# Patient Record
Sex: Female | Born: 1985 | Race: Black or African American | Hispanic: No | Marital: Married | Smoking: Never smoker
Health system: Southern US, Community
[De-identification: ages and names within clinical notes are randomized; demographics above are authoritative.]

## PROBLEM LIST (undated history)

## (undated) ENCOUNTER — Inpatient Hospital Stay (HOSPITAL_COMMUNITY): Payer: Self-pay

## (undated) DIAGNOSIS — O09819 Supervision of pregnancy resulting from assisted reproductive technology, unspecified trimester: Secondary | ICD-10-CM

## (undated) DIAGNOSIS — I10 Essential (primary) hypertension: Secondary | ICD-10-CM

## (undated) DIAGNOSIS — E785 Hyperlipidemia, unspecified: Secondary | ICD-10-CM

## (undated) DIAGNOSIS — D649 Anemia, unspecified: Secondary | ICD-10-CM

## (undated) DIAGNOSIS — I2699 Other pulmonary embolism without acute cor pulmonale: Secondary | ICD-10-CM

## (undated) DIAGNOSIS — I82409 Acute embolism and thrombosis of unspecified deep veins of unspecified lower extremity: Secondary | ICD-10-CM

## (undated) HISTORY — DX: Essential (primary) hypertension: I10

## (undated) HISTORY — DX: Acute embolism and thrombosis of unspecified deep veins of unspecified lower extremity: I82.409

## (undated) HISTORY — DX: Supervision of pregnancy resulting from assisted reproductive technology, unspecified trimester: O09.819

## (undated) HISTORY — DX: Hyperlipidemia, unspecified: E78.5

## (undated) HISTORY — PX: CHOLECYSTECTOMY: SHX55

## (undated) HISTORY — DX: Other pulmonary embolism without acute cor pulmonale: I26.99

---

## 2014-07-17 ENCOUNTER — Ambulatory Visit (INDEPENDENT_AMBULATORY_CARE_PROVIDER_SITE_OTHER): Payer: BC Managed Care – PPO | Admitting: Family Medicine

## 2014-07-17 VITALS — BP 139/85 | HR 91 | Temp 98.4°F | Resp 16 | Ht 65.5 in | Wt 164.6 lb

## 2014-07-17 DIAGNOSIS — Z113 Encounter for screening for infections with a predominantly sexual mode of transmission: Secondary | ICD-10-CM

## 2014-07-17 DIAGNOSIS — N898 Other specified noninflammatory disorders of vagina: Secondary | ICD-10-CM

## 2014-07-17 DIAGNOSIS — Z124 Encounter for screening for malignant neoplasm of cervix: Secondary | ICD-10-CM

## 2014-07-17 LAB — POCT WET PREP WITH KOH
KOH Prep POC: NEGATIVE
Trichomonas, UA: NEGATIVE
Yeast Wet Prep HPF POC: NEGATIVE

## 2014-07-17 NOTE — Progress Notes (Signed)
Chief Complaint:  Chief Complaint  Patient presents with  . Gynecologic Exam    possible skin tag    HPI: Melissa Ruiz is a 28 y.o. female who is here for  Pap Last pap was 1 year ago No abnormal pap G0 Sexually active, sometimes use condoms No sexually transmitted disease No vaginal discahrge, she ahs "something in her vagina" about 2 days ago, her boyfriend noticed it.  She is doing well, no sxs She is in amonagamous relationship   No past medical history on file. No past surgical history on file. History   Social History  . Marital Status: Married    Spouse Name: N/A    Number of Children: N/A  . Years of Education: N/A   Social History Main Topics  . Smoking status: Never Smoker   . Smokeless tobacco: None  . Alcohol Use: 0.0 oz/week    0 Not specified per week  . Drug Use: No  . Sexual Activity: Yes   Other Topics Concern  . None   Social History Narrative  . None   No family history on file. No Known Allergies Prior to Admission medications   Not on File     ROS: The patient denies fevers, chills, night sweats, unintentional weight loss, chest pain, palpitations, wheezing, dyspnea on exertion, nausea, vomiting, abdominal pain, dysuria, hematuria, melena, numbness, weakness, or tingling.   All other systems have been reviewed and were otherwise negative with the exception of those mentioned in the HPI and as above.    PHYSICAL EXAM: Filed Vitals:   07/17/14 1919  BP: 139/85  Pulse: 91  Temp: 98.4 F (36.9 C)  Resp: 16   Filed Vitals:   07/17/14 1919  Height: 5' 5.5" (1.664 m)  Weight: 164 lb 9.6 oz (74.662 kg)   Body mass index is 26.96 kg/(m^2).  General: Alert, no acute distress HEENT:  Normocephalic, atraumatic, oropharynx patent. EOMI, PERRLA Cardiovascular:  Regular rate and rhythm, no rubs murmurs or gallops.  No Carotid bruits, radial pulse intact. No pedal edema.  Respiratory: Clear to auscultation bilaterally.  No  wheezes, rales, or rhonchi.  No cyanosis, no use of accessory musculature GI: No organomegaly, abdomen is soft and non-tender, positive bowel sounds.  No masses. Skin: No rashes. Neurologic: Facial musculature symmetric. Psychiatric: Patient is appropriate throughout our interaction. Lymphatic: No cervical lymphadenopathy Musculoskeletal: Gait intact. GU-vaginal dc, white, no odor, she has a slight gray discolored 1 mm skintag that is not quite consistent with genital warts so will monitor Friable cervix, no CMT, no ulcers   LABS: Results for orders placed or performed in visit on 07/17/14  POCT Wet Prep with KOH  Result Value Ref Range   Trichomonas, UA Negative    Clue Cells Wet Prep HPF POC 1-3    Epithelial Wet Prep HPF POC 3-6    Yeast Wet Prep HPF POC neg    Bacteria Wet Prep HPF POC 2+    RBC Wet Prep HPF POC 0-2    WBC Wet Prep HPF POC 5-10    KOH Prep POC Negative      EKG/XRAY:   Primary read interpreted by Dr. Marin Comment at [facility].   ASSESSMENT/PLAN: Encounter Diagnoses  Name Primary?  . Screening for cervical cancer Yes  . Screening for STD (sexually transmitted disease)   . Vaginal discharge    Labs pending F/u after results are in by phone  Gross sideeffects, risk and benefits, and alternatives of medications  d/w patient. Patient is aware that all medications have potential sideeffects and we are unable to predict every sideeffect or drug-drug interaction that may occur.  LE, [place], DO 07/18/2014 3:43 AM

## 2014-07-18 LAB — RPR

## 2014-07-18 LAB — HIV ANTIBODY (ROUTINE TESTING W REFLEX): HIV 1&2 Ab, 4th Generation: NONREACTIVE

## 2014-07-19 LAB — PAP IG, CT-NG, RFX HPV ASCU
Chlamydia Probe Amp: NEGATIVE
GC Probe Amp: NEGATIVE

## 2014-07-19 LAB — HSV(HERPES SIMPLEX VRS) I + II AB-IGG
HSV 1 Glycoprotein G Ab, IgG: 10.33 IV — ABNORMAL HIGH
HSV 2 Glycoprotein G Ab, IgG: <0.1 IV

## 2014-07-22 ENCOUNTER — Encounter: Payer: Self-pay | Admitting: Family Medicine

## 2014-07-24 ENCOUNTER — Encounter: Payer: Self-pay | Admitting: Family Medicine

## 2014-09-16 DIAGNOSIS — I10 Essential (primary) hypertension: Secondary | ICD-10-CM

## 2014-09-16 HISTORY — DX: Essential (primary) hypertension: I10

## 2014-09-25 ENCOUNTER — Telehealth: Payer: Self-pay

## 2014-09-25 NOTE — Telephone Encounter (Signed)
Patient left voicemail on Tuesday at 6:22 pm requesting copies of medical records. Will need to sign ROI form. Cb# [phone].

## 2014-09-30 NOTE — Telephone Encounter (Signed)
Left voicemail for patient. Records ready and in 102 pick up drawer. Needs to sign release form when she comes to pick up records.

## 2014-12-06 DIAGNOSIS — K219 Gastro-esophageal reflux disease without esophagitis: Secondary | ICD-10-CM | POA: Insufficient documentation

## 2014-12-06 DIAGNOSIS — E785 Hyperlipidemia, unspecified: Secondary | ICD-10-CM | POA: Insufficient documentation

## 2015-08-10 DIAGNOSIS — I82409 Acute embolism and thrombosis of unspecified deep veins of unspecified lower extremity: Secondary | ICD-10-CM

## 2015-08-10 HISTORY — DX: Acute embolism and thrombosis of unspecified deep veins of unspecified lower extremity: I82.409

## 2015-09-10 DIAGNOSIS — I2699 Other pulmonary embolism without acute cor pulmonale: Secondary | ICD-10-CM

## 2015-09-10 HISTORY — DX: Other pulmonary embolism without acute cor pulmonale: I26.99

## 2015-10-10 DIAGNOSIS — D500 Iron deficiency anemia secondary to blood loss (chronic): Secondary | ICD-10-CM | POA: Insufficient documentation

## 2017-08-11 ENCOUNTER — Ambulatory Visit (HOSPITAL_COMMUNITY)
Admission: RE | Admit: 2017-08-11 | Discharge: 2017-08-11 | Disposition: A | Discharge status: Home / Self Care | Payer: 59 | Source: Ambulatory Visit | Attending: Cardiovascular Disease | Admitting: Cardiovascular Disease

## 2017-08-11 ENCOUNTER — Other Ambulatory Visit: Payer: Self-pay | Admitting: Family Medicine

## 2017-08-11 ENCOUNTER — Encounter: Payer: Self-pay | Admitting: Family Medicine

## 2017-08-11 ENCOUNTER — Ambulatory Visit (INDEPENDENT_AMBULATORY_CARE_PROVIDER_SITE_OTHER): Payer: 59 | Admitting: Family Medicine

## 2017-08-11 VITALS — BP 130/92 | HR 76 | Temp 98.9°F | Ht 64.5 in | Wt 154.7 lb

## 2017-08-11 DIAGNOSIS — E559 Vitamin D deficiency, unspecified: Secondary | ICD-10-CM

## 2017-08-11 DIAGNOSIS — M7989 Other specified soft tissue disorders: Principal | ICD-10-CM

## 2017-08-11 DIAGNOSIS — M79661 Pain in right lower leg: Secondary | ICD-10-CM | POA: Diagnosis not present

## 2017-08-11 DIAGNOSIS — E782 Mixed hyperlipidemia: Secondary | ICD-10-CM

## 2017-08-11 DIAGNOSIS — Z131 Encounter for screening for diabetes mellitus: Secondary | ICD-10-CM

## 2017-08-11 DIAGNOSIS — Z86718 Personal history of other venous thrombosis and embolism: Secondary | ICD-10-CM

## 2017-08-11 DIAGNOSIS — Z1329 Encounter for screening for other suspected endocrine disorder: Secondary | ICD-10-CM

## 2017-08-11 DIAGNOSIS — D509 Iron deficiency anemia, unspecified: Secondary | ICD-10-CM

## 2017-08-11 DIAGNOSIS — Z86711 Personal history of pulmonary embolism: Secondary | ICD-10-CM

## 2017-08-11 DIAGNOSIS — E049 Nontoxic goiter, unspecified: Secondary | ICD-10-CM | POA: Diagnosis not present

## 2017-08-11 DIAGNOSIS — R600 Localized edema: Secondary | ICD-10-CM

## 2017-08-11 DIAGNOSIS — Z0000 Encounter for general adult medical examination without abnormal findings: Secondary | ICD-10-CM

## 2017-08-11 DIAGNOSIS — I10 Essential (primary) hypertension: Secondary | ICD-10-CM

## 2017-08-11 LAB — BASIC METABOLIC PANEL WITH GFR
BUN: 13 mg/dL (ref 6–23)
CO2: 30 meq/L (ref 19–32)
Calcium: 9.6 mg/dL (ref 8.4–10.5)
Chloride: 102 meq/L (ref 96–112)
Creatinine, Ser: 1.01 mg/dL (ref 0.40–1.20)
GFR: 80.87 mL/min
Glucose, Bld: 75 mg/dL (ref 70–99)
Potassium: 4.2 meq/L (ref 3.5–5.1)
Sodium: 139 meq/L (ref 135–145)

## 2017-08-11 LAB — CBC WITH DIFFERENTIAL/PLATELET
BASOS ABS: 0 10*3/uL (ref 0.0–0.1)
Basophils Relative: 0.5 % (ref 0.0–3.0)
EOS ABS: 0.1 10*3/uL (ref 0.0–0.7)
Eosinophils Relative: 2.2 % (ref 0.0–5.0)
HCT: 39.7 % (ref 36.0–46.0)
Hemoglobin: 13.5 g/dL (ref 12.0–15.0)
LYMPHS ABS: 1.9 10*3/uL (ref 0.7–4.0)
Lymphocytes Relative: 42.5 % (ref 12.0–46.0)
MCHC: 34 g/dL (ref 30.0–36.0)
MCV: 95.4 fl (ref 78.0–100.0)
MONO ABS: 0.3 10*3/uL (ref 0.1–1.0)
Monocytes Relative: 6.2 % (ref 3.0–12.0)
NEUTROS ABS: 2.1 10*3/uL (ref 1.4–7.7)
NEUTROS PCT: 48.6 % (ref 43.0–77.0)
PLATELETS: 298 10*3/uL (ref 150.0–400.0)
RBC: 4.17 Mil/uL (ref 3.87–5.11)
RDW: 13 % (ref 11.5–15.5)
WBC: 4.4 10*3/uL (ref 4.0–10.5)

## 2017-08-11 LAB — LIPID PANEL
CHOL/HDL RATIO: 4
Cholesterol: 218 mg/dL — ABNORMAL HIGH (ref 0–200)
HDL: 56.9 mg/dL (ref >39.00)
LDL CALC: 145 mg/dL — AB (ref 0–99)
NonHDL: 160.7
Triglycerides: 77 mg/dL (ref 0.0–149.0)
VLDL: 15.4 mg/dL (ref 0.0–40.0)

## 2017-08-11 LAB — HEMOGLOBIN A1C: Hgb A1c MFr Bld: 5.5 % (ref 4.6–6.5)

## 2017-08-11 LAB — VITAMIN D 25 HYDROXY (VIT D DEFICIENCY, FRACTURES): VITD: 23.22 ng/mL — AB (ref 30.00–100.00)

## 2017-08-11 NOTE — Patient Instructions (Addendum)
Preventive Care 18-39 Years, Female Preventive care refers to lifestyle choices and visits with your health care provider that can promote health and wellness. What does preventive care include?  A yearly physical exam. This is also called an annual well check.  Dental exams once or twice a year.  Routine eye exams. Ask your health care provider how often you should have your eyes checked.  Personal lifestyle choices, including: ? Daily care of your teeth and gums. ? Regular physical activity. ? Eating a healthy diet. ? Avoiding tobacco and drug use. ? Limiting alcohol use. ? Practicing safe sex. ? Taking vitamin and mineral supplements as recommended by your health care provider. What happens during an annual well check? The services and screenings done by your health care provider during your annual well check will depend on your age, overall health, lifestyle risk factors, and family history of disease. Counseling Your health care provider may ask you questions about your:  Alcohol use.  Tobacco use.  Drug use.  Emotional well-being.  Home and relationship well-being.  Sexual activity.  Eating habits.  Work and work Statistician.  Method of birth control.  Menstrual cycle.  Pregnancy history.  Screening You may have the following tests or measurements:  Height, weight, and BMI.  Diabetes screening. This is done by checking your blood sugar (glucose) after you have not eaten for a while (fasting).  Blood pressure.  Lipid and cholesterol levels. These may be checked every 5 years starting at age 66.  Skin check.  Hepatitis C blood test.  Hepatitis B blood test.  Sexually transmitted disease (STD) testing.  BRCA-related cancer screening. This may be done if you have a family history of breast, ovarian, tubal, or peritoneal cancers.  Pelvic exam and Pap test. This may be done every 3 years starting at age 40. Starting at age 59, this may be done every 5  years if you have a Pap test in combination with an HPV test.  Discuss your test results, treatment options, and if necessary, the need for more tests with your health care provider. Vaccines Your health care provider may recommend certain vaccines, such as:  Influenza vaccine. This is recommended every year.  Tetanus, diphtheria, and acellular pertussis (Tdap, Td) vaccine. You may need a Td booster every 10 years.  Varicella vaccine. You may need this if you have not been vaccinated.  HPV vaccine. If you are 69 or younger, you may need three doses over 6 months.  Measles, mumps, and rubella (MMR) vaccine. You may need at least one dose of MMR. You may also need a second dose.  Pneumococcal 13-valent conjugate (PCV13) vaccine. You may need this if you have certain conditions and were not previously vaccinated.  Pneumococcal polysaccharide (PPSV23) vaccine. You may need one or two doses if you smoke cigarettes or if you have certain conditions.  Meningococcal vaccine. One dose is recommended if you are age 27-21 years and a first-year college student living in a residence hall, or if you have one of several medical conditions. You may also need additional booster doses.  Hepatitis A vaccine. You may need this if you have certain conditions or if you travel or work in places where you may be exposed to hepatitis A.  Hepatitis B vaccine. You may need this if you have certain conditions or if you travel or work in places where you may be exposed to hepatitis B.  Haemophilus influenzae type b (Hib) vaccine. You may need this if  you have certain risk factors.  Talk to your health care provider about which screenings and vaccines you need and how often you need them. This information is not intended to replace advice given to you by your health care provider. Make sure you discuss any questions you have with your health care provider. Document Released: 09/21/2001 Document Revised: 04/14/2016  Document Reviewed: 05/27/2015 Elsevier Interactive Patient Education  2018 [employer].  Managing Your Hypertension Hypertension is commonly called high blood pressure. This is when the force of your blood pressing against the walls of your arteries is too strong. Arteries are blood vessels that carry blood from your heart throughout your body. Hypertension forces the heart to work harder to pump blood, and may cause the arteries to become narrow or stiff. Having untreated or uncontrolled hypertension can cause heart attack, stroke, kidney disease, and other problems. What are blood pressure readings? A blood pressure reading consists of a higher number over a lower number. Ideally, your blood pressure should be below 120/80. The first ("top") number is called the systolic pressure. It is a measure of the pressure in your arteries as your heart beats. The second ("bottom") number is called the diastolic pressure. It is a measure of the pressure in your arteries as the heart relaxes. What does my blood pressure reading mean? Blood pressure is classified into four stages. Based on your blood pressure reading, your health care provider may use the following stages to determine what type of treatment you need, if any. Systolic pressure and diastolic pressure are measured in a unit called mm Hg. Normal  Systolic pressure: below 034.  Diastolic pressure: below 80. Elevated  Systolic pressure: 742-595.  Diastolic pressure: below 80. Hypertension stage 1  Systolic pressure: 638-756.  Diastolic pressure: 43-32. Hypertension stage 2  Systolic pressure: 951 or above.  Diastolic pressure: 90 or above. What health risks are associated with hypertension? Managing your hypertension is an important responsibility. Uncontrolled hypertension can lead to:  A heart attack.  A stroke.  A weakened blood vessel (aneurysm).  Heart failure.  Kidney damage.  Eye damage.  Metabolic  syndrome.  Memory and concentration problems.  What changes can I make to manage my hypertension? Hypertension can be managed by making lifestyle changes and possibly by taking medicines. Your health care provider will help you make a plan to bring your blood pressure within a normal range. Eating and drinking  Eat a diet that is high in fiber and potassium, and low in salt (sodium), added sugar, and fat. An example eating plan is called the DASH (Dietary Approaches to Stop Hypertension) diet. To eat this way: ? Eat plenty of fresh fruits and vegetables. Try to fill half of your plate at each meal with fruits and vegetables. ? Eat whole grains, such as whole wheat pasta, brown rice, or whole grain bread. Fill about one quarter of your plate with whole grains. ? Eat low-fat diary products. ? Avoid fatty cuts of meat, processed or cured meats, and poultry with skin. Fill about one quarter of your plate with lean proteins such as fish, chicken without skin, beans, eggs, and tofu. ? Avoid premade and processed foods. These tend to be higher in sodium, added sugar, and fat.  Reduce your daily sodium intake. Most people with hypertension should eat less than 1,500 mg of sodium a day.  Limit alcohol intake to no more than 1 drink a day for nonpregnant women and 2 drinks a day for men. One drink  equals 12 oz of beer, 5 oz of wine, or 1 oz of hard liquor. Lifestyle  Work with your health care provider to maintain a healthy body weight, or to lose weight. Ask what an ideal weight is for you.  Get at least 30 minutes of exercise that causes your heart to beat faster (aerobic exercise) most days of the week. Activities may include walking, swimming, or biking.  Include exercise to strengthen your muscles (resistance exercise), such as weight lifting, as part of your weekly exercise routine. Try to do these types of exercises for 30 minutes at least 3 days a week.  Do not use any products that contain  nicotine or tobacco, such as cigarettes and e-cigarettes. If you need help quitting, ask your health care provider.  Control any long-term (chronic) conditions you have, such as high cholesterol or diabetes. Monitoring  Monitor your blood pressure at home as told by your health care provider. Your personal target blood pressure may vary depending on your medical conditions, your age, and other factors.  Have your blood pressure checked regularly, as often as told by your health care provider. Working with your health care provider  Review all the medicines you take with your health care provider because there may be side effects or interactions.  Talk with your health care provider about your diet, exercise habits, and other lifestyle factors that may be contributing to hypertension.  Visit your health care provider regularly. Your health care provider can help you create and adjust your plan for managing hypertension. Will I need medicine to control my blood pressure? Your health care provider may prescribe medicine if lifestyle changes are not enough to get your blood pressure under control, and if:  Your systolic blood pressure is 130 or higher.  Your diastolic blood pressure is 80 or higher.  Take medicines only as told by your health care provider. Follow the directions carefully. Blood pressure medicines must be taken as prescribed. The medicine does not work as well when you skip doses. Skipping doses also puts you at risk for problems. Contact a health care provider if:  You think you are having a reaction to medicines you have taken.  You have repeated (recurrent) headaches.  You feel dizzy.  You have swelling in your ankles.  You have trouble with your vision. Get help right away if:  You develop a severe headache or confusion.  You have unusual weakness or numbness, or you feel faint.  You have severe pain in your chest or abdomen.  You vomit repeatedly.  You  have trouble breathing. Summary  Hypertension is when the force of blood pumping through your arteries is too strong. If this condition is not controlled, it may put you at risk for serious complications.  Your personal target blood pressure may vary depending on your medical conditions, your age, and other factors. For most people, a normal blood pressure is less than 120/80.  Hypertension is managed by lifestyle changes, medicines, or both. Lifestyle changes include weight loss, eating a healthy, low-sodium diet, exercising more, and limiting alcohol. This information is not intended to replace advice given to you by your health care provider. Make sure you discuss any questions you have with your health care provider. Document Released: 04/19/2012 Document Revised: 06/23/2016 Document Reviewed: 06/23/2016 Elsevier Interactive Patient Education  2018 [place] Eating Plan DASH stands for "Dietary Approaches to Stop Hypertension." The DASH eating plan is a healthy eating plan that has been shown  to reduce high blood pressure (hypertension). It may also reduce your risk for type 2 diabetes, heart disease, and stroke. The DASH eating plan may also help with weight loss. What are tips for following this plan? General guidelines  Avoid eating more than 2,300 mg (milligrams) of salt (sodium) a day. If you have hypertension, you may need to reduce your sodium intake to 1,500 mg a day.  Limit alcohol intake to no more than 1 drink a day for nonpregnant women and 2 drinks a day for men. One drink equals 12 oz of beer, 5 oz of wine, or 1 oz of hard liquor.  Work with your health care provider to maintain a healthy body weight or to lose weight. Ask what an ideal weight is for you.  Get at least 30 minutes of exercise that causes your heart to beat faster (aerobic exercise) most days of the week. Activities may include walking, swimming, or biking.  Work with your health care provider or  diet and nutrition specialist (dietitian) to adjust your eating plan to your individual calorie needs. Reading food labels  Check food labels for the amount of sodium per serving. Choose foods with less than 5 percent of the Daily Value of sodium. Generally, foods with less than 300 mg of sodium per serving fit into this eating plan.  To find whole grains, look for the word "whole" as the first word in the ingredient list. Shopping  Buy products labeled as "low-sodium" or "no salt added."  Buy fresh foods. Avoid canned foods and premade or frozen meals. Cooking  Avoid adding salt when cooking. Use salt-free seasonings or herbs instead of table salt or sea salt. Check with your health care provider or pharmacist before using salt substitutes.  Do not fry foods. Cook foods using healthy methods such as baking, boiling, grilling, and broiling instead.  Cook with heart-healthy oils, such as olive, canola, soybean, or sunflower oil. Meal planning   Eat a balanced diet that includes: ? 5 or more servings of fruits and vegetables each day. At each meal, try to fill half of your plate with fruits and vegetables. ? Up to 6-8 servings of whole grains each day. ? Less than 6 oz of lean meat, poultry, or fish each day. A 3-oz serving of meat is about the same size as a deck of cards. One egg equals 1 oz. ? 2 servings of low-fat dairy each day. ? A serving of nuts, seeds, or beans 5 times each week. ? Heart-healthy fats. Healthy fats called Omega-3 fatty acids are found in foods such as flaxseeds and coldwater fish, like sardines, salmon, and mackerel.  Limit how much you eat of the following: ? Canned or prepackaged foods. ? Food that is high in trans fat, such as fried foods. ? Food that is high in saturated fat, such as fatty meat. ? Sweets, desserts, sugary drinks, and other foods with added sugar. ? Full-fat dairy products.  Do not salt foods before eating.  Try to eat at least 2  vegetarian meals each week.  Eat more home-cooked food and less restaurant, buffet, and fast food.  When eating at a restaurant, ask that your food be prepared with less salt or no salt, if possible. What foods are recommended? The items listed may not be a complete list. Talk with your dietitian about what dietary choices are best for you. Grains Whole-grain or whole-wheat bread. Whole-grain or whole-wheat pasta. Brown rice. [place]. Bulgur. Whole-grain and low-sodium  cereals. Pita bread. Low-fat, low-sodium crackers. Whole-wheat flour tortillas. Vegetables Fresh or frozen vegetables (raw, steamed, roasted, or grilled). Low-sodium or reduced-sodium tomato and vegetable juice. Low-sodium or reduced-sodium tomato sauce and tomato paste. Low-sodium or reduced-sodium canned vegetables. Fruits All fresh, dried, or frozen fruit. Canned fruit in natural juice (without added sugar). Meat and other protein foods Skinless chicken or [place]. Ground chicken or [place]. Pork with fat trimmed off. Fish and seafood. Egg whites. Dried beans, peas, or lentils. Unsalted nuts, nut butters, and seeds. Unsalted canned beans. Lean cuts of beef with fat trimmed off. Low-sodium, lean deli meat. Dairy Low-fat (1%) or fat-free (skim) milk. Fat-free, low-fat, or reduced-fat cheeses. Nonfat, low-sodium ricotta or cottage cheese. Low-fat or nonfat yogurt. Low-fat, low-sodium cheese. Fats and oils Soft margarine without trans fats. Vegetable oil. Low-fat, reduced-fat, or light mayonnaise and salad dressings (reduced-sodium). Canola, safflower, olive, soybean, and sunflower oils. Avocado. Seasoning and other foods Herbs. Spices. Seasoning mixes without salt. Unsalted popcorn and pretzels. Fat-free sweets. What foods are not recommended? The items listed may not be a complete list. Talk with your dietitian about what dietary choices are best for you. Grains Baked goods made with fat, such as croissants, muffins, or  some breads. Dry pasta or rice meal packs. Vegetables Creamed or fried vegetables. Vegetables in a cheese sauce. Regular canned vegetables (not low-sodium or reduced-sodium). Regular canned tomato sauce and paste (not low-sodium or reduced-sodium). Regular tomato and vegetable juice (not low-sodium or reduced-sodium). Angie Fava. Olives. Fruits Canned fruit in a light or heavy syrup. Fried fruit. Fruit in cream or butter sauce. Meat and other protein foods Fatty cuts of meat. Ribs. Fried meat. Berniece Salines. Sausage. Bologna and other processed lunch meats. Salami. Fatback. Hotdogs. Bratwurst. Salted nuts and seeds. Canned beans with added salt. Canned or smoked fish. Whole eggs or egg yolks. Chicken or [place] with skin. Dairy Whole or 2% milk, cream, and half-and-half. Whole or full-fat cream cheese. Whole-fat or sweetened yogurt. Full-fat cheese. Nondairy creamers. Whipped toppings. Processed cheese and cheese spreads. Fats and oils Butter. Stick margarine. Lard. Shortening. Ghee. Bacon fat. Tropical oils, such as coconut, palm kernel, or palm oil. Seasoning and other foods Salted popcorn and pretzels. Onion salt, garlic salt, seasoned salt, table salt, and sea salt. Worcestershire sauce. Tartar sauce. Barbecue sauce. Teriyaki sauce. Soy sauce, including reduced-sodium. Steak sauce. Canned and packaged gravies. Fish sauce. Oyster sauce. Cocktail sauce. Horseradish that you find on the shelf. Ketchup. Mustard. Meat flavorings and tenderizers. Bouillon cubes. Hot sauce and Tabasco sauce. Premade or packaged marinades. Premade or packaged taco seasonings. Relishes. Regular salad dressings. Where to find more information:  National Heart, Lung, and [place]: https://wilson-eaton.com/  American Heart Association: www.heart.org Summary  The DASH eating plan is a healthy eating plan that has been shown to reduce high blood pressure (hypertension). It may also reduce your risk for type 2 diabetes, heart disease,  and stroke.  With the DASH eating plan, you should limit salt (sodium) intake to 2,300 mg a day. If you have hypertension, you may need to reduce your sodium intake to 1,500 mg a day.  When on the DASH eating plan, aim to eat more fresh fruits and vegetables, whole grains, lean proteins, low-fat dairy, and heart-healthy fats.  Work with your health care provider or diet and nutrition specialist (dietitian) to adjust your eating plan to your individual calorie needs. This information is not intended to replace advice given to you by your health care provider. Make sure you discuss any  questions you have with your health care provider. Document Released: 07/15/2011 Document Revised: 07/19/2016 Document Reviewed: 07/19/2016 Elsevier Interactive Patient Education  [employer].

## 2017-08-11 NOTE — Progress Notes (Signed)
Patient presents to clinic today to for CPE and f/u on chronic issues.  SUBJECTIVE: PMH:  Pt is a 32 yo with pmh sig for HTN, HLD, history of PE/DVT, GERD, allergies-dust.  Patient was formally seen at [facility] in [place], [place].  Her previous provider was Dr. Feliberto Gottron.  HTN: -Patient was previously on HCTZ 12.5, however this was discontinued secondary to dizziness -Patient monitors BP at home.  States averages 120/80 -Patient does not wish to restart medication at this time  HLD: -Patient states cholesterol is always high -Pt does not wish to take statins -Patient is not currently exercising  History of PE/DVT: -Occurred in 2017.  Started with right ankle pain which progressed to SOB after a flight to [place] to [place]. -Patient was on OCPs at the time -She was started on Xarelto times 6 months.  Currently on anticoagulation therapy -Hypercoag workup was negative -Patient endorses right thigh swelling from time to time.  In the past Dopplers have been negative  Allergies: NKDA, NKFA  Past surgical history: Cholecystectomy January 2017  Social history: Patient recently relocated from [place] to [place] area for work.  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  Patient's husband is the nephrologist.  Patient would like to become pregnant soon, is taking prenatal vitamins.  LMP 08/07/17.  Last Pap 2017.  Patient does not use tobacco products nor drugs.  Patient endorses social ethanol use.   Family medical history: Mom-HLD, HTN Dad-cancer, DM, HLD, HTN Sister-HLD MGM- MGF-early death PGM-DM PGF-early death  Health Maintenance: Immunizations --last tetanus 2018, TB test, 2018, influenza vaccine 2018 PAP -- 2017   Past Medical History:  Diagnosis Date  . DVT (deep vein thrombosis) in pregnancy ([place])   . Hyperlipidemia   . Pulmonary embolism ([place]) 09/2015    Past Surgical History:  Procedure Laterality Date  . CHOLECYSTECTOMY      Current  Outpatient Medications on File Prior to Visit  Medication Sig Dispense Refill  . albuterol (PROVENTIL HFA;VENTOLIN HFA) 108 (90 Base) MCG/ACT inhaler Inhale into the lungs.    . Prenatal MV-Min-Fe Fum-FA-DHA (PRENATAL 1 PO) Take by mouth.     No current facility-administered medications on file prior to visit.     No Known Allergies  History reviewed. No pertinent family history.  Social History   Socioeconomic History  . Marital status: Married    Spouse name: Not on file  . Number of children: Not on file  . Years of education: Not on file  . Highest education level: Not on file  Social Needs  . Financial resource strain: Not on file  . Food insecurity - worry: Not on file  . Food insecurity - inability: Not on file  . Transportation needs - medical: Not on file  . Transportation needs - non-medical: Not on file  Occupational History  . Not on file  Tobacco Use  . Smoking status: Never Smoker  . Smokeless tobacco: Never Used  Substance and Sexual Activity  . Alcohol use: Yes    Alcohol/week: 0.0 oz    Comment: rarely  . Drug use: No  . Sexual activity: Yes  Other Topics Concern  . Not on file  Social History Narrative  . Not on file    ROS General: Denies fever, chills, night sweats, changes in weight, changes in appetite HEENT: Denies headaches, ear pain, changes in vision, rhinorrhea, sore throat CV: Denies CP, palpitations, SOB, orthopnea Pulm: Denies SOB, cough, wheezing GI: Denies abdominal pain,  nausea, vomiting, diarrhea, constipation GU: Denies dysuria, hematuria, frequency, vaginal discharge Msk: Denies muscle cramps, joint pains  +R thigh edema Neuro: Denies weakness, numbness, tingling Skin: Denies rashes, bruising Psych: Denies depression, anxiety, hallucinations  BP (!) 130/92 (BP Location: Right Arm, Patient Position: Sitting, Cuff Size: Normal)   Pulse 76   Temp 98.9 F (37.2 C) (Oral)   Ht 5' 4.5" (1.638 m)   Wt 154 lb 11.2 oz (70.2 kg)    BMI 26.14 kg/m   Physical Exam Gen. Pleasant, well developed, well-nourished, in NAD HEENT - Tome/AT, PERRL, no nasal drainage, pharynx without erythema or exudate. Neck: No JVD, goiter-R side of thyroid enlarged Lungs: no use of accessory muscles, CTAB, no wheezes, rales or rhonchi Cardiovascular: RRR, No r/g/m, no peripheral edema Abdomen: Keloids present s/p lap chole.  BS present, soft, nontender,nondistended, no hepatosplenomegaly Musculoskeletal: No deformities, moves all four extremities, no cyanosis or clubbing, normal tone.  Right thigh > L thigh, nontender, no cords appreciated. Neuro:  A&Ox3, CN II-XII intact, normal gait Skin:  Warm, dry, intact, no lesions Psych: normal affect, mood appropriate  No results found for this or any previous visit (from the past 2160 hour(s)).  Assessment/Plan: Well adult exam -anticipatory guidance given  Including wearing seatbelts, smoke detectors in the home, increasing water, increasing vegetables, increasing physical activity. -Patient to have Pap done by OB/GYN in the coming weeks. -Will obtain labs today -Also discussed health maintenance.  Essential hypertension  -Mildly elevated, 130/92 -We will have patient check BP at home and keep log. -Lifestyle modifications encouraged as patient is trying to avoid medication. - Plan: Basic metabolic panel.      BMP normal-patient notified via phone -We will reevaluate in 1 month, sooner if needed  Mixed hyperlipidemia -Discussed lifestyle modifications as cholesterol has been high in the past and patient wishes to avoid statins. - Plan: Lipid panel -Update: Patient called to inform her of lab results including total cholesterol 218, HDL 56.9 and LDL 145.  Patient encouraged to increase intake of whole grains, increase physical activity, decrease saturated fats.  History of pulmonary embolus (PE) -not currently on anticoagulation therapy as completed 6 months of therapy and has been DVT  free.  History of DVT (deep vein thrombosis)  -not currently on anticoagulation therapy as completed 6 months of therapy and has been DVT free. -will continue to monitor -We will obtain Dopplers given edema of the right thigh. -Continue to wear compression stockings daily.  Vitamin D deficiency  - Plan: Vitamin D, 25-hydroxy -Update: Patient notified of vitamin D insufficiency, levels at 23.2.  Rx for ergocalciferol 50,000 IU sent to patient's pharmacy.  We will treat for 12 weeks.  Patient made aware  Iron deficiency anemia, unspecified iron deficiency anemia type  -Will obtain labs. -In the past pt has required IV Iron.  If needed will place referral for this. - Plan: CBC with Differential/Platelet, Iron, TIBC and Ferritin Panel, CBC with Differential/Platelet -Update: Patient informed of normal iron panel and hemoglobin of 13.5 via phone call  Screening for diabetes mellitus  - Plan: Hemoglobin A1c -Patient informed of hemoglobin A1c of 5.5 via phone call.  Thigh edema  -Given history of asymptomatic DVT/PE we will proceed with Doppler of right thigh. -Patient advised to wear compression hose while working -Patient advised SOB to proceed to the ED for further evaluation. - Plan: CANCELED: DOPPLER VENOUS LEGS BILATERAL, CANCELED: DOPPLER VENOUS LEGS BILATERAL -Update: Doppler of bilateral legs negative.  Patient informed via phone call  on 08/12/17 at 6:26 PM.  Goiter -Previously noted, unchanged per patient -As has previously undergone ultrasound will watch. -Patient advised if thyroid becomes enlarged further we will repeat ultrasound.   Follow-up in 3 month, sooner if needed.    Patient made aware of lab results via phone call on 08/12/17. Grier Mitts, MD

## 2017-08-12 ENCOUNTER — Encounter: Payer: Self-pay | Admitting: Family Medicine

## 2017-08-12 DIAGNOSIS — E559 Vitamin D deficiency, unspecified: Secondary | ICD-10-CM

## 2017-08-12 DIAGNOSIS — Z8639 Personal history of other endocrine, nutritional and metabolic disease: Secondary | ICD-10-CM | POA: Insufficient documentation

## 2017-08-12 DIAGNOSIS — Z86711 Personal history of pulmonary embolism: Secondary | ICD-10-CM | POA: Insufficient documentation

## 2017-08-12 DIAGNOSIS — E049 Nontoxic goiter, unspecified: Secondary | ICD-10-CM | POA: Insufficient documentation

## 2017-08-12 DIAGNOSIS — Z86718 Personal history of other venous thrombosis and embolism: Secondary | ICD-10-CM | POA: Insufficient documentation

## 2017-08-12 HISTORY — DX: Vitamin D deficiency, unspecified: E55.9

## 2017-08-12 LAB — IRON,TIBC AND FERRITIN PANEL
%SAT: 29 % (calc) (ref 11–50)
FERRITIN: 125 ng/mL (ref 10–154)
Iron: 103 ug/dL (ref 40–190)
TIBC: 361 ug/dL (ref 250–450)

## 2017-08-12 MED ORDER — VITAMIN D (ERGOCALCIFEROL) 1.25 MG (50000 UNIT) PO CAPS: 50000.0000 [IU] | ORAL_CAPSULE | ORAL | 0 refills | Status: DC

## 2017-08-17 ENCOUNTER — Other Ambulatory Visit: Payer: Self-pay | Admitting: Family Medicine

## 2017-08-17 DIAGNOSIS — Z1329 Encounter for screening for other suspected endocrine disorder: Secondary | ICD-10-CM

## 2017-08-29 ENCOUNTER — Encounter: Payer: Self-pay | Admitting: Obstetrics & Gynecology

## 2017-08-29 ENCOUNTER — Ambulatory Visit (INDEPENDENT_AMBULATORY_CARE_PROVIDER_SITE_OTHER): Payer: 59 | Admitting: Obstetrics & Gynecology

## 2017-08-29 VITALS — BP 133/82 | HR 97 | Ht 65.0 in | Wt 156.0 lb

## 2017-08-29 DIAGNOSIS — Z1151 Encounter for screening for human papillomavirus (HPV): Secondary | ICD-10-CM | POA: Diagnosis not present

## 2017-08-29 DIAGNOSIS — Z01419 Encounter for gynecological examination (general) (routine) without abnormal findings: Secondary | ICD-10-CM | POA: Diagnosis not present

## 2017-08-29 DIAGNOSIS — N979 Female infertility, unspecified: Secondary | ICD-10-CM

## 2017-08-29 DIAGNOSIS — Z124 Encounter for screening for malignant neoplasm of cervix: Secondary | ICD-10-CM | POA: Diagnosis not present

## 2017-08-29 DIAGNOSIS — F411 Generalized anxiety disorder: Secondary | ICD-10-CM | POA: Diagnosis not present

## 2017-08-29 NOTE — Progress Notes (Addendum)
GYNECOLOGY ANNUAL PREVENTATIVE CARE ENCOUNTER NOTE  Subjective:   Melissa Ruiz is a 32 y.o. G5 female here for a routine annual gynecologic exam. 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 Accompanied by her husband who is a nephrologist at [employer] [facility].  Current complaints: has been trying to conceive for about one year, has used ovulation predictor kits which show she ovulates but to no success. Has regular menstrual cycles.  Husband has not been evaluated, no other children.   Denies current abnormal vaginal bleeding, discharge, pelvic pain, problems with intercourse or other gynecologic concerns.    Gynecologic History Patient's last menstrual period was 08/08/2017 (approximate). Contraception: none Last Pap: 2015. Results were: normal   Obstetric History OB History  Gravida Para Term Preterm AB Living  0 0 0 0 0 0  SAB TAB Ectopic Multiple Live Births  0 0 0 0 0    Past Medical History:  Diagnosis Date  . DVT (deep venous thrombosis) ([place])   . Hyperlipidemia   . Pulmonary embolism ([place]) 09/2015    Past Surgical History:  Procedure Laterality Date  . CHOLECYSTECTOMY      Current Outpatient Medications on File Prior to Visit  Medication Sig Dispense Refill  . Prenatal MV-Min-Fe Fum-FA-DHA (PRENATAL 1 PO) Take by mouth.    Marland Kitchen albuterol (PROVENTIL HFA;VENTOLIN HFA) 108 (90 Base) MCG/ACT inhaler Inhale into the lungs.    . Vitamin D, Ergocalciferol, (DRISDOL) 50000 units CAPS capsule Take 1 capsule (50,000 Units total) by mouth every 7 (seven) days. (Patient not taking: Reported on 08/29/2017) 12 capsule 0   No current facility-administered medications on file prior to visit.     No Known Allergies  Social History   Socioeconomic History  . Marital status: Married    Spouse name: Not on file  . Number of children: Not on file  . Years of education: Not on file  . Highest education level: Not on file  Social Needs  . Financial resource  strain: Not on file  . Food insecurity - worry: Not on file  . Food insecurity - inability: Not on file  . Transportation needs - medical: Not on file  . Transportation needs - non-medical: Not on file  Occupational History  . Not on file  Tobacco Use  . Smoking status: Never Smoker  . Smokeless tobacco: Never Used  Substance and Sexual Activity  . Alcohol use: Yes    Alcohol/week: 0.0 oz    Comment: rarely  . Drug use: No  . Sexual activity: Yes  Other Topics Concern  . Not on file  Social History Narrative  . Not on file    No family history on file.  The following portions of the patient's history were reviewed and updated as appropriate: allergies, current medications, past family history, past medical history, past social history, past surgical history and problem list.  Review of Systems Pertinent items noted in HPI and remainder of comprehensive ROS otherwise negative.   Objective:  BP 133/82   Pulse 97   Ht 5\' 5"  (1.651 m)   Wt 156 lb (70.8 kg)   LMP 08/08/2017 (Approximate)   BMI 25.96 kg/m  CONSTITUTIONAL: Well-developed, well-nourished female in no acute distress.  HENT:  Normocephalic, atraumatic, External right and left ear normal. Oropharynx is clear and moist EYES: Conjunctivae and EOM are normal. Pupils are equal, round, and reactive to light. No scleral icterus.  NECK: Normal range of motion, supple, no masses.  Normal thyroid.  SKIN: Skin is warm and dry. No rash noted. Not diaphoretic. No erythema. No pallor. NEUROLOGIC: Alert and oriented to person, place, and time. Normal reflexes, muscle tone coordination. No cranial nerve deficit noted. PSYCHIATRIC: Normal mood and affect. Normal behavior. Normal judgment and thought content. CARDIOVASCULAR: Normal heart rate noted, regular rhythm RESPIRATORY: Clear to auscultation bilaterally. Effort and breath sounds normal, no problems with respiration noted. BREASTS: Symmetric in size. No masses, skin changes,  nipple drainage, or lymphadenopathy. ABDOMEN: Soft, normal bowel sounds, no distention noted.  No tenderness, rebound or guarding.  PELVIC: Normal appearing external genitalia; normal appearing vaginal mucosa and cervix.  No abnormal discharge noted.  Pap smear obtained. Has prominent ectropion, bled significantly after Pap, silver nitrate used.  Normal uterine size,retroverted,  no other palpable masses, no uterine or adnexal tenderness. MUSCULOSKELETAL: Normal range of motion. No tenderness.  No cyanosis, clubbing, or edema.  2+ distal pulses.   Assessment and Plan:  1. Women's annual routine gynecological examination Will follow up results of pap smear and manage accordingly. - Cytology - PAP  2. Infertility, female Evaluation initiated as per patient and her husband's request.  Husband to get semen analysis. Will follow up results and manage accordingly. - Follicle stimulating hormone - Testosterone,Free and Total - Prolactin - TSH - Anti mullerian hormone - DG Hysterogram (HSG); Future - Beta HCG, Quant  Routine preventative health maintenance measures emphasized. Please refer to After Visit Summary for other counseling recommendations.    [name], MD, [place] Attending Obstetrician & Gynecologist, [place] for [facility]

## 2017-08-29 NOTE — Patient Instructions (Signed)
Preventive Care 18-39 Years, Female Preventive care refers to lifestyle choices and visits with your health care provider that can promote health and wellness. What does preventive care include?  A yearly physical exam. This is also called an annual well check.  Dental exams once or twice a year.  Routine eye exams. Ask your health care provider how often you should have your eyes checked.  Personal lifestyle choices, including: ? Daily care of your teeth and gums. ? Regular physical activity. ? Eating a healthy diet. ? Avoiding tobacco and drug use. ? Limiting alcohol use. ? Practicing safe sex. ? Taking vitamin and mineral supplements as recommended by your health care provider. What happens during an annual well check? The services and screenings done by your health care provider during your annual well check will depend on your age, overall health, lifestyle risk factors, and family history of disease. Counseling Your health care provider may ask you questions about your:  Alcohol use.  Tobacco use.  Drug use.  Emotional well-being.  Home and relationship well-being.  Sexual activity.  Eating habits.  Work and work Statistician.  Method of birth control.  Menstrual cycle.  Pregnancy history.  Screening You may have the following tests or measurements:  Height, weight, and BMI.  Diabetes screening. This is done by checking your blood sugar (glucose) after you have not eaten for a while (fasting).  Blood pressure.  Lipid and cholesterol levels. These may be checked every 5 years starting at age 62.  Skin check.  Hepatitis C blood test.  Hepatitis B blood test.  Sexually transmitted disease (STD) testing.  BRCA-related cancer screening. This may be done if you have a family history of breast, ovarian, tubal, or peritoneal cancers.  Pelvic exam and Pap test. This may be done every 3 years starting at age 82. Starting at age 85, this may be done every 5  years if you have a Pap test in combination with an HPV test.  Discuss your test results, treatment options, and if necessary, the need for more tests with your health care provider. Vaccines Your health care provider may recommend certain vaccines, such as:  Influenza vaccine. This is recommended every year.  Tetanus, diphtheria, and acellular pertussis (Tdap, Td) vaccine. You may need a Td booster every 10 years.  Varicella vaccine. You may need this if you have not been vaccinated.  HPV vaccine. If you are 31 or younger, you may need three doses over 6 months.  Measles, mumps, and rubella (MMR) vaccine. You may need at least one dose of MMR. You may also need a second dose.  Pneumococcal 13-valent conjugate (PCV13) vaccine. You may need this if you have certain conditions and were not previously vaccinated.  Pneumococcal polysaccharide (PPSV23) vaccine. You may need one or two doses if you smoke cigarettes or if you have certain conditions.  Meningococcal vaccine. One dose is recommended if you are age 34-21 years and a first-year college student living in a residence hall, or if you have one of several medical conditions. You may also need additional booster doses.  Hepatitis A vaccine. You may need this if you have certain conditions or if you travel or work in places where you may be exposed to hepatitis A.  Hepatitis B vaccine. You may need this if you have certain conditions or if you travel or work in places where you may be exposed to hepatitis B.  Haemophilus influenzae type b (Hib) vaccine. You may need this if  you have certain risk factors.  Talk to your health care provider about which screenings and vaccines you need and how often you need them. This information is not intended to replace advice given to you by your health care provider. Make sure you discuss any questions you have with your health care provider. Document Released: 09/21/2001 Document Revised: 04/14/2016  Document Reviewed: 05/27/2015 Elsevier Interactive Patient Education  [employer].

## 2017-08-30 LAB — CYTOLOGY - PAP
Bacterial vaginitis: NEGATIVE
CANDIDA VAGINITIS: NEGATIVE
Diagnosis: NEGATIVE
HPV (WINDOPATH): NOT DETECTED

## 2017-09-03 LAB — FOLLICLE STIMULATING HORMONE: FSH: 1.7 m[IU]/mL

## 2017-09-03 LAB — BETA HCG QUANT (REF LAB)

## 2017-09-03 LAB — PROLACTIN: Prolactin: 55.9 ng/mL — ABNORMAL HIGH (ref 4.8–23.3)

## 2017-09-03 LAB — TSH: TSH: 2.45 u[IU]/mL (ref 0.450–4.500)

## 2017-09-03 LAB — ANTI MULLERIAN HORMONE: ANTI-MULLERIAN HORMONE (AMH): 5.36 ng/mL

## 2017-09-03 LAB — TESTOSTERONE,FREE AND TOTAL
Testosterone, Free: 1.5 pg/mL (ref 0.0–4.2)
Testosterone: 25 ng/dL (ref 8–48)

## 2017-09-07 ENCOUNTER — Ambulatory Visit (HOSPITAL_COMMUNITY)
Admission: RE | Admit: 2017-09-07 | Discharge: 2017-09-07 | Disposition: A | Discharge status: Home / Self Care | Payer: 59 | Source: Ambulatory Visit | Attending: Obstetrics & Gynecology | Admitting: Obstetrics & Gynecology

## 2017-09-07 ENCOUNTER — Encounter: Payer: Self-pay | Admitting: Family Medicine

## 2017-09-07 DIAGNOSIS — N979 Female infertility, unspecified: Secondary | ICD-10-CM

## 2017-09-08 ENCOUNTER — Encounter: Payer: Self-pay | Admitting: Obstetrics & Gynecology

## 2017-09-09 ENCOUNTER — Other Ambulatory Visit: Payer: Self-pay | Admitting: General Practice

## 2017-09-09 ENCOUNTER — Other Ambulatory Visit: Payer: 59

## 2017-09-09 ENCOUNTER — Ambulatory Visit (HOSPITAL_COMMUNITY)
Admission: RE | Admit: 2017-09-09 | Discharge: 2017-09-09 | Disposition: A | Discharge status: Home / Self Care | Payer: 59 | Source: Ambulatory Visit | Attending: Obstetrics & Gynecology | Admitting: Obstetrics & Gynecology

## 2017-09-09 DIAGNOSIS — E229 Hyperfunction of pituitary gland, unspecified: Principal | ICD-10-CM

## 2017-09-09 DIAGNOSIS — N979 Female infertility, unspecified: Secondary | ICD-10-CM | POA: Insufficient documentation

## 2017-09-09 DIAGNOSIS — R7989 Other specified abnormal findings of blood chemistry: Secondary | ICD-10-CM

## 2017-09-09 MED ORDER — IOPAMIDOL (ISOVUE-300) INJECTION 61%
30.0000 mL | Freq: Once | INTRAVENOUS | Status: AC | PRN
  Administered 2017-09-09: 30 mL

## 2017-09-10 LAB — PROLACTIN: Prolactin: 18.2 ng/mL (ref 4.8–23.3)

## 2017-09-12 DIAGNOSIS — N946 Dysmenorrhea, unspecified: Secondary | ICD-10-CM | POA: Diagnosis not present

## 2017-09-12 DIAGNOSIS — Z31430 Encounter of female for testing for genetic disease carrier status for procreative management: Secondary | ICD-10-CM | POA: Diagnosis not present

## 2017-09-12 DIAGNOSIS — N809 Endometriosis, unspecified: Secondary | ICD-10-CM | POA: Diagnosis not present

## 2017-09-12 DIAGNOSIS — N736 Female pelvic peritoneal adhesions (postinfective): Secondary | ICD-10-CM | POA: Diagnosis not present

## 2017-09-12 DIAGNOSIS — Z3161 Procreative counseling and advice using natural family planning: Secondary | ICD-10-CM | POA: Diagnosis not present

## 2017-09-13 ENCOUNTER — Other Ambulatory Visit: Payer: Self-pay | Admitting: Emergency Medicine

## 2017-09-13 DIAGNOSIS — E559 Vitamin D deficiency, unspecified: Secondary | ICD-10-CM

## 2017-09-13 MED ORDER — VITAMIN D (ERGOCALCIFEROL) 1.25 MG (50000 UNIT) PO CAPS: 50000.0000 [IU] | ORAL_CAPSULE | ORAL | 0 refills | Status: DC

## 2017-09-13 MED ORDER — FAMOTIDINE 20 MG PO TABS: 20.0000 mg | ORAL_TABLET | Freq: Two times a day (BID) | ORAL | 3 refills | Status: DC

## 2017-10-21 DIAGNOSIS — Z319 Encounter for procreative management, unspecified: Secondary | ICD-10-CM | POA: Diagnosis not present

## 2017-10-22 DIAGNOSIS — Z3189 Encounter for other procreative management: Secondary | ICD-10-CM | POA: Diagnosis not present

## 2017-11-18 DIAGNOSIS — Z319 Encounter for procreative management, unspecified: Secondary | ICD-10-CM | POA: Diagnosis not present

## 2017-11-21 DIAGNOSIS — Z3189 Encounter for other procreative management: Secondary | ICD-10-CM | POA: Diagnosis not present

## 2017-12-19 DIAGNOSIS — Z3141 Encounter for fertility testing: Secondary | ICD-10-CM | POA: Diagnosis not present

## 2017-12-19 DIAGNOSIS — N83292 Other ovarian cyst, left side: Secondary | ICD-10-CM | POA: Diagnosis not present

## 2017-12-19 DIAGNOSIS — N83291 Other ovarian cyst, right side: Secondary | ICD-10-CM | POA: Diagnosis not present

## 2017-12-19 DIAGNOSIS — N8500 Endometrial hyperplasia, unspecified: Secondary | ICD-10-CM | POA: Diagnosis not present

## 2017-12-19 DIAGNOSIS — Z113 Encounter for screening for infections with a predominantly sexual mode of transmission: Secondary | ICD-10-CM | POA: Diagnosis not present

## 2017-12-19 DIAGNOSIS — Z319 Encounter for procreative management, unspecified: Secondary | ICD-10-CM | POA: Diagnosis not present

## 2017-12-28 DIAGNOSIS — Z3183 Encounter for assisted reproductive fertility procedure cycle: Secondary | ICD-10-CM | POA: Diagnosis not present

## 2018-01-05 DIAGNOSIS — N809 Endometriosis, unspecified: Secondary | ICD-10-CM | POA: Diagnosis not present

## 2018-01-05 DIAGNOSIS — Z3183 Encounter for assisted reproductive fertility procedure cycle: Secondary | ICD-10-CM | POA: Diagnosis not present

## 2018-01-11 DIAGNOSIS — N809 Endometriosis, unspecified: Secondary | ICD-10-CM | POA: Diagnosis not present

## 2018-01-11 DIAGNOSIS — Z3183 Encounter for assisted reproductive fertility procedure cycle: Secondary | ICD-10-CM | POA: Diagnosis not present

## 2018-01-12 DIAGNOSIS — Z3183 Encounter for assisted reproductive fertility procedure cycle: Secondary | ICD-10-CM | POA: Diagnosis not present

## 2018-01-12 DIAGNOSIS — E288 Other ovarian dysfunction: Secondary | ICD-10-CM | POA: Diagnosis not present

## 2018-01-12 DIAGNOSIS — N809 Endometriosis, unspecified: Secondary | ICD-10-CM | POA: Diagnosis not present

## 2018-01-13 DIAGNOSIS — N809 Endometriosis, unspecified: Secondary | ICD-10-CM | POA: Diagnosis not present

## 2018-01-13 DIAGNOSIS — Z3183 Encounter for assisted reproductive fertility procedure cycle: Secondary | ICD-10-CM | POA: Diagnosis not present

## 2018-01-14 DIAGNOSIS — N809 Endometriosis, unspecified: Secondary | ICD-10-CM | POA: Diagnosis not present

## 2018-01-14 DIAGNOSIS — Z3183 Encounter for assisted reproductive fertility procedure cycle: Secondary | ICD-10-CM | POA: Diagnosis not present

## 2018-01-16 DIAGNOSIS — N809 Endometriosis, unspecified: Secondary | ICD-10-CM | POA: Diagnosis not present

## 2018-01-16 DIAGNOSIS — Z3183 Encounter for assisted reproductive fertility procedure cycle: Secondary | ICD-10-CM | POA: Diagnosis not present

## 2018-01-18 DIAGNOSIS — Z3183 Encounter for assisted reproductive fertility procedure cycle: Secondary | ICD-10-CM | POA: Diagnosis not present

## 2018-01-23 DIAGNOSIS — Z3183 Encounter for assisted reproductive fertility procedure cycle: Secondary | ICD-10-CM | POA: Diagnosis not present

## 2018-01-24 DIAGNOSIS — N809 Endometriosis, unspecified: Secondary | ICD-10-CM | POA: Diagnosis not present

## 2018-01-24 DIAGNOSIS — Z3183 Encounter for assisted reproductive fertility procedure cycle: Secondary | ICD-10-CM | POA: Diagnosis not present

## 2018-02-06 DIAGNOSIS — Z760 Encounter for issue of repeat prescription: Secondary | ICD-10-CM | POA: Diagnosis not present

## 2018-02-13 DIAGNOSIS — N809 Endometriosis, unspecified: Secondary | ICD-10-CM | POA: Diagnosis not present

## 2018-02-13 DIAGNOSIS — Z3183 Encounter for assisted reproductive fertility procedure cycle: Secondary | ICD-10-CM | POA: Diagnosis not present

## 2018-02-13 DIAGNOSIS — N981 Hyperstimulation of ovaries: Secondary | ICD-10-CM | POA: Diagnosis not present

## 2018-02-20 DIAGNOSIS — Z3183 Encounter for assisted reproductive fertility procedure cycle: Secondary | ICD-10-CM | POA: Diagnosis not present

## 2018-02-22 DIAGNOSIS — Z3183 Encounter for assisted reproductive fertility procedure cycle: Secondary | ICD-10-CM | POA: Diagnosis not present

## 2018-03-02 DIAGNOSIS — Z3201 Encounter for pregnancy test, result positive: Secondary | ICD-10-CM | POA: Diagnosis not present

## 2018-03-02 DIAGNOSIS — Z3200 Encounter for pregnancy test, result unknown: Secondary | ICD-10-CM | POA: Diagnosis not present

## 2018-03-06 DIAGNOSIS — Z3201 Encounter for pregnancy test, result positive: Secondary | ICD-10-CM | POA: Diagnosis not present

## 2018-03-06 DIAGNOSIS — Z3200 Encounter for pregnancy test, result unknown: Secondary | ICD-10-CM | POA: Diagnosis not present

## 2018-03-22 DIAGNOSIS — Z3200 Encounter for pregnancy test, result unknown: Secondary | ICD-10-CM | POA: Diagnosis not present

## 2018-04-07 DIAGNOSIS — O0900 Supervision of pregnancy with history of infertility, unspecified trimester: Secondary | ICD-10-CM | POA: Diagnosis not present

## 2018-04-12 ENCOUNTER — Other Ambulatory Visit: Payer: Self-pay | Admitting: Obstetrics & Gynecology

## 2018-04-12 DIAGNOSIS — O09819 Supervision of pregnancy resulting from assisted reproductive technology, unspecified trimester: Secondary | ICD-10-CM

## 2018-04-12 DIAGNOSIS — O0990 Supervision of high risk pregnancy, unspecified, unspecified trimester: Secondary | ICD-10-CM | POA: Insufficient documentation

## 2018-04-12 HISTORY — DX: Supervision of pregnancy resulting from assisted reproductive technology, unspecified trimester: O09.819

## 2018-04-28 ENCOUNTER — Encounter (HOSPITAL_COMMUNITY): Payer: Self-pay

## 2018-05-05 ENCOUNTER — Encounter (HOSPITAL_COMMUNITY): Payer: Self-pay

## 2018-05-05 ENCOUNTER — Ambulatory Visit (INDEPENDENT_AMBULATORY_CARE_PROVIDER_SITE_OTHER): Payer: 59 | Admitting: Obstetrics & Gynecology

## 2018-05-05 ENCOUNTER — Encounter: Payer: Self-pay | Admitting: Obstetrics & Gynecology

## 2018-05-05 ENCOUNTER — Ambulatory Visit (HOSPITAL_COMMUNITY)
Admission: RE | Admit: 2018-05-05 | Discharge: 2018-05-05 | Disposition: A | Discharge status: Home / Self Care | Payer: 59 | Source: Ambulatory Visit | Attending: Obstetrics & Gynecology | Admitting: Obstetrics & Gynecology

## 2018-05-05 VITALS — BP 128/82 | HR 92 | Wt 150.6 lb

## 2018-05-05 DIAGNOSIS — Z113 Encounter for screening for infections with a predominantly sexual mode of transmission: Secondary | ICD-10-CM

## 2018-05-05 DIAGNOSIS — O09811 Supervision of pregnancy resulting from assisted reproductive technology, first trimester: Secondary | ICD-10-CM | POA: Diagnosis not present

## 2018-05-05 DIAGNOSIS — O26891 Other specified pregnancy related conditions, first trimester: Secondary | ICD-10-CM

## 2018-05-05 DIAGNOSIS — O281 Abnormal biochemical finding on antenatal screening of mother: Secondary | ICD-10-CM | POA: Diagnosis not present

## 2018-05-05 DIAGNOSIS — O0991 Supervision of high risk pregnancy, unspecified, first trimester: Secondary | ICD-10-CM | POA: Diagnosis not present

## 2018-05-05 DIAGNOSIS — E559 Vitamin D deficiency, unspecified: Secondary | ICD-10-CM

## 2018-05-05 DIAGNOSIS — O10919 Unspecified pre-existing hypertension complicating pregnancy, unspecified trimester: Secondary | ICD-10-CM | POA: Diagnosis not present

## 2018-05-05 DIAGNOSIS — O10013 Pre-existing essential hypertension complicating pregnancy, third trimester: Secondary | ICD-10-CM

## 2018-05-05 DIAGNOSIS — N898 Other specified noninflammatory disorders of vagina: Secondary | ICD-10-CM

## 2018-05-05 DIAGNOSIS — Z86718 Personal history of other venous thrombosis and embolism: Secondary | ICD-10-CM

## 2018-05-05 DIAGNOSIS — Z86711 Personal history of pulmonary embolism: Secondary | ICD-10-CM

## 2018-05-05 DIAGNOSIS — O09812 Supervision of pregnancy resulting from assisted reproductive technology, second trimester: Secondary | ICD-10-CM

## 2018-05-05 DIAGNOSIS — Z3A13 13 weeks gestation of pregnancy: Secondary | ICD-10-CM | POA: Diagnosis not present

## 2018-05-05 DIAGNOSIS — Z3682 Encounter for antenatal screening for nuchal translucency: Secondary | ICD-10-CM | POA: Diagnosis not present

## 2018-05-05 DIAGNOSIS — O0990 Supervision of high risk pregnancy, unspecified, unspecified trimester: Secondary | ICD-10-CM

## 2018-05-05 DIAGNOSIS — Z3689 Encounter for other specified antenatal screening: Secondary | ICD-10-CM

## 2018-05-05 DIAGNOSIS — O09819 Supervision of pregnancy resulting from assisted reproductive technology, unspecified trimester: Secondary | ICD-10-CM

## 2018-05-05 MED ORDER — ASPIRIN EC 81 MG PO TBEC: 81.0000 mg | DELAYED_RELEASE_TABLET | Freq: Every day | ORAL | 2 refills | Status: DC

## 2018-05-05 NOTE — Progress Notes (Signed)
Pt is here for initial OB visit. Pt had IVF to conceive. Pt is having symptoms of yeast infection for a couple weeks and tried Monistat 3 day (9/6-9/10) and no symptom relief. HX of DVT and PE (2017) currently on Lovenox.

## 2018-05-05 NOTE — Patient Instructions (Signed)
First Trimester of Pregnancy The first trimester of pregnancy is from week 1 until the end of week 13 (months 1 through 3). During this time, your baby will begin to develop inside you. At 6-8 weeks, the eyes and face are formed, and the heartbeat can be seen on ultrasound. At the end of 12 weeks, all the baby's organs are formed. Prenatal care is all the medical care you receive before the birth of your baby. Make sure you get good prenatal care and follow all of your doctor's instructions. Follow these instructions at home: Medicines  Take over-the-counter and prescription medicines only as told by your doctor. Some medicines are safe and some medicines are not safe during pregnancy.  Take a prenatal vitamin that contains at least 600 micrograms (mcg) of folic acid.  If you have trouble pooping (constipation), take medicine that will make your stool soft (stool softener) if your doctor approves. Eating and drinking  Eat regular, healthy meals.  Your doctor will tell you the amount of weight gain that is right for you.  Avoid raw meat and uncooked cheese.  If you feel sick to your stomach (nauseous) or throw up (vomit): ? Eat 4 or 5 small meals a day instead of 3 large meals. ? Try eating a few soda crackers. ? Drink liquids between meals instead of during meals.  To prevent constipation: ? Eat foods that are high in fiber, like fresh fruits and vegetables, whole grains, and beans. ? Drink enough fluids to keep your pee (urine) clear or pale yellow. Activity  Exercise only as told by your doctor. Stop exercising if you have cramps or pain in your lower belly (abdomen) or low back.  Do not exercise if it is too hot, too humid, or if you are in a place of great height (high altitude).  Try to avoid standing for long periods of time. Move your legs often if you must stand in one place for a long time.  Avoid heavy lifting.  Wear low-heeled shoes. Sit and stand up straight.  You  can have sex unless your doctor tells you not to. Relieving pain and discomfort  Wear a good support bra if your breasts are sore.  Take warm water baths (sitz baths) to soothe pain or discomfort caused by hemorrhoids. Use hemorrhoid cream if your doctor says it is okay.  Rest with your legs raised if you have leg cramps or low back pain.  If you have puffy, bulging veins (varicose veins) in your legs: ? Wear support hose or compression stockings as told by your doctor. ? Raise (elevate) your feet for 15 minutes, 3-4 times a day. ? Limit salt in your food. Prenatal care  Schedule your prenatal visits by the twelfth week of pregnancy.  Write down your questions. Take them to your prenatal visits.  Keep all your prenatal visits as told by your doctor. This is important. Safety  Wear your seat belt at all times when driving.  Make a list of emergency phone numbers. The list should include numbers for family, friends, the hospital, and police and fire departments. General instructions  Ask your doctor for a referral to a local prenatal class. Begin classes no later than at the start of month 6 of your pregnancy.  Ask for help if you need counseling or if you need help with nutrition. Your doctor can give you advice or tell you where to go for help.  Do not use hot tubs, steam rooms, or   saunas.  Do not douche or use tampons or scented sanitary pads.  Do not cross your legs for long periods of time.  Avoid all herbs and alcohol. Avoid drugs that are not approved by your doctor.  Do not use any tobacco products, including cigarettes, chewing tobacco, and electronic cigarettes. If you need help quitting, ask your doctor. You may get counseling or other support to help you quit.  Avoid cat litter boxes and soil used by cats. These carry germs that can cause birth defects in the baby and can cause a loss of your baby (miscarriage) or stillbirth.  Visit your dentist. At home, brush  your teeth with a soft toothbrush. Be gentle when you floss. Contact a doctor if:  You are dizzy.  You have mild cramps or pressure in your lower belly.  You have a nagging pain in your belly area.  You continue to feel sick to your stomach, you throw up, or you have watery poop (diarrhea).  You have a bad smelling fluid coming from your vagina.  You have pain when you pee (urinate).  You have increased puffiness (swelling) in your face, hands, legs, or ankles. Get help right away if:  You have a fever.  You are leaking fluid from your vagina.  You have spotting or bleeding from your vagina.  You have very bad belly cramping or pain.  You gain or lose weight rapidly.  You throw up blood. It may look like coffee grounds.  You are around people who have German measles, fifth disease, or chickenpox.  You have a very bad headache.  You have shortness of breath.  You have any kind of trauma, such as from a fall or a car accident. Summary  The first trimester of pregnancy is from week 1 until the end of week 13 (months 1 through 3).  To take care of yourself and your unborn baby, you will need to eat healthy meals, take medicines only if your doctor tells you to do so, and do activities that are safe for you and your baby.  Keep all follow-up visits as told by your doctor. This is important as your doctor will have to ensure that your baby is healthy and growing well. This information is not intended to replace advice given to you by your health care provider. Make sure you discuss any questions you have with your health care provider. Document Released: 01/12/2008 Document Revised: 08/03/2016 Document Reviewed: 08/03/2016 Elsevier Interactive Patient Education  2017 Elsevier Inc.  

## 2018-05-05 NOTE — Progress Notes (Signed)
Subjective:   Melissa Ruiz is a 32 y.o. G1P0000 at [redacted]w[redacted]d by date of embryo transfer/IVF dating being seen today for her first obstetrical visit.  Her obstetrical history is significant for being conceived by IVF.  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 Also has CHTN, no medications. Was taking medication for Vitamin D deficiency.  Patient does intend to breast feed. Pregnancy history fully reviewed.   Patient reports no complaints.  HISTORY: OB History  Gravida Para Term Preterm AB Living  1 0 0 0 0 0  SAB TAB Ectopic Multiple Live Births  0 0 0 0 0    # Outcome Date GA Lbr Len/2nd Weight Sex Delivery Anes PTL Lv  1 Current             Last pap smear was done 08/29/2017 and was normal with negative HPV.  Past Medical History:  Diagnosis Date  . DVT (deep venous thrombosis) ([place]) 2017  . Essential hypertension 09/16/2014  . Hyperlipidemia   . Pulmonary embolism ([place]) 09/2015   Past Surgical History:  Procedure Laterality Date  . CHOLECYSTECTOMY     Family History  Problem Relation Age of Onset  . Hypertension Mother   . Hypertension Father   . Diabetes Father   . Cancer Father    Social History   Tobacco Use  . Smoking status: Never Smoker  . Smokeless tobacco: Never Used  Substance Use Topics  . Alcohol use: Yes    Alcohol/week: 0.0 standard drinks    Comment: rarely  . Drug use: No   No Known Allergies Current Outpatient Medications on File Prior to Visit  Medication Sig Dispense Refill  . albuterol (PROVENTIL HFA;VENTOLIN HFA) 108 (90 Base) MCG/ACT inhaler Inhale into the lungs.    . enoxaparin (LOVENOX) 40 MG/0.4ML injection Inject 40 mg into the skin daily.    . famotidine (PEPCID) 20 MG tablet Take 1 tablet (20 mg total) by mouth 2 (two) times daily. 60 tablet 3  . Prenatal MV-Min-Fe Fum-FA-DHA (PRENATAL 1 PO) Take by mouth.     No current facility-administered medications on file  prior to visit.     Review of Systems Pertinent items noted in HPI and remainder of comprehensive ROS otherwise negative.  Exam   Vitals:   05/05/18 1033  BP: 128/82  Pulse: 92  Weight: 150 lb 9.6 oz (68.3 kg)   Fetal Heart Rate (bpm): 165  General: well-developed, well-nourished female in no acute distress  Breast:  deferred  Skin: normal coloration and turgor, no rashes  Neurologic: oriented, normal, negative, normal mood  Extremities: normal strength, tone, and muscle mass, ROM of all joints is normal  HEENT PERRLA, extraocular movement intact and sclera clear, anicteric  Mouth/Teeth mucous membranes moist, pharynx normal without lesions and dental hygiene good  Neck supple and no masses  Cardiovascular: regular rate and rhythm  Respiratory:  no respiratory distress, normal breath sounds  Abdomen: soft, non-tender; bowel sounds normal; no masses,  no organomegaly     Assessment:   Pregnancy: G1P0000 Patient Active Problem List   Diagnosis Date Noted  . Chronic hypertension during pregnancy, antepartum 05/05/2018  . Supervision of high risk pregnancy, antepartum 04/12/2018  . Pregnancy resulting from in vitro fertilization, antepartum 04/12/2018  . History of pulmonary embolus (PE) 08/12/2017  . History of DVT (deep vein thrombosis) 08/12/2017  . Vitamin D deficiency 08/12/2017  . Goiter 08/12/2017  .  Iron deficiency anemia due to chronic blood loss 10/10/2015  . Gastroesophageal reflux disease 12/06/2014  . Hyperlipidemia 12/06/2014  . Essential hypertension 09/16/2014     Plan:  1. Chronic hypertension during pregnancy, antepartum Discussed implications of CHTN in pregnancy, need for optimizing BP control to decrease associated maternal-fetal morbidity and mortality,need for antenatal testing and frequent ultrasounds/prenatal visits. All questions answered. No other complaints or concerns.  Baseline labs ordered, ASA ordered. - Comprehensive metabolic panel -  Hemoglobin A1c - Protein / creatinine ratio, urine - [place] MFM OB DETAIL +14 WK; Future - aspirin EC 81 MG tablet; Take 1 tablet (81 mg total) by mouth daily. Take after 12 weeks for prevention of preeclampsia later in pregnancy  Dispense: 300 tablet; Refill: 2  2. History of pulmonary embolus (PE) 3. History of DVT (deep vein thrombosis) Continue prophylactic Lovenox.  4. Vitamin D deficiency - Vitamin D 25 hydroxy  5. Vaginal discharge during pregnancy in first trimester - Cervicovaginal ancillary only done (self-swab done), will follow up results and manage accordingly.  6. Encounter for fetal anatomic survey Anatomy scan ordered. - [place] MFM OB DETAIL +14 WK; Future  7. Supervision of high risk pregnancy, antepartum - Obstetric Panel, Including HIV - Urine Culture - Enroll Patient in Babyscripts Initial labs drawn. Continue prenatal vitamins. Genetic Screening discussed, was done during IVF process, she will get records.  She also feels she had carrier screening too. Ultrasound discussed; fetal anatomic survey: ordered. Problem list reviewed and updated. The nature of [facility] with multiple MDs and other Advanced Practice Providers was explained to patient; also emphasized that residents, students are part of our team. Routine obstetric precautions reviewed. Return in about 4 weeks (around 06/02/2018) for OB Visit.     [name], MD, [place] for [facility], [place]

## 2018-05-06 LAB — OBSTETRIC PANEL, INCLUDING HIV
Antibody Screen: NEGATIVE
BASOS: 0 %
Basophils Absolute: 0 10*3/uL (ref 0.0–0.2)
EOS (ABSOLUTE): 0.2 10*3/uL (ref 0.0–0.4)
EOS: 3 %
HEMATOCRIT: 36.5 % (ref 34.0–46.6)
HEMOGLOBIN: 12.7 g/dL (ref 11.1–15.9)
HIV SCREEN 4TH GENERATION: NONREACTIVE
Hepatitis B Surface Ag: NEGATIVE
Immature Grans (Abs): 0 10*3/uL (ref 0.0–0.1)
Immature Granulocytes: 0 %
LYMPHS ABS: 1.7 10*3/uL (ref 0.7–3.1)
Lymphs: 26 %
MCH: 32.6 pg (ref 26.6–33.0)
MCHC: 34.8 g/dL (ref 31.5–35.7)
MCV: 94 fL (ref 79–97)
Monocytes Absolute: 0.4 10*3/uL (ref 0.1–0.9)
Monocytes: 6 %
NEUTROS ABS: 4.2 10*3/uL (ref 1.4–7.0)
Neutrophils: 65 %
PLATELETS: 283 10*3/uL (ref 150–450)
RBC: 3.89 x10E6/uL (ref 3.77–5.28)
RDW: 14 % (ref 12.3–15.4)
RH TYPE: POSITIVE
RPR: NONREACTIVE
RUBELLA: 9.95 {index} (ref >0.99)
WBC: 6.4 10*3/uL (ref 3.4–10.8)

## 2018-05-06 LAB — COMPREHENSIVE METABOLIC PANEL
ALBUMIN: 4.3 g/dL (ref 3.5–5.5)
ALT: 25 IU/L (ref 0–32)
AST: 22 IU/L (ref 0–40)
Albumin/Globulin Ratio: 1.7 (ref 1.2–2.2)
Alkaline Phosphatase: 66 IU/L (ref 39–117)
BILIRUBIN TOTAL: 0.4 mg/dL (ref 0.0–1.2)
BUN/Creatinine Ratio: 9 (ref 9–23)
BUN: 8 mg/dL (ref 6–20)
CALCIUM: 9.7 mg/dL (ref 8.7–10.2)
CHLORIDE: 100 mmol/L (ref 96–106)
CO2: 19 mmol/L — ABNORMAL LOW (ref 20–29)
Creatinine, Ser: 0.85 mg/dL (ref 0.57–1.00)
GFR calc Af Amer: 103 mL/min/{1.73_m2} (ref >59)
GFR, EST NON AFRICAN AMERICAN: 90 mL/min/{1.73_m2} (ref >59)
Globulin, Total: 2.5 g/dL (ref 1.5–4.5)
Glucose: 106 mg/dL — ABNORMAL HIGH (ref 65–99)
POTASSIUM: 3.9 mmol/L (ref 3.5–5.2)
Sodium: 137 mmol/L (ref 134–144)
TOTAL PROTEIN: 6.8 g/dL (ref 6.0–8.5)

## 2018-05-06 LAB — PROTEIN / CREATININE RATIO, URINE
CREATININE, UR: 349.3 mg/dL
PROTEIN UR: 40.2 mg/dL
Protein/Creat Ratio: 115 mg/g creat (ref 0–200)

## 2018-05-06 LAB — URINE CULTURE

## 2018-05-06 LAB — HEMOGLOBIN A1C
Est. average glucose Bld gHb Est-mCnc: 100 mg/dL
Hgb A1c MFr Bld: 5.1 % (ref 4.8–5.6)

## 2018-05-06 LAB — VITAMIN D 25 HYDROXY (VIT D DEFICIENCY, FRACTURES): Vit D, 25-Hydroxy: 41.7 ng/mL (ref 30.0–100.0)

## 2018-05-08 ENCOUNTER — Other Ambulatory Visit (HOSPITAL_COMMUNITY): Payer: Self-pay | Admitting: *Deleted

## 2018-05-08 DIAGNOSIS — O10912 Unspecified pre-existing hypertension complicating pregnancy, second trimester: Secondary | ICD-10-CM

## 2018-05-08 LAB — CERVICOVAGINAL ANCILLARY ONLY
BACTERIAL VAGINITIS: NEGATIVE
Candida vaginitis: NEGATIVE
Chlamydia: NEGATIVE
NEISSERIA GONORRHEA: NEGATIVE
TRICH (WINDOWPATH): NEGATIVE

## 2018-05-08 LAB — URINE CYTOLOGY ANCILLARY ONLY
Chlamydia: NEGATIVE
Neisseria Gonorrhea: NEGATIVE
Trichomonas: NEGATIVE

## 2018-05-09 LAB — URINE CYTOLOGY ANCILLARY ONLY
Bacterial vaginitis: NEGATIVE
Candida vaginitis: NEGATIVE

## 2018-05-12 ENCOUNTER — Other Ambulatory Visit: Payer: Self-pay | Admitting: Obstetrics & Gynecology

## 2018-05-12 DIAGNOSIS — Z86711 Personal history of pulmonary embolism: Secondary | ICD-10-CM

## 2018-05-12 DIAGNOSIS — Z86718 Personal history of other venous thrombosis and embolism: Secondary | ICD-10-CM

## 2018-05-12 MED ORDER — ENOXAPARIN SODIUM 40 MG/0.4ML ~~LOC~~ SOLN: 40.0000 mg | SUBCUTANEOUS | 12 refills | Status: DC

## 2018-05-15 ENCOUNTER — Encounter: Payer: Self-pay | Admitting: Radiology

## 2018-05-15 ENCOUNTER — Other Ambulatory Visit: Payer: Self-pay | Admitting: Obstetrics & Gynecology

## 2018-05-15 ENCOUNTER — Telehealth (HOSPITAL_COMMUNITY): Payer: Self-pay | Admitting: *Deleted

## 2018-05-15 DIAGNOSIS — O23592 Infection of other part of genital tract in pregnancy, second trimester: Principal | ICD-10-CM

## 2018-05-15 DIAGNOSIS — N760 Acute vaginitis: Secondary | ICD-10-CM

## 2018-05-15 MED ORDER — TERCONAZOLE 0.8 % VA CREA: 1.0000 | TOPICAL_CREAM | Freq: Every day | VAGINAL | 3 refills | Status: DC

## 2018-05-16 ENCOUNTER — Other Ambulatory Visit: Payer: Self-pay

## 2018-05-22 ENCOUNTER — Telehealth: Payer: Self-pay | Admitting: Obstetrics & Gynecology

## 2018-05-22 MED ORDER — FAMOTIDINE 20 MG PO TABS: 20.0000 mg | ORAL_TABLET | Freq: Two times a day (BID) | ORAL | 3 refills | Status: DC

## 2018-05-22 NOTE — Telephone Encounter (Signed)
Patient called today reporting episode of mild upper and mid abdominal cramping since yesterday evening.  Sporadic in nature, lasts about 10 seconds.  Associated with bloating.  Not alleviated by food or exacerbated by it.  Not alleviated by having a bowel movement, no problems with this. History of cholecystectomy.  Patient was informed that her pain is gastric in etiology; likely upper GI inflammation and advised trial of antacid therapy, Pepcid refilled. If pain worsens, may need further evaluation/imaging (LFTs, lipase, H.pylor testing, RUQ scan etc).  Told to call back with any further concerns.  No obstetric concerns noted.   [name], MD, [place] for [facility], [place]

## 2018-05-31 ENCOUNTER — Ambulatory Visit (HOSPITAL_COMMUNITY)
Admission: RE | Admit: 2018-05-31 | Discharge: 2018-05-31 | Disposition: A | Discharge status: Home / Self Care | Payer: 59 | Source: Ambulatory Visit | Attending: Family Medicine | Admitting: Family Medicine

## 2018-05-31 ENCOUNTER — Encounter (HOSPITAL_COMMUNITY): Payer: Self-pay

## 2018-05-31 ENCOUNTER — Ambulatory Visit (INDEPENDENT_AMBULATORY_CARE_PROVIDER_SITE_OTHER): Payer: 59 | Admitting: Family Medicine

## 2018-05-31 VITALS — BP 121/79 | HR 94 | Wt 153.0 lb

## 2018-05-31 DIAGNOSIS — O09812 Supervision of pregnancy resulting from assisted reproductive technology, second trimester: Secondary | ICD-10-CM | POA: Diagnosis not present

## 2018-05-31 DIAGNOSIS — O209 Hemorrhage in early pregnancy, unspecified: Secondary | ICD-10-CM | POA: Diagnosis not present

## 2018-05-31 DIAGNOSIS — O0992 Supervision of high risk pregnancy, unspecified, second trimester: Secondary | ICD-10-CM

## 2018-05-31 DIAGNOSIS — O10012 Pre-existing essential hypertension complicating pregnancy, second trimester: Secondary | ICD-10-CM

## 2018-05-31 DIAGNOSIS — Z113 Encounter for screening for infections with a predominantly sexual mode of transmission: Secondary | ICD-10-CM | POA: Diagnosis not present

## 2018-05-31 DIAGNOSIS — O2232 Deep phlebothrombosis in pregnancy, second trimester: Secondary | ICD-10-CM

## 2018-05-31 DIAGNOSIS — O4692 Antepartum hemorrhage, unspecified, second trimester: Secondary | ICD-10-CM | POA: Diagnosis not present

## 2018-05-31 DIAGNOSIS — Z3A16 16 weeks gestation of pregnancy: Secondary | ICD-10-CM | POA: Insufficient documentation

## 2018-05-31 DIAGNOSIS — Z86711 Personal history of pulmonary embolism: Secondary | ICD-10-CM

## 2018-05-31 DIAGNOSIS — N898 Other specified noninflammatory disorders of vagina: Secondary | ICD-10-CM

## 2018-05-31 DIAGNOSIS — Z86718 Personal history of other venous thrombosis and embolism: Secondary | ICD-10-CM

## 2018-05-31 DIAGNOSIS — D500 Iron deficiency anemia secondary to blood loss (chronic): Secondary | ICD-10-CM

## 2018-05-31 DIAGNOSIS — O10919 Unspecified pre-existing hypertension complicating pregnancy, unspecified trimester: Secondary | ICD-10-CM

## 2018-05-31 DIAGNOSIS — O10912 Unspecified pre-existing hypertension complicating pregnancy, second trimester: Secondary | ICD-10-CM

## 2018-05-31 DIAGNOSIS — O0990 Supervision of high risk pregnancy, unspecified, unspecified trimester: Secondary | ICD-10-CM

## 2018-05-31 DIAGNOSIS — O09819 Supervision of pregnancy resulting from assisted reproductive technology, unspecified trimester: Secondary | ICD-10-CM

## 2018-05-31 LAB — POCT URINALYSIS DIPSTICK: LEUKOCYTES UA: NEGATIVE

## 2018-05-31 NOTE — Progress Notes (Signed)
   PRENATAL VISIT NOTE  Subjective:  Melissa Ruiz is a 32 y.o. G1P0000 at [redacted]w[redacted]d being seen today for ongoing prenatal care.  She is currently monitored for the following issues for this high-risk pregnancy and has Essential hypertension; Gastroesophageal reflux disease; Hyperlipidemia; Iron deficiency anemia due to chronic blood loss; History of pulmonary embolus (PE); History of DVT (deep vein thrombosis); Vitamin D deficiency; Goiter; Supervision of high risk pregnancy, antepartum; Pregnancy resulting from in vitro fertilization, antepartum; and Chronic hypertension during pregnancy, antepartum on their problem list.  Patient reports backache and bleeding. Last sex > 1wk ago. Contractions: Irritability. Vag. Bleeding: Scant.   . Denies leaking of fluid.   The following portions of the patient's history were reviewed and updated as appropriate: allergies, current medications, past family history, past medical history, past social history, past surgical history and problem list. Problem list updated.  Objective:   Vitals:   05/31/18 1131  BP: 121/79  Pulse: 94  Weight: 153 lb (69.4 kg)    Fetal Status: Fetal Heart Rate (bpm): 161         General:  Alert, oriented and cooperative. Patient is in no acute distress.  Skin: Skin is warm and dry. No rash noted.   Cardiovascular: Normal heart rate noted  Respiratory: Normal respiratory effort, no problems with respiration noted  Abdomen: Soft, gravid, appropriate for gestational age.  Pain/Pressure: Present     Pelvic: Visualized cervix, no protruding membranes. Slightly friable cervix, thick white/clumping         Extremities: Normal range of motion.  Edema: None  Mental Status: Normal mood and affect. Normal behavior. Normal judgment and thought content.   Assessment and Plan:  Pregnancy: G1P0000 at [redacted]w[redacted]d  1. Chronic hypertension during pregnancy, antepartum BP at baseline today  2. Iron deficiency anemia due to chronic  blood loss Last hgb was  Lab Results  Component Value Date   HGB 12.7 05/05/2018   3. History of pulmonary embolus (PE) Taking ASA and enoxaparin  4. History of DVT (deep vein thrombosis) See above  5. Supervision of high risk pregnancy, antepartum UTD   6. Vaginal bleeding, 2nd trimester Reassured by normal fetal heart tones and movement.  Unlikely UTI given no dysuria and Udip negative today. Less likely PTB but concern for this. Could be persistent yeast infection causing irritation. Additionally patient is more at risk for low level trauma causing bleeding given anticoagulation.  - collected GC/CT/Cvag/Bvag/Trich - [place] ordered to check placental location/eval bleeding   Preterm labor symptoms and general obstetric precautions including but not limited to vaginal bleeding, contractions, leaking of fluid and fetal movement were reviewed in detail with the patient. Please refer to After Visit Summary for other counseling recommendations.  No follow-ups on file.  Future Appointments  Date Time Provider [place]  06/28/2018 10:30 AM Caren Macadam, MD [facility]    Caren Macadam, MD

## 2018-05-31 NOTE — Progress Notes (Signed)
For last 2 days had cramping and back pain, today had smear of pinkish, red blood when she wiped today.

## 2018-06-01 ENCOUNTER — Other Ambulatory Visit (HOSPITAL_COMMUNITY): Payer: Self-pay | Admitting: *Deleted

## 2018-06-01 ENCOUNTER — Telehealth: Payer: Self-pay | Admitting: Obstetrics & Gynecology

## 2018-06-01 DIAGNOSIS — Z362 Encounter for other antenatal screening follow-up: Secondary | ICD-10-CM

## 2018-06-01 NOTE — Telephone Encounter (Signed)
Faculty Practice OB/GYN Physician Phone Call Documentation  I received a call from Melissa Ruiz called to discuss continuation of aspirin in the setting of newly diagnosed placenta previa. Had a smear of blood yesterday and had ultrasound that showed posterior previa. She was re-assured that the ASA dose is low, not correlated with previa associated bleeding, and that the benefit of preeclampsia prevention is significant.  She will continue ASA as prescribed.  Bleeding precautions reviewed, also pelvic rest and other previa precautions discussed.       [name], MD, [place] for [facility], [place]

## 2018-06-02 LAB — CERVICOVAGINAL ANCILLARY ONLY
Bacterial vaginitis: NEGATIVE
CANDIDA VAGINITIS: NEGATIVE
Chlamydia: NEGATIVE
Neisseria Gonorrhea: NEGATIVE
Trichomonas: NEGATIVE

## 2018-06-05 ENCOUNTER — Encounter: Payer: Self-pay | Admitting: Family Medicine

## 2018-06-05 DIAGNOSIS — O4400 Placenta previa specified as without hemorrhage, unspecified trimester: Secondary | ICD-10-CM | POA: Insufficient documentation

## 2018-06-06 ENCOUNTER — Encounter (HOSPITAL_COMMUNITY): Payer: Self-pay

## 2018-06-06 ENCOUNTER — Inpatient Hospital Stay (HOSPITAL_COMMUNITY)
Admission: AD | Admit: 2018-06-06 | Discharge: 2018-06-06 | Disposition: A | Discharge status: Home / Self Care | Payer: 59 | Source: Ambulatory Visit | Attending: Obstetrics and Gynecology | Admitting: Obstetrics and Gynecology

## 2018-06-06 ENCOUNTER — Inpatient Hospital Stay (HOSPITAL_BASED_OUTPATIENT_CLINIC_OR_DEPARTMENT_OTHER): Payer: 59

## 2018-06-06 DIAGNOSIS — O26892 Other specified pregnancy related conditions, second trimester: Secondary | ICD-10-CM | POA: Diagnosis not present

## 2018-06-06 DIAGNOSIS — Z7982 Long term (current) use of aspirin: Secondary | ICD-10-CM | POA: Insufficient documentation

## 2018-06-06 DIAGNOSIS — Z3A17 17 weeks gestation of pregnancy: Secondary | ICD-10-CM | POA: Diagnosis not present

## 2018-06-06 DIAGNOSIS — Z7901 Long term (current) use of anticoagulants: Secondary | ICD-10-CM | POA: Insufficient documentation

## 2018-06-06 DIAGNOSIS — O2232 Deep phlebothrombosis in pregnancy, second trimester: Secondary | ICD-10-CM

## 2018-06-06 DIAGNOSIS — D259 Leiomyoma of uterus, unspecified: Secondary | ICD-10-CM | POA: Diagnosis not present

## 2018-06-06 DIAGNOSIS — Z79899 Other long term (current) drug therapy: Secondary | ICD-10-CM | POA: Diagnosis not present

## 2018-06-06 DIAGNOSIS — Z86711 Personal history of pulmonary embolism: Secondary | ICD-10-CM | POA: Diagnosis not present

## 2018-06-06 DIAGNOSIS — O3412 Maternal care for benign tumor of corpus uteri, second trimester: Secondary | ICD-10-CM | POA: Insufficient documentation

## 2018-06-06 DIAGNOSIS — Z8249 Family history of ischemic heart disease and other diseases of the circulatory system: Secondary | ICD-10-CM | POA: Insufficient documentation

## 2018-06-06 DIAGNOSIS — Z86718 Personal history of other venous thrombosis and embolism: Secondary | ICD-10-CM | POA: Insufficient documentation

## 2018-06-06 DIAGNOSIS — Z833 Family history of diabetes mellitus: Secondary | ICD-10-CM | POA: Insufficient documentation

## 2018-06-06 DIAGNOSIS — O10012 Pre-existing essential hypertension complicating pregnancy, second trimester: Secondary | ICD-10-CM

## 2018-06-06 DIAGNOSIS — R109 Unspecified abdominal pain: Secondary | ICD-10-CM | POA: Insufficient documentation

## 2018-06-06 DIAGNOSIS — Z9049 Acquired absence of other specified parts of digestive tract: Secondary | ICD-10-CM | POA: Insufficient documentation

## 2018-06-06 HISTORY — DX: Anemia, unspecified: D64.9

## 2018-06-06 LAB — URINALYSIS, ROUTINE W REFLEX MICROSCOPIC
BILIRUBIN URINE: NEGATIVE
GLUCOSE, UA: NEGATIVE mg/dL
HGB URINE DIPSTICK: NEGATIVE
KETONES UR: NEGATIVE mg/dL
Leukocytes, UA: NEGATIVE
Nitrite: NEGATIVE
PH: 6 (ref 5.0–8.0)
Protein, ur: NEGATIVE mg/dL
SPECIFIC GRAVITY, URINE: 1.02 (ref 1.005–1.030)

## 2018-06-06 MED ORDER — TRAMADOL HCL 50 MG PO TABS: 50.0000 mg | ORAL_TABLET | Freq: Four times a day (QID) | ORAL | 0 refills | Status: DC | PRN

## 2018-06-06 MED ORDER — COMFORT FIT MATERNITY SUPP SM MISC: 1.0000 [IU] | Freq: Every day | 0 refills | Status: DC | PRN

## 2018-06-06 MED ORDER — IBUPROFEN 600 MG PO TABS
600.0000 mg | ORAL_TABLET | Freq: Once | ORAL | Status: AC
  Administered 2018-06-06: 600 mg via ORAL
  Filled 2018-06-06: qty 1

## 2018-06-06 NOTE — MAU Provider Note (Addendum)
Chief Complaint: Abdominal Pain   First Provider Initiated Contact with Patient 06/06/18 0732     SUBJECTIVE HPI: Melissa Ruiz is a 32 y.o. G1P0000 at [redacted]w[redacted]d who presents to Maternity Admissions reporting abdominal pain. Symptoms began a few weeks ago but pain has worsened in the last 2 days. Has known uterine fibroids & placenta previa. Last BM was this morning & was normal for her.  Denies n/v/d, constipation, dysuria, vaginal bleeding, or vaginal discharge.   Location: lower abdomen Quality: pressure Severity: 8/10 on pain scale Duration: 2 weeks Timing: constant Modifying factors: nothing makes pain worse. Has been taking tylenol without relief Associated signs and symptoms: none  Past Medical History:  Diagnosis Date  . Anemia   . DVT (deep venous thrombosis) ([place]) 2017  . Essential hypertension 09/16/2014  . Hyperlipidemia   . Pulmonary embolism ([place]) 09/2015   OB History  Gravida Para Term Preterm AB Living  1 0 0 0 0 0  SAB TAB Ectopic Multiple Live Births  0 0 0 0 0    # Outcome Date GA Lbr Len/2nd Weight Sex Delivery Anes PTL Lv  1 Current            Past Surgical History:  Procedure Laterality Date  . CHOLECYSTECTOMY     Social History   Socioeconomic History  . Marital status: Married    Spouse name: Not on file  . Number of children: Not on file  . Years of education: Not on file  . Highest education level: Not on file  Occupational History  . Not on file  Social Needs  . Financial resource strain: Not on file  . Food insecurity:    Worry: Not on file    Inability: Not on file  . Transportation needs:    Medical: Not on file    Non-medical: Not on file  Tobacco Use  . Smoking status: Never Smoker  . Smokeless tobacco: Never Used  Substance and Sexual Activity  . Alcohol use: Not Currently    Alcohol/week: 0.0 standard drinks    Comment: rarely  . Drug use: No  . Sexual activity: Yes    Birth control/protection: None  Lifestyle   . Physical activity:    Days per week: Not on file    Minutes per session: Not on file  . Stress: Not on file  Relationships  . Social connections:    Talks on phone: Not on file    Gets together: Not on file    Attends religious service: Not on file    Active member of club or organization: Not on file    Attends meetings of clubs or organizations: Not on file    Relationship status: Not on file  . Intimate partner violence:    Fear of current or ex partner: Not on file    Emotionally abused: Not on file    Physically abused: Not on file    Forced sexual activity: Not on file  Other Topics Concern  . Not on file  Social History Narrative  . Not on file   Family History  Problem Relation Age of Onset  . Hypertension Mother   . Hypertension Father   . Diabetes Father   . Cancer Father    No current facility-administered medications on file prior to encounter.    Current Outpatient Medications on File Prior to Encounter  Medication Sig Dispense Refill  . aspirin EC 81 MG tablet Take 1 tablet (81 mg total) by  mouth daily. Take after 12 weeks for prevention of preeclampsia later in pregnancy 300 tablet 2  . enoxaparin (LOVENOX) 40 MG/0.4ML injection Inject 0.4 mLs (40 mg total) into the skin daily. 30 Syringe 12  . famotidine (PEPCID) 20 MG tablet Take 1 tablet (20 mg total) by mouth 2 (two) times daily. 60 tablet 3  . Prenatal MV-Min-Fe Fum-FA-DHA (PRENATAL 1 PO) Take by mouth.    . terconazole (TERAZOL 3) 0.8 % vaginal cream Place 1 applicator vaginally at bedtime. Apply nightly for three nights. 20 g 3  . albuterol (PROVENTIL HFA;VENTOLIN HFA) 108 (90 Base) MCG/ACT inhaler Inhale into the lungs.     No Known Allergies  I have reviewed patient's Past Medical Hx, Surgical Hx, Family Hx, Social Hx, medications and allergies.   Review of Systems  Constitutional: Negative.   Gastrointestinal: Positive for abdominal pain. Negative for constipation, diarrhea, nausea and  vomiting.  Genitourinary: Negative.     OBJECTIVE Patient Vitals for the past 24 hrs:  BP Temp Temp src Pulse Resp SpO2 Height Weight  06/06/18 0657 128/76 98.5 F (36.9 C) Oral 97 18 100 % 5\' 5"  (1.651 m) 69.4 kg   Constitutional: Well-developed, well-nourished female in no acute distress.  Cardiovascular: normal rate & rhythm, no murmur Respiratory: normal rate and effort. Lung sounds clear throughout GI: Abd soft, non-tender, Pos BS x 4. No guarding or rebound tenderness. Fundal height at umbilicus MS: Extremities nontender, no edema, normal ROM Neurologic: Alert and oriented x 4.  GU:     SPECULUM EXAM: NEFG, physiologic discharge, no blood noted, cervix visually closed  Bimanual: deferred d/t previa    LAB RESULTS Results for orders placed or performed during the hospital encounter of 06/06/18 (from the past 24 hour(s))  Urinalysis, Routine w reflex microscopic     Status: Abnormal   Collection Time: 06/06/18  7:50 AM  Result Value Ref Range   Color, Urine YELLOW YELLOW   APPearance HAZY (A) CLEAR   Specific Gravity, Urine 1.020 1.005 - 1.030   pH 6.0 5.0 - 8.0   Glucose, UA NEGATIVE NEGATIVE mg/dL   Hgb urine dipstick NEGATIVE NEGATIVE   Bilirubin Urine NEGATIVE NEGATIVE   Ketones, ur NEGATIVE NEGATIVE mg/dL   Protein, ur NEGATIVE NEGATIVE mg/dL   Nitrite NEGATIVE NEGATIVE   Leukocytes, UA NEGATIVE NEGATIVE    IMAGING No results found.  MAU COURSE Orders Placed This Encounter  Procedures  . [place] MFM OB LIMITED  . US OB Transvaginal  . Urinalysis, Routine w reflex microscopic   Meds ordered this encounter  Medications  . ibuprofen (ADVIL,MOTRIN) tablet 600 mg    MDM FHT present via doppler Gentle spec performed to ensure no advanced dilation prior to going to ultrasound. Ultrasound ordered to assess cervical length.  Ibuprofen 600 mg PO  Care turned over to [place] FNP, 06/06/2018, [phone]  ASSESSMENT/PLAN Uterine fibroids  affecting pregnancy in second trimester - F/U with Dr. Harolyn Rutherford in 1 week // msg sent to admin pool - Information provided on uterine fibroids   Abdominal pain during pregnancy, second trimester  - Rx for Ultram 50 mg every 6 hours prn severe abd pain - Advised to take Ibuprofen 600 mg QID x 48 hrs ONLY - Letter given to be OOW x 48 hrs, returning with restriction of increased breaks of 15 mins every 4 hrs - Information provided on abd pain in pregnancy   - Discharge patient - F/U with [facility] in 1 week -  Patient verbalized an understanding of the plan of care and agrees.     Laury Deep, CNM  06/06/2018 9:52 AM

## 2018-06-06 NOTE — Discharge Instructions (Signed)
You can take Ibuprofen 600 mg every 6 hours for TWO DAYS ONLY as needed for pain.

## 2018-06-06 NOTE — MAU Note (Signed)
Pt having continuing Lower abdominal pain and pressure, rating 8/10. Took Tylenol throughout yesterday with no relief. Had BM. Denies bleeding or leaking. Denies n/v.  Has low lying placenta and fibroids.

## 2018-06-07 ENCOUNTER — Inpatient Hospital Stay (HOSPITAL_COMMUNITY)
Admission: AD | Admit: 2018-06-07 | Discharge: 2018-06-07 | Disposition: A | Discharge status: Home / Self Care | Payer: 59 | Source: Ambulatory Visit | Attending: Family Medicine | Admitting: Family Medicine

## 2018-06-07 ENCOUNTER — Encounter (HOSPITAL_COMMUNITY): Payer: Self-pay | Admitting: *Deleted

## 2018-06-07 DIAGNOSIS — Z79899 Other long term (current) drug therapy: Secondary | ICD-10-CM | POA: Insufficient documentation

## 2018-06-07 DIAGNOSIS — O4402 Placenta previa specified as without hemorrhage, second trimester: Secondary | ICD-10-CM | POA: Diagnosis not present

## 2018-06-07 DIAGNOSIS — Z86718 Personal history of other venous thrombosis and embolism: Secondary | ICD-10-CM | POA: Diagnosis not present

## 2018-06-07 DIAGNOSIS — Z9049 Acquired absence of other specified parts of digestive tract: Secondary | ICD-10-CM | POA: Diagnosis not present

## 2018-06-07 DIAGNOSIS — R109 Unspecified abdominal pain: Secondary | ICD-10-CM | POA: Diagnosis not present

## 2018-06-07 DIAGNOSIS — Z3A17 17 weeks gestation of pregnancy: Secondary | ICD-10-CM | POA: Insufficient documentation

## 2018-06-07 DIAGNOSIS — O26899 Other specified pregnancy related conditions, unspecified trimester: Secondary | ICD-10-CM

## 2018-06-07 DIAGNOSIS — O4692 Antepartum hemorrhage, unspecified, second trimester: Secondary | ICD-10-CM | POA: Diagnosis not present

## 2018-06-07 DIAGNOSIS — N939 Abnormal uterine and vaginal bleeding, unspecified: Secondary | ICD-10-CM | POA: Diagnosis present

## 2018-06-07 DIAGNOSIS — O209 Hemorrhage in early pregnancy, unspecified: Secondary | ICD-10-CM | POA: Diagnosis not present

## 2018-06-07 DIAGNOSIS — Z8249 Family history of ischemic heart disease and other diseases of the circulatory system: Secondary | ICD-10-CM | POA: Diagnosis not present

## 2018-06-07 DIAGNOSIS — O26892 Other specified pregnancy related conditions, second trimester: Secondary | ICD-10-CM | POA: Insufficient documentation

## 2018-06-07 DIAGNOSIS — Z86711 Personal history of pulmonary embolism: Secondary | ICD-10-CM | POA: Diagnosis not present

## 2018-06-07 DIAGNOSIS — Z7901 Long term (current) use of anticoagulants: Secondary | ICD-10-CM | POA: Insufficient documentation

## 2018-06-07 DIAGNOSIS — Z7982 Long term (current) use of aspirin: Secondary | ICD-10-CM | POA: Insufficient documentation

## 2018-06-07 DIAGNOSIS — O4400 Placenta previa specified as without hemorrhage, unspecified trimester: Secondary | ICD-10-CM

## 2018-06-07 LAB — URINALYSIS, ROUTINE W REFLEX MICROSCOPIC
Bilirubin Urine: NEGATIVE
GLUCOSE, UA: NEGATIVE mg/dL
KETONES UR: NEGATIVE mg/dL
LEUKOCYTES UA: NEGATIVE
Nitrite: NEGATIVE
PROTEIN: NEGATIVE mg/dL
Specific Gravity, Urine: 1.003 — ABNORMAL LOW (ref 1.005–1.030)
pH: 6 (ref 5.0–8.0)

## 2018-06-07 MED ORDER — IBUPROFEN 600 MG PO TABS
600.0000 mg | ORAL_TABLET | Freq: Once | ORAL | Status: AC
  Administered 2018-06-07: 600 mg via ORAL
  Filled 2018-06-07: qty 1

## 2018-06-07 NOTE — Discharge Instructions (Signed)
Placenta Previa Placenta previa is a condition in which the placenta implants in the lower part of the uterus in pregnant women. The placenta either partially or completely covers the opening to the cervix. This is a problem because the baby must pass through the cervix during delivery. There are three types of placenta previa:  Marginal placenta previa. The placenta reaches within an inch (2.5 cm) of the cervical opening but does not cover it.  Partial placenta previa. The placenta covers part of the cervical opening.  Complete placenta previa. The placenta covers the entire cervical opening.  If the previa is marginal or partial and it is diagnosed in the first half of pregnancy, the placenta may move into a normal position as the pregnancy progresses and may no longer cover the cervix. It is important to keep all prenatal visits with your health care provider so you can be more closely monitored. What are the causes? The cause of this condition is not known. What increases the risk? This condition is more likely to develop in women who:  Are carrying more than one baby (multiples).  Have an abnormally shaped uterus.  Have scars on the lining of the uterus.  Have had surgeries involving the uterus, such as a cesarean delivery.  Have delivered a baby before.  Have a history of placenta previa.  Have smoked or used cocaine during pregnancy.  Are age 36 or older during pregnancy.  What are the signs or symptoms? The main symptom of this condition is sudden, painless vaginal bleeding during the second half of pregnancy. The amount of bleeding can be very light at first, and it usually stops on its own. Heavier bleeding episodes may also happen. Some women with placenta previa may have no bleeding at all. How is this diagnosed?  This condition is diagnosed: ? From an ultrasound. This test uses sound waves to find where the placenta is located before you have any bleeding  episodes. ? During a checkup after vaginal bleeding is noticed.  If you are diagnosed with a partial or complete previa, digital exams with fingers will generally be avoided. Your health care provider will still perform a speculum exam.  If you did not have an ultrasound during your pregnancy, placenta previa may not be diagnosed until bleeding occurs during labor. How is this treated? Treatment for this condition may include:  Decreased activity.  Bed rest at home or in the hospital.  Pelvic rest. Nothing is placed inside the vagina during pelvic rest. This means not having sex and not using tampons or douches.  A blood transfusion to replace blood that you have lost (maternal blood loss).  A cesarean delivery. This may be performed if: ? The bleeding is heavy and cannot be controlled. ? The placenta completely covers the cervix.  Medicines to stop premature labor or to help the baby's lungs to mature. This treatment may be used if you need delivery before your pregnancy is full-term.  Your treatment will be decided based on:  How much you are bleeding, or whether the bleeding has stopped.  How far along you are in your pregnancy.  The condition of your baby.  The type of placenta previa that you have.  Follow these instructions at home:  Get plenty of rest and lessen activity as told by your health care provider.  Stay on bed rest for as long as told by your health care provider.  Do not have sex, use tampons, use a douche, or place  anything inside of your vagina if your health care provider recommended pelvic rest.  Take over-the-counter and prescription medicines as told by your health care provider.  Keep all follow-up visits as told by your health care provider. This is important. Get help right away if:  You have vaginal bleeding, even if in small amounts and even if you have no pain.  You have cramping or regular contractions.  You have pain in your abdomen  or your lower back.  You have a feeling of increased pressure in your pelvis.  You have increased watery or bloody mucus from the vagina. This information is not intended to replace advice given to you by your health care provider. Make sure you discuss any questions you have with your health care provider. Document Released: 07/26/2005 Document Revised: 04/14/2016 Document Reviewed: 02/07/2016 Elsevier Interactive Patient Education  [employer].

## 2018-06-07 NOTE — MAU Provider Note (Signed)
Chief Complaint:  Vaginal Bleeding   First Provider Initiated Contact with Patient 06/07/18 0322     HPI: Melissa Ruiz is a 32 y.o. G1P0000 at 50w5dwho presents to maternity admissions reporting vaginal bleeding tonight.  Had some light bleeding in past but tonight it was heavier.  Was seen yesterday for cramping and was treated with ibuprofen.  Did not take it at home due to concern about using it with ASA and lovenox.  Has a known posterior previa. She denies LOF, vaginal itching/burning, urinary symptoms, h/a, dizziness, n/v, diarrhea, constipation or fever/chills.  .  Vaginal Bleeding  The patient's primary symptoms include pelvic pain (cramping) and vaginal bleeding. The patient's pertinent negatives include no genital itching, genital lesions or genital odor. This is a new problem. The current episode started today. The problem occurs intermittently. The problem has been gradually improving. She is pregnant. Associated symptoms include abdominal pain. Pertinent negatives include no chills, constipation, diarrhea, fever, nausea or vomiting. The vaginal discharge was bloody. The vaginal bleeding is lighter than menses. She has not been passing clots. She has not been passing tissue. Nothing aggravates the symptoms. She has tried nothing for the symptoms.     RN Note: Pt here with c/o vaginal bleeding and pain. Was seen here yesterday for abdominal pain  Past Medical History: Past Medical History:  Diagnosis Date  . Anemia   . DVT (deep venous thrombosis) ([place]) 2017  . Essential hypertension 09/16/2014  . Hyperlipidemia   . Pulmonary embolism ([place]) 09/2015    Past obstetric history: OB History  Gravida Para Term Preterm AB Living  1 0 0 0 0 0  SAB TAB Ectopic Multiple Live Births  0 0 0 0 0    # Outcome Date GA Lbr Len/2nd Weight Sex Delivery Anes PTL Lv  1 Current             Past Surgical History: Past Surgical History:  Procedure Laterality Date  .  CHOLECYSTECTOMY      Family History: Family History  Problem Relation Age of Onset  . Hypertension Mother   . Hypertension Father   . Diabetes Father   . Cancer Father     Social History: Social History   Tobacco Use  . Smoking status: Never Smoker  . Smokeless tobacco: Never Used  Substance Use Topics  . Alcohol use: Not Currently    Alcohol/week: 0.0 standard drinks    Comment: rarely  . Drug use: No    Allergies: No Known Allergies  Meds:  Medications Prior to Admission  Medication Sig Dispense Refill Last Dose  . acetaminophen (TYLENOL) 500 MG tablet Take 1,000 mg by mouth every 6 (six) hours as needed for mild pain.   06/06/2018 at Unknown time  . aspirin EC 81 MG tablet Take 1 tablet (81 mg total) by mouth daily. Take after 12 weeks for prevention of preeclampsia later in pregnancy 300 tablet 2 06/06/2018 at Unknown time  . Cholecalciferol (VITAMIN D) 2000 units tablet Take 2,000 Units by mouth daily.   06/06/2018 at Unknown time  . Elastic Bandages & Supports (COMFORT FIT MATERNITY SUPP SM) MISC 1 Units by Does not apply route daily as needed. 1 each 0 06/06/2018 at Unknown time  . enoxaparin (LOVENOX) 40 MG/0.4ML injection Inject 0.4 mLs (40 mg total) into the skin daily. 30 Syringe 12 06/06/2018 at Unknown time  . famotidine (PEPCID) 20 MG tablet Take 1 tablet (20 mg total) by mouth 2 (two) times daily. 60 tablet  3 Past Week at Unknown time  . Prenatal Vit-Fe Fumarate-FA (PRENATAL MULTIVITAMIN) TABS tablet Take 1 tablet by mouth daily at 12 noon.   06/06/2018 at Unknown time  . traMADol (ULTRAM) 50 MG tablet Take 1 tablet (50 mg total) by mouth every 6 (six) hours as needed. 20 tablet 0 06/06/2018 at Unknown time    I have reviewed patient's Past Medical Hx, Surgical Hx, Family Hx, Social Hx, medications and allergies.   ROS:  Review of Systems  Constitutional: Negative for chills and fever.  Gastrointestinal: Positive for abdominal pain. Negative for  constipation, diarrhea, nausea and vomiting.  Genitourinary: Positive for pelvic pain (cramping) and vaginal bleeding.   Other systems negative  Physical Exam   Patient Vitals for the past 24 hrs:  Temp src Resp SpO2 Height Weight  06/07/18 0231 Oral 18 100 % 5\' 5"  (1.651 m) 70.3 kg   Constitutional: Well-developed, well-nourished female in no acute distress.  Cardiovascular: normal rate and rhythm Respiratory: normal effort, clear to auscultation bilaterally GI: Abd soft, non-tender, gravid appropriate for gestational age.   No rebound or guarding. MS: Extremities nontender, no edema, normal ROM Neurologic: Alert and oriented x 4.  GU: Neg CVAT.  PELVIC EXAM: Cervix pink, visually closed, without lesion, appears long. Small amount dark red/brown discharge Digital exam deferred due to previa   FHT:  155   Labs: Results for orders placed or performed during the hospital encounter of 06/07/18 (from the past 24 hour(s))  Urinalysis, Routine w reflex microscopic     Status: Abnormal   Collection Time: 06/07/18  3:19 AM  Result Value Ref Range   Color, Urine STRAW (A) YELLOW   APPearance CLEAR CLEAR   Specific Gravity, Urine 1.003 (L) 1.005 - 1.030   pH 6.0 5.0 - 8.0   Glucose, UA NEGATIVE NEGATIVE mg/dL   Hgb urine dipstick LARGE (A) NEGATIVE   Bilirubin Urine NEGATIVE NEGATIVE   Ketones, ur NEGATIVE NEGATIVE mg/dL   Protein, ur NEGATIVE NEGATIVE mg/dL   Nitrite NEGATIVE NEGATIVE   Leukocytes, UA NEGATIVE NEGATIVE   RBC / HPF 6-10 0 - 5 RBC/hpf   WBC, UA 0-5 0 - 5 WBC/hpf   Bacteria, UA RARE (A) NONE SEEN   Squamous Epithelial / LPF 0-5 0 - 5   A/Positive/-- (09/27 1130)  Imaging:  US Ob Transvaginal  Result Date: 06/06/2018 ----------------------------------------------------------------------  OBSTETRICS REPORT                       (Signed Final 06/06/2018 10:16 am) ---------------------------------------------------------------------- Patient Info  ID #:        [id number]                          D.O.B.:  1986/03/10 (32 yrs)  Name:       Melissa Ruiz                  Visit Date: 06/06/2018 09:07 am              [place] ---------------------------------------------------------------------- Performed By  Performed By:     Jeanene Erb BS,      Ref. Address:     [place]  [place], [place]  Attending:        Tama High MD        Location:         [facility]  Referred By:      Osborne Oman MD ---------------------------------------------------------------------- Orders   #  Description                          Code         Ordered By   1  [place] MFM OB LIMITED                    76815.01     ERIN LAWRENCE   2  US OB TRANSVAGINAL                   49702.6      Jorje Guild  ----------------------------------------------------------------------   #  Order #                    Accession #                 Episode #   1  [id number]                  [id number]                  [id number]   2  [id number]                  [id number]                  [id number]  ---------------------------------------------------------------------- Indications   Pelvic pain affecting pregnancy in second      O26.892   trimester   Uterine fibroids affecting pregnancy in        O34.12, D25.9   second trimester, antepartum   Hypertension - Chronic/Pre-existing            O10.019   Pregnancy resulting from assisted              O09.819   reproductive technology   Deep vein thrombosis (DVT) on Lovenox          O22.30   [redacted] weeks gestation of pregnancy                Z3A.17  ---------------------------------------------------------------------- Vital Signs  Weight (lb): 153                               Height:        5'5"  BMI:  25.46  ---------------------------------------------------------------------- Fetal Evaluation  Num Of Fetuses:         1  Fetal Heart Rate(bpm):  158  Cardiac Activity:       Observed  Presentation:           Transverse, head to maternal left  Placenta:               Posterior Previa  Amniotic Fluid  AFI FV:      Within normal limits                              Largest Pocket(cm)                              4.5  Comment:    No placental abruption identified. ---------------------------------------------------------------------- OB History  Gravidity:    1 ---------------------------------------------------------------------- Gestational Age  Best:          17w 4d     Det. By:  D.O. Conception          EDD:   11/10/18                                      (02/17/18) ---------------------------------------------------------------------- Cervix Uterus Adnexa  Cervix  Length:            3.9  cm.  Measured transvaginally. ---------------------------------------------------------------------- Myomas   Site                     L(cm)      W(cm)      D(cm)      Location   Right                    6.7        6.6        5.8   LUS Posterior            1.8        2.1        1.9  ----------------------------------------------------------------------   Blood Flow                 RI        PI       Comments  ---------------------------------------------------------------------- Impression  Patient is being evaluated at the MAU for c/o abdominal pain  ("crampy"). UTI has been ruled out. She does not have  vaginal bleeding.  A limited ultrasound study was performed. Amniotic fluid is  normal and good fetal activity is seen. Myomas are seen  (above). To better-assess the placenta and cervix, we  performed transvaginal ultrasound. The cervix measures 3.9  cm, which is normal. Placenta is seen covering the internal os  (placenta previa).  I explained the findings and reassured her of normal cervical  length measurement. Abdominal pain is  likely from myomas  (possibly degenerating). Treatment is expectant with  analgesics.  Patient was taken back to the MAU. ----------------------------------------------------------------------                  Tama High, MD Electronically Signed Final Report   06/06/2018 10:16 am ----------------------------------------------------------------------  [place] Mfm Ob Comp + 14 Wk  Result Date: 05/31/2018 ----------------------------------------------------------------------  OBSTETRICS REPORT                       (  Signed Final 05/31/2018 05:50 pm) ---------------------------------------------------------------------- Patient Info  ID #:       [id number]                          D.O.B.:  [birth year] (32 yrs)  Name:       Melissa Ruiz                  Visit Date: 05/31/2018 02:41 pm              [place] ---------------------------------------------------------------------- Performed By  Performed By:     Enriqueta Shutter           Ref. Address:     [address] [place], [place]  Attending:        Tama High MD        Location:         [facility]  Referred By:      Osborne Oman MD ---------------------------------------------------------------------- Orders   #  Description                          Code         Ordered By   1  [place] MFM OB COMP + 14 WK               76805.01     [name] NEWTON  ----------------------------------------------------------------------   #  Order #                    Accession #                 Episode #   1  [id number]                  [id number]                  [id number]  ---------------------------------------------------------------------- Indications   Vaginal bleeding in pregnancy, second          O46.92   trimester   Hypertension -  Chronic/Pre-existing            O10.019   Pregnancy resulting from  assisted              O09.819   reproductive technology   Deep vein thrombosis (DVT) on Lovenox          O22.30   [redacted] weeks gestation of pregnancy                Z3A.16  ---------------------------------------------------------------------- Fetal Evaluation  Num Of Fetuses:         1  Fetal Heart Rate(bpm):  157  Cardiac Activity:       Observed  Presentation:           Breech  Placenta:               Posterior Previa  Amniotic Fluid  AFI FV:      Within normal limits                              Largest Pocket(cm)                              4.4 ---------------------------------------------------------------------- Biometry  BPD:      38.4  mm     G. Age:  17w 5d         88  %    CI:        75.82   %    70 - 86                                                          FL/HC:      15.5   %    14.6 - 17.6  HC:      139.8  mm     G. Age:  17w 2d         71  %    HC/AC:      1.17        1.07 - 1.29  AC:      119.1  mm     G. Age:  17w 4d         78  %    FL/BPD:     56.5   %  FL:       21.7  mm     G. Age:  16w 4d         37  %    FL/AC:      18.2   %    20 - 24  HUM:      22.7  mm     G. Age:  17w 0d         62  %  CER:      16.3  mm     G. Age:  16w 2d         41  %  NFT:       3.6  mm  LV:        7.8  mm  CM:        4.2  mm  Est. FW:     182  gm      0 lb 6 oz     68  % ---------------------------------------------------------------------- OB History  Gravidity:    1 ---------------------------------------------------------------------- Gestational Age  U/S Today:     17w 2d                                        EDD:   11/06/18  Best:          16w 5d     Det. By:  D.O. Conception          EDD:   11/10/18                                      (02/17/18) ---------------------------------------------------------------------- Anatomy  Cranium:               Appears normal         LVOT:                   Appears normal  Cavum:                 Appears  normal         Aortic Arch:            Not well visualized  Ventricles:            Appears normal         Ductal Arch:            Appears normal  Choroid Plexus:        Appears normal         Diaphragm:              Appears normal  Cerebellum:            Appears normal         Stomach:                Appears normal, left                                                                        sided  Posterior Fossa:       Appears normal         Abdomen:                Appears normal  Nuchal Fold:           Appears normal         Abdominal Wall:         Appears nml (cord                                                                        insert, abd wall)  Face:                  Not well visualized    Cord Vessels:           Appears normal (3  vessel cord)  Lips:                  Not well visualized    Kidneys:                Appear normal  Palate:                Not well visualized    Bladder:                Appears normal  Thoracic:              Appears normal         Spine:                  Appears normal  Heart:                 Appears normal         Upper Extremities:      Appears normal                         (4CH, axis, and situs  RVOT:                  Not well visualized    Lower Extremities:      Appears normal  Other:  Fetus appears to be a female. Heels visualized. Open hands visualized.          Nasal bone visualized. ---------------------------------------------------------------------- Cervix Uterus Adnexa  Left Ovary  Size(cm)        3   x   1.8    x  1.6       Vol(ml): 4.52  Within normal limits.  Right Ovary  Size(cm)        3   x   1.8    x  1.6       Vol(ml): 4.52  Within normal limits. ---------------------------------------------------------------------- Myomas   Site                     L(cm)      W(cm)      D(cm)      Location   Posterior Superior       7.8        7.5        6.3   Right   Posterior Inferior Right 3           2.5        1.3   Lateral Inferior Left    1.5        1.2        1.4  ----------------------------------------------------------------------   Blood Flow                 RI        PI       Comments  ---------------------------------------------------------------------- Impression  Patient had vaginal bleeding this morning ("smear of blood").  She denies history of sexual intercourse.  On first-trimester screening, the risk for Down syndrome was  not increased.  We performed a fetal anatomy scan. No markers of  aneuploidies or fetal structural defects are seen. Fetal  biometry is consistent with her previously-established dates.  Amniotic fluid is normal and good fetal activity is seen.  Placenta previa is seen (covering the internal os). 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  I explained the finding and also informed her that with  advancing gestation, the placental position can favorably  change (upper segment). I advised abstinence from sexual  intercourse. I briefly discussed timing of delivery (37 weeks) if  placenta previa persists.  Patient reports mild abdominal cramping. It could be from  myomas or from placenta previa leading to uterine  contractions.  Patient understands the limitations of ultrasound in detecting  fetal anomalies. ---------------------------------------------------------------------- Recommendations  An appointment was made for her to return in 4 weeks for  completion of fetal anatomy. ----------------------------------------------------------------------                  Tama High, MD Electronically Signed Final Report   05/31/2018 05:50 pm ----------------------------------------------------------------------  [place] Mfm Ob Limited  Result Date: 06/06/2018 ----------------------------------------------------------------------  OBSTETRICS REPORT                       (Signed Final 06/06/2018 10:16 am)  ---------------------------------------------------------------------- Patient Info  ID #:       [id number]                          D.O.B.:  September 27, 1985 (32 yrs)  Name:       Melissa Ruiz                  Visit Date: 06/06/2018 09:07 am              [place] ---------------------------------------------------------------------- Performed By  Performed By:     Jeanene Erb BS,      Ref. Address:     [place]                    [place], [place]  Attending:        Tama High MD        Location:         [facility]  Referred By:      Osborne Oman MD ---------------------------------------------------------------------- Orders   #  Description                          Code         Ordered By   1  [place] MFM OB LIMITED  30160.10     ERIN LAWRENCE   2  US OB TRANSVAGINAL                   93235.5      Jorje Guild  ----------------------------------------------------------------------   #  Order #                    Accession #                 Episode #   1  [id number]                  [id number]                  [id number]   2  [id number]                  [id number]                  [id number]  ---------------------------------------------------------------------- Indications   Pelvic pain affecting pregnancy in second      O26.892   trimester   Uterine fibroids affecting pregnancy in        O34.12, D25.9   second trimester, antepartum   Hypertension - Chronic/Pre-existing            O10.019   Pregnancy resulting from assisted              O09.819   reproductive technology   Deep vein thrombosis (DVT) on Lovenox          O22.30   [redacted] weeks gestation of pregnancy                Z3A.17  ---------------------------------------------------------------------- Vital Signs  Weight (lb): 153                                Height:        5'5"  BMI:         25.46 ---------------------------------------------------------------------- Fetal Evaluation  Num Of Fetuses:         1  Fetal Heart Rate(bpm):  158  Cardiac Activity:       Observed  Presentation:           Transverse, head to maternal left  Placenta:               Posterior Previa  Amniotic Fluid  AFI FV:      Within normal limits                              Largest Pocket(cm)                              4.5  Comment:    No placental abruption identified. ---------------------------------------------------------------------- OB History  Gravidity:    1 ---------------------------------------------------------------------- Gestational Age  Best:          17w 4d     Det. By:  D.O. Conception          EDD:   11/10/18                                      (02/17/18) ---------------------------------------------------------------------- Cervix Uterus Adnexa  Cervix  Length:  3.9  cm.  Measured transvaginally. ---------------------------------------------------------------------- Myomas   Site                     L(cm)      W(cm)      D(cm)      Location   Right                    6.7        6.6        5.8   LUS Posterior            1.8        2.1        1.9  ----------------------------------------------------------------------   Blood Flow                 RI        PI       Comments  ---------------------------------------------------------------------- Impression  Patient is being evaluated at the MAU for c/o abdominal pain  ("crampy"). UTI has been ruled out. She does not have  vaginal bleeding.  A limited ultrasound study was performed. Amniotic fluid is  normal and good fetal activity is seen. Myomas are seen  (above). To better-assess the placenta and cervix, we  performed transvaginal ultrasound. The cervix measures 3.9  cm, which is normal. Placenta is seen covering the internal os  (placenta previa).  I explained the findings and  reassured her of normal cervical  length measurement. Abdominal pain is likely from myomas  (possibly degenerating). Treatment is expectant with  analgesics.  Patient was taken back to the MAU. ----------------------------------------------------------------------                  Tama High, MD Electronically Signed Final Report   06/06/2018 10:16 am ----------------------------------------------------------------------   MAU Course/MDM: I have ordered labs and reviewed results. [place] normal Prior US reviewed (see above with recommdations) Consult Dr Nehemiah Settle with presentation, exam findings and test results.  He advises expectant management.  Safe to take ibuprofen x 48 hrs as ordered.  Pt asks if can take Naproxen. Discussed we don't usually use it, but that it is same class and short term use is probably OK but we prefer ibuprofen.   Advised to check with office Friday about getting a FHR check prior to weekend due to her high anxiety over fetal loss. Continue pelvic rest.   Assessment: 1. Placenta previa antepartum   2.     Bleeding in the second trimester 3.     Cramping, possibly due to fibroids per MFM  Plan: Discharge home Pelvic rest Has note for out of work Ibuprofen x 48 hrs per MFM Follow up in Office for prenatal visits and recheck of status  Encouraged to return here or to other Urgent Care/ED if she develops worsening of symptoms, increase in pain, fever, or other concerning symptoms.   Pt stable at time of discharge.  Hansel Feinstein CNM, MSN Certified Nurse-Midwife 06/07/2018 3:45 AM

## 2018-06-07 NOTE — MAU Note (Signed)
Pt here with c/o vaginal bleeding and pain. Was seen here yesterday for abdominal pain.

## 2018-06-15 ENCOUNTER — Ambulatory Visit (INDEPENDENT_AMBULATORY_CARE_PROVIDER_SITE_OTHER): Payer: 59 | Admitting: Obstetrics & Gynecology

## 2018-06-15 VITALS — BP 123/77 | HR 83 | Wt 149.0 lb

## 2018-06-15 DIAGNOSIS — O4400 Placenta previa specified as without hemorrhage, unspecified trimester: Secondary | ICD-10-CM

## 2018-06-15 DIAGNOSIS — Z86718 Personal history of other venous thrombosis and embolism: Secondary | ICD-10-CM

## 2018-06-15 DIAGNOSIS — O0990 Supervision of high risk pregnancy, unspecified, unspecified trimester: Secondary | ICD-10-CM | POA: Diagnosis not present

## 2018-06-15 DIAGNOSIS — O10919 Unspecified pre-existing hypertension complicating pregnancy, unspecified trimester: Secondary | ICD-10-CM

## 2018-06-15 DIAGNOSIS — Z86711 Personal history of pulmonary embolism: Secondary | ICD-10-CM

## 2018-06-15 NOTE — Patient Instructions (Signed)
Return to clinic for any scheduled appointments or obstetric concerns, or go to MAU for evaluation  

## 2018-06-15 NOTE — Progress Notes (Signed)
PRENATAL VISIT NOTE  Subjective:  Melissa Ruiz is a 32 y.o. G1P0000 at [redacted]w[redacted]d being seen today for ongoing prenatal care.  She is currently monitored for the following issues for this high-risk pregnancy and has Essential hypertension; Gastroesophageal reflux disease; Hyperlipidemia; Iron deficiency anemia due to chronic blood loss; History of pulmonary embolus (PE); History of DVT (deep vein thrombosis); Vitamin D deficiency; Goiter; Supervision of high risk pregnancy, antepartum; Pregnancy resulting from in vitro fertilization, antepartum; Chronic hypertension during pregnancy, antepartum; and Placenta previa antepartum on their problem list.  Patient reports having spotting with brown bloody discharge.No bright red bleeding.  Contractions: Irritability. Vag. Bleeding: Scant.   . Denies leaking of fluid.   The following portions of the patient's history were reviewed and updated as appropriate: allergies, current medications, past family history, past medical history, past social history, past surgical history and problem list. Problem list updated.  Objective:   Vitals:   06/15/18 1006  BP: 123/77  Pulse: 83  Weight: 149 lb (67.6 kg)    Fetal Status: Fetal Heart Rate (bpm): 155         General:  Alert, oriented and cooperative. Patient is in no acute distress.  Skin: Skin is warm and dry. No rash noted.   Cardiovascular: Normal heart rate noted  Respiratory: Normal respiratory effort, no problems with respiration noted  Abdomen: Soft, gravid, appropriate for gestational age.  Pain/Pressure: Present     Pelvic: Cervical exam deferred        Extremities: Normal range of motion.  Edema: None  Mental Status: Normal mood and affect. Normal behavior. Normal judgment and thought content.    Imaging: US Ob Transvaginal  Result Date: 06/06/2018 ----------------------------------------------------------------------  OBSTETRICS REPORT                       (Signed Final  06/06/2018 10:16 am) ---------------------------------------------------------------------- Patient Info  ID #:       [id number]                          D.O.B.:  02-Jul-1986 (32 yrs)  Name:       Melissa Ruiz                  Visit Date: 06/06/2018 09:07 am              [place] ---------------------------------------------------------------------- Performed By  Performed By:     Jeanene Erb BS,      Ref. Address:     [address]                    [place], [place]  [place]  Attending:        Tama High MD        Location:         [facility]  Referred By:      Osborne Oman MD ---------------------------------------------------------------------- Orders   #  Description                          Code         Ordered By   1  [place] MFM OB LIMITED                    76815.01     ERIN LAWRENCE   2  US OB TRANSVAGINAL                   36644.0      Jorje Guild  ----------------------------------------------------------------------   #  Order #                    Accession #                 Episode #   1  [id number]                  [id number]   2  [id number]                  [id number]                  [id number]  ---------------------------------------------------------------------- Indications   Pelvic pain affecting pregnancy in second      O26.892   trimester   Uterine fibroids affecting pregnancy in        O34.12, D25.9   second trimester, antepartum   Hypertension - Chronic/Pre-existing            O10.019   Pregnancy resulting from assisted              O09.819   reproductive technology   Deep vein thrombosis (DVT) on Lovenox          O22.30   [redacted] weeks gestation of pregnancy                Z3A.17  ---------------------------------------------------------------------- Vital  Signs  Weight (lb): 153                               Height:        5'5"  BMI:         25.46 ---------------------------------------------------------------------- Fetal Evaluation  Num Of Fetuses:         1  Fetal Heart Rate(bpm):  158  Cardiac Activity:       Observed  Presentation:           Transverse, head to maternal left  Placenta:               Posterior Previa  Amniotic Fluid  AFI FV:      Within normal limits                              Largest Pocket(cm)  4.5  Comment:    No placental abruption identified. ---------------------------------------------------------------------- OB History  Gravidity:    1 ---------------------------------------------------------------------- Gestational Age  Best:          17w 4d     Det. By:  D.O. Conception          EDD:   11/10/18                                      (02/17/18) ---------------------------------------------------------------------- Cervix Uterus Adnexa  Cervix  Length:            3.9  cm.  Measured transvaginally. ---------------------------------------------------------------------- Myomas   Site                     L(cm)      W(cm)      D(cm)      Location   Right                    6.7        6.6        5.8   LUS Posterior            1.8        2.1        1.9  ----------------------------------------------------------------------   Blood Flow                 RI        PI       Comments  ---------------------------------------------------------------------- Impression  Patient is being evaluated at the MAU for c/o abdominal pain  ("crampy"). UTI has been ruled out. She does not have  vaginal bleeding.  A limited ultrasound study was performed. Amniotic fluid is  normal and good fetal activity is seen. Myomas are seen  (above). To better-assess the placenta and cervix, we  performed transvaginal ultrasound. The cervix measures 3.9  cm, which is normal. Placenta is seen covering the internal os  (placenta previa).  I  explained the findings and reassured her of normal cervical  length measurement. Abdominal pain is likely from myomas  (possibly degenerating). Treatment is expectant with  analgesics.  Patient was taken back to the MAU. ----------------------------------------------------------------------                  Tama High, MD Electronically Signed Final Report   06/06/2018 10:16 am ----------------------------------------------------------------------  [place] Mfm Ob Comp + 14 Wk  Result Date: 05/31/2018 ----------------------------------------------------------------------  OBSTETRICS REPORT                       (Signed Final 05/31/2018 05:50 pm) ---------------------------------------------------------------------- Patient Info  ID #:       [id number]                          D.O.B.:  1986/05/05 (32 yrs)  Name:       Melissa Ruiz                  Visit Date: 05/31/2018 02:41 pm              [place] ---------------------------------------------------------------------- Performed By  Performed By:     Enriqueta Shutter           Ref. Address:     [address]  [place], [place], [place]  Attending:        Tama High MD        Location:         [facility]  Referred By:      Osborne Oman MD ---------------------------------------------------------------------- Orders   #  Description                          Code         Ordered By   1  [place] MFM OB COMP + 14 WK               76805.01     [name] NEWTON  ----------------------------------------------------------------------   #  Order #                    Accession #                 Episode #   1  [id number]                  [id number]                  [id number]  ----------------------------------------------------------------------  Indications   Vaginal bleeding in pregnancy, second          O46.92   trimester   Hypertension - Chronic/Pre-existing            O10.019   Pregnancy resulting from assisted              O09.819   reproductive technology   Deep vein thrombosis (DVT) on Lovenox          O22.30   [redacted] weeks gestation of pregnancy                Z3A.16  ---------------------------------------------------------------------- Fetal Evaluation  Num Of Fetuses:         1  Fetal Heart Rate(bpm):  157  Cardiac Activity:       Observed  Presentation:           Breech  Placenta:               Posterior Previa  Amniotic Fluid  AFI FV:      Within normal limits  Largest Pocket(cm)                              4.4 ---------------------------------------------------------------------- Biometry  BPD:      38.4  mm     G. Age:  17w 5d         88  %    CI:        75.82   %    70 - 86                                                          FL/HC:      15.5   %    14.6 - 17.6  HC:      139.8  mm     G. Age:  17w 2d         71  %    HC/AC:      1.17        1.07 - 1.29  AC:      119.1  mm     G. Age:  17w 4d         78  %    FL/BPD:     56.5   %  FL:       21.7  mm     G. Age:  16w 4d         37  %    FL/AC:      18.2   %    20 - 24  HUM:      22.7  mm     G. Age:  17w 0d         62  %  CER:      16.3  mm     G. Age:  16w 2d         41  %  NFT:       3.6  mm  LV:        7.8  mm  CM:        4.2  mm  Est. FW:     182  gm      0 lb 6 oz     68  % ---------------------------------------------------------------------- OB History  Gravidity:    1 ---------------------------------------------------------------------- Gestational Age  U/S Today:     17w 2d                                        EDD:   11/06/18  Best:          16w 5d     Det. By:  D.O. Conception          EDD:   11/10/18                                      (02/17/18) ---------------------------------------------------------------------- Anatomy  Cranium:                Appears normal         LVOT:  Appears normal  Cavum:                 Appears normal         Aortic Arch:            Not well visualized  Ventricles:            Appears normal         Ductal Arch:            Appears normal  Choroid Plexus:        Appears normal         Diaphragm:              Appears normal  Cerebellum:            Appears normal         Stomach:                Appears normal, left                                                                        sided  Posterior Fossa:       Appears normal         Abdomen:                Appears normal  Nuchal Fold:           Appears normal         Abdominal Wall:         Appears nml (cord                                                                        insert, abd wall)  Face:                  Not well visualized    Cord Vessels:           Appears normal (3                                                                        vessel cord)  Lips:                  Not well visualized    Kidneys:                Appear normal  Palate:                Not well visualized    Bladder:                Appears normal  Thoracic:              Appears normal  Spine:                  Appears normal  Heart:                 Appears normal         Upper Extremities:      Appears normal                         (4CH, axis, and situs  RVOT:                  Not well visualized    Lower Extremities:      Appears normal  Other:  Fetus appears to be a female. Heels visualized. Open hands visualized.          Nasal bone visualized. ---------------------------------------------------------------------- Cervix Uterus Adnexa  Left Ovary  Size(cm)        3   x   1.8    x  1.6       Vol(ml): 4.52  Within normal limits.  Right Ovary  Size(cm)        3   x   1.8    x  1.6       Vol(ml): 4.52  Within normal limits. ---------------------------------------------------------------------- Myomas   Site                     L(cm)      W(cm)      D(cm)      Location    Posterior Superior       7.8        7.5        6.3   Right   Posterior Inferior Right 3          2.5        1.3   Lateral Inferior Left    1.5        1.2        1.4  ----------------------------------------------------------------------   Blood Flow                 RI        PI       Comments  ---------------------------------------------------------------------- Impression  Patient had vaginal bleeding this morning ("smear of blood").  She denies history of sexual intercourse.  On first-trimester screening, the risk for Down syndrome was  not increased.  We performed a fetal anatomy scan. No markers of  aneuploidies or fetal structural defects are seen. Fetal  biometry is consistent with her previously-established dates.  Amniotic fluid is normal and good fetal activity is seen.  Placenta previa is seen (covering the internal os). 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  I explained the finding and also informed her that with  advancing gestation, the placental position can favorably  change (upper segment). I advised abstinence from sexual  intercourse. I briefly discussed timing of delivery (37 weeks) if  placenta previa persists.  Patient reports mild abdominal cramping. It could be from  myomas or from placenta previa leading to uterine  contractions.  Patient understands the limitations of ultrasound in detecting  fetal anomalies. ---------------------------------------------------------------------- Recommendations  An appointment was made for her to return in 4 weeks for  completion of fetal anatomy. ----------------------------------------------------------------------                  Tama High, MD Electronically Signed Final Report  05/31/2018 05:50 pm ----------------------------------------------------------------------  [place] Mfm Ob Limited  Result Date: 06/06/2018 ----------------------------------------------------------------------  OBSTETRICS REPORT                        (Signed Final 06/06/2018 10:16 am) ---------------------------------------------------------------------- Patient Info  ID #:       [id number]                          D.O.B.:  [birth year] (32 yrs)  Name:       Melissa Ruiz                  Visit Date: 06/06/2018 09:07 am              [place] ---------------------------------------------------------------------- Performed By  Performed By:     Jeanene Erb BS,      Ref. Address:     [address]                                                             [place], [place]  Attending:        Tama High MD        Location:         [facility]  Referred By:      Osborne Oman MD ---------------------------------------------------------------------- Orders   #  Description                          Code         Ordered By   1  [place] MFM OB LIMITED                    76815.01     ERIN LAWRENCE   2  US OB TRANSVAGINAL                   89381.0      Jorje Guild  ----------------------------------------------------------------------   #  Order #                    Accession #                 Episode #   1  [id number]  [id number]                  [id number]   2  [id number]                  [id number]                  [id number]  ---------------------------------------------------------------------- Indications   Pelvic pain affecting pregnancy in second      O26.892   trimester   Uterine fibroids affecting pregnancy in        O34.12, D25.9   second trimester, antepartum   Hypertension - Chronic/Pre-existing            O10.019   Pregnancy resulting from assisted              O09.819   reproductive technology   Deep vein thrombosis (DVT) on Lovenox          O22.30   [redacted] weeks gestation of pregnancy                Z3A.17   ---------------------------------------------------------------------- Vital Signs  Weight (lb): 153                               Height:        5'5"  BMI:         25.46 ---------------------------------------------------------------------- Fetal Evaluation  Num Of Fetuses:         1  Fetal Heart Rate(bpm):  158  Cardiac Activity:       Observed  Presentation:           Transverse, head to maternal left  Placenta:               Posterior Previa  Amniotic Fluid  AFI FV:      Within normal limits                              Largest Pocket(cm)                              4.5  Comment:    No placental abruption identified. ---------------------------------------------------------------------- OB History  Gravidity:    1 ---------------------------------------------------------------------- Gestational Age  Best:          17w 4d     Det. By:  D.O. Conception          EDD:   11/10/18                                      (02/17/18) ---------------------------------------------------------------------- Cervix Uterus Adnexa  Cervix  Length:            3.9  cm.  Measured transvaginally. ---------------------------------------------------------------------- Myomas   Site                     L(cm)      W(cm)      D(cm)      Location   Right                    6.7        6.6        5.8   LUS Posterior  1.8        2.1        1.9  ----------------------------------------------------------------------   Blood Flow                 RI        PI       Comments  ---------------------------------------------------------------------- Impression  Patient is being evaluated at the MAU for c/o abdominal pain  ("crampy"). UTI has been ruled out. She does not have  vaginal bleeding.  A limited ultrasound study was performed. Amniotic fluid is  normal and good fetal activity is seen. Myomas are seen  (above). To better-assess the placenta and cervix, we  performed transvaginal ultrasound. The cervix measures 3.9  cm, which is  normal. Placenta is seen covering the internal os  (placenta previa).  I explained the findings and reassured her of normal cervical  length measurement. Abdominal pain is likely from myomas  (possibly degenerating). Treatment is expectant with  analgesics.  Patient was taken back to the MAU. ----------------------------------------------------------------------                  Tama High, MD Electronically Signed Final Report   06/06/2018 10:16 am ----------------------------------------------------------------------   Assessment and Plan:  Pregnancy: G1P0000 at [redacted]w[redacted]d  1. Chronic hypertension during pregnancy, antepartum Stable BP, continue ASA.  2. History of pulmonary embolus (PE) 3. History of DVT (deep vein thrombosis) Continue Lovenox 40 mg  daily  4. Placenta previa antepartum No bleeding currently, precautions reviewed.  Next scan scheduled.  5. Supervision of high risk pregnancy, antepartum Low risk first trimester screen, AFP today.  - AFP, Serum, Open Spina Bifida No other complaints or concerns.  Routine obstetric precautions reviewed.  Please refer to After Visit Summary for other counseling recommendations.  Return in about 4 weeks (around 07/13/2018) for OB Visit.  Future Appointments  Date Time Provider [place]  06/28/2018 10:30 AM Caren Macadam, MD [facility]  06/28/2018 12:45 PM [place] 2 [facility]    [name], MD

## 2018-06-17 LAB — AFP, SERUM, OPEN SPINA BIFIDA
AFP MOM: 0.92
AFP VALUE AFPOSL: 49.6 ng/mL
Gest. Age on Collection Date: 18.9 weeks
MATERNAL AGE AT EDD: 34.8 a
OSBR RISK 1 IN: 10000
Test Results:: NEGATIVE
Weight: 149 [lb_av]

## 2018-06-20 ENCOUNTER — Ambulatory Visit (HOSPITAL_COMMUNITY): Payer: 59

## 2018-06-20 ENCOUNTER — Encounter (HOSPITAL_COMMUNITY): Payer: Self-pay

## 2018-06-20 ENCOUNTER — Other Ambulatory Visit (HOSPITAL_COMMUNITY): Payer: 59

## 2018-06-28 ENCOUNTER — Ambulatory Visit (HOSPITAL_COMMUNITY)
Admission: RE | Admit: 2018-06-28 | Discharge: 2018-06-28 | Disposition: A | Discharge status: Home / Self Care | Payer: 59 | Source: Ambulatory Visit | Attending: Family Medicine | Admitting: Family Medicine

## 2018-06-28 ENCOUNTER — Encounter (HOSPITAL_COMMUNITY): Payer: Self-pay

## 2018-06-28 ENCOUNTER — Encounter: Payer: 59 | Admitting: Family Medicine

## 2018-06-28 DIAGNOSIS — O3412 Maternal care for benign tumor of corpus uteri, second trimester: Secondary | ICD-10-CM

## 2018-06-28 DIAGNOSIS — O09812 Supervision of pregnancy resulting from assisted reproductive technology, second trimester: Secondary | ICD-10-CM

## 2018-06-28 DIAGNOSIS — O4400 Placenta previa specified as without hemorrhage, unspecified trimester: Secondary | ICD-10-CM

## 2018-06-28 DIAGNOSIS — O10012 Pre-existing essential hypertension complicating pregnancy, second trimester: Secondary | ICD-10-CM | POA: Diagnosis not present

## 2018-06-28 DIAGNOSIS — O2232 Deep phlebothrombosis in pregnancy, second trimester: Secondary | ICD-10-CM

## 2018-06-28 DIAGNOSIS — Z3A20 20 weeks gestation of pregnancy: Secondary | ICD-10-CM | POA: Diagnosis not present

## 2018-06-28 DIAGNOSIS — Z362 Encounter for other antenatal screening follow-up: Secondary | ICD-10-CM

## 2018-06-29 ENCOUNTER — Other Ambulatory Visit (HOSPITAL_COMMUNITY): Payer: Self-pay | Admitting: *Deleted

## 2018-06-29 DIAGNOSIS — Z362 Encounter for other antenatal screening follow-up: Secondary | ICD-10-CM

## 2018-07-04 ENCOUNTER — Telehealth: Payer: Self-pay | Admitting: Obstetrics & Gynecology

## 2018-07-04 NOTE — Telephone Encounter (Signed)
    Called patient after she requested callback to discuss results of recent ultrasound.  06/28/2018 [place] MFM Comments   We observed intermittent and irregular premature atrial  contractions (PAC's). PAC's are the most common fetal  arrhythmia in pregnancy, manifest as an irregularly irregular  variability in fetal heart rate, usually beginning between 18  and 24 weeks' gestation, but occasionally appearing initially  during the third trimester. Rarely, PACs are associated with  intermittent SVT. PACs may be exacerbated by ingestion of  caffeine, decongestant medications (stimulants), or tobacco.  Typically, PACs resolve spontaneously within 2 to 3 weeks  after diagnosis. PACs do not represent any real risk to the  fetus, and they do not require treatment. However, a small  percentage of fetuses with isolated PACs develop  supraventricular tachycardia (SVT).  We recommend weekly monitoring with fetal auscultation until resolution. Impression  Normal interval growth.  Premature atrial contractions  IVF  Posterior placenta previa Recommendations  Follow up in 4 weeks growth  Assessment of mode of delivery at 34-35 weeks. Sander Nephew, MD Electronically Signed Final Report   06/28/2018   Patient did not answer but left a message for her to call to make an appointment to come in for FHR auscultation as recommended. Reassured her that this is usually a limited condition, and will likely resolve soon. If patient is really concerned, she can be ordered for a fetal ECHO to ensure there are no fetal cardiac anomalies (no anomalies seen on ultrasounds done); ECHO can also look at [facility] and other parameters if needed.  The aforementioned ultrasound result was released for patient for review on MyChart.    [name], MD, [place] for [employer], [place]

## 2018-07-05 ENCOUNTER — Ambulatory Visit (INDEPENDENT_AMBULATORY_CARE_PROVIDER_SITE_OTHER): Payer: 59 | Admitting: *Deleted

## 2018-07-05 VITALS — BP 123/81 | HR 98

## 2018-07-05 DIAGNOSIS — O0990 Supervision of high risk pregnancy, unspecified, unspecified trimester: Secondary | ICD-10-CM

## 2018-07-05 NOTE — Progress Notes (Signed)
I have reviewed the chart and agree with nursing staff's documentation of this patient's encounter.  [name], MD 07/05/2018 2:00 PM

## 2018-07-05 NOTE — Progress Notes (Signed)
Pt here today for her 2 minute doppler. FHR 150's. Denies any complaints at this time. Baby moving well, no vaginal bleeding. Has appointment next week.   Crosby Oyster, RN

## 2018-07-12 ENCOUNTER — Ambulatory Visit (INDEPENDENT_AMBULATORY_CARE_PROVIDER_SITE_OTHER): Payer: 59 | Admitting: Family Medicine

## 2018-07-12 ENCOUNTER — Encounter: Payer: Self-pay | Admitting: Family Medicine

## 2018-07-12 VITALS — BP 121/77 | HR 72 | Wt 153.2 lb

## 2018-07-12 DIAGNOSIS — Z3A22 22 weeks gestation of pregnancy: Secondary | ICD-10-CM

## 2018-07-12 DIAGNOSIS — O10912 Unspecified pre-existing hypertension complicating pregnancy, second trimester: Secondary | ICD-10-CM

## 2018-07-12 DIAGNOSIS — O4400 Placenta previa specified as without hemorrhage, unspecified trimester: Secondary | ICD-10-CM

## 2018-07-12 DIAGNOSIS — O4402 Placenta previa specified as without hemorrhage, second trimester: Secondary | ICD-10-CM

## 2018-07-12 DIAGNOSIS — O0990 Supervision of high risk pregnancy, unspecified, unspecified trimester: Secondary | ICD-10-CM

## 2018-07-12 DIAGNOSIS — O09819 Supervision of pregnancy resulting from assisted reproductive technology, unspecified trimester: Secondary | ICD-10-CM

## 2018-07-12 DIAGNOSIS — Z86711 Personal history of pulmonary embolism: Secondary | ICD-10-CM

## 2018-07-12 DIAGNOSIS — O10919 Unspecified pre-existing hypertension complicating pregnancy, unspecified trimester: Secondary | ICD-10-CM

## 2018-07-12 DIAGNOSIS — O0992 Supervision of high risk pregnancy, unspecified, second trimester: Secondary | ICD-10-CM

## 2018-07-12 DIAGNOSIS — O09812 Supervision of pregnancy resulting from assisted reproductive technology, second trimester: Secondary | ICD-10-CM

## 2018-07-12 NOTE — Progress Notes (Signed)
   PRENATAL VISIT NOTE  Subjective:  Melissa Ruiz is a 32 y.o. G1P0000 at [redacted]w[redacted]d being seen today for ongoing prenatal care.  She is currently monitored for the following issues for this high-risk pregnancy and has Essential hypertension; Gastroesophageal reflux disease; Hyperlipidemia; Iron deficiency anemia due to chronic blood loss; History of pulmonary embolus (PE); History of DVT (deep vein thrombosis); Vitamin D deficiency; Goiter; Supervision of high risk pregnancy, antepartum; Pregnancy resulting from in vitro fertilization, antepartum; Chronic hypertension during pregnancy, antepartum; and Placenta previa antepartum on their problem list.  Patient reports occasional cramping, located in lower abdomen and will be there for a day but then go away. Reports taking off work for 1 day due to cramping. .  Contractions: Irritability.  .  Movement: Present. Denies leaking of fluid.   The following portions of the patient's history were reviewed and updated as appropriate: allergies, current medications, past family history, past medical history, past social history, past surgical history and problem list. Problem list updated.  Objective:   Vitals:   07/12/18 1113  BP: 121/77  Pulse: 72  Weight: 153 lb 3.2 oz (69.5 kg)    Fetal Status: Fetal Heart Rate (bpm): 153   Movement: Present     General:  Alert, oriented and cooperative. Patient is in no acute distress.  Skin: Skin is warm and dry. No rash noted.   Cardiovascular: Normal heart rate noted  Respiratory: Normal respiratory effort, no problems with respiration noted  Abdomen: Soft, gravid, appropriate for gestational age.  Pain/Pressure: Present     Pelvic: Cervical exam deferred        Extremities: Normal range of motion.  Edema: None  Mental Status: Normal mood and affect. Normal behavior. Normal judgment and thought content.   Assessment and Plan:  Pregnancy: G1P0000 at [redacted]w[redacted]d  1. Chronic hypertension during  pregnancy, antepartum BP wnl today Continue ASA   2. History of pulmonary embolus (PE) On lovenox, continue injections  3. Supervision of high risk pregnancy, antepartum Up to date with standard pregnancy screenings  4. Pregnancy resulting from in vitro fertilization, antepartum  5. Placenta previa antepartum Posterior previa, reassessment scheduled  Delivery planning at 34-35 weeks Previa precautions previously discussed with MFM  6. Fetal arrhythmia - has follow up US schedule to assess for SVT vs PACs. (see [place] from 11/20)  Preterm labor symptoms and general obstetric precautions including but not limited to vaginal bleeding, contractions, leaking of fluid and fetal movement were reviewed in detail with the patient. Please refer to After Visit Summary for other counseling recommendations.  Return in about 4 weeks (around 08/09/2018) for Routine prenatal care.  Future Appointments  Date Time Provider [place]  07/28/2018  3:30 PM [place] 5 [facility]  08/15/2018  8:15 AM Aletha Halim, MD [facility]    Caren Macadam, MD

## 2018-07-17 ENCOUNTER — Telehealth: Payer: Self-pay | Admitting: *Deleted

## 2018-07-17 ENCOUNTER — Other Ambulatory Visit: Payer: Self-pay | Admitting: Obstetrics & Gynecology

## 2018-07-17 DIAGNOSIS — O368390 Maternal care for abnormalities of the fetal heart rate or rhythm, unspecified trimester, not applicable or unspecified: Secondary | ICD-10-CM

## 2018-07-17 NOTE — Progress Notes (Signed)
Patient called today reported increased irregularity in her fetal heart rate over the weekend; she uses a home doppler device. She is very concerned that the PACs are still persistent and possibly getting worse.  She was told that a fetal ECHO would be obtained for further evaluation, but was assured that many PACs can occur due to idiopathic causes and are usually still self-limited.  Will obtain fetal ECHO to evaluate for any fetal cardiac anomalies. Patient was also given the choice to come in for ausculation in the office as needed.    [name], MD, [place] for [employer], [place]

## 2018-07-17 NOTE — Telephone Encounter (Signed)
Called patient to inform her of appointment at [facility] in [place] for fetal echo this Thursday at [place].

## 2018-07-20 ENCOUNTER — Encounter: Payer: Self-pay | Admitting: Radiology

## 2018-07-20 DIAGNOSIS — O368390 Maternal care for abnormalities of the fetal heart rate or rhythm, unspecified trimester, not applicable or unspecified: Secondary | ICD-10-CM | POA: Diagnosis not present

## 2018-07-28 ENCOUNTER — Encounter (HOSPITAL_COMMUNITY): Payer: Self-pay

## 2018-07-28 ENCOUNTER — Ambulatory Visit (HOSPITAL_COMMUNITY)
Admission: RE | Admit: 2018-07-28 | Discharge: 2018-07-28 | Disposition: A | Discharge status: Home / Self Care | Payer: 59 | Source: Ambulatory Visit | Attending: Family Medicine | Admitting: Family Medicine

## 2018-07-28 DIAGNOSIS — O10012 Pre-existing essential hypertension complicating pregnancy, second trimester: Secondary | ICD-10-CM

## 2018-07-28 DIAGNOSIS — O2232 Deep phlebothrombosis in pregnancy, second trimester: Secondary | ICD-10-CM | POA: Diagnosis not present

## 2018-07-28 DIAGNOSIS — D219 Benign neoplasm of connective and other soft tissue, unspecified: Secondary | ICD-10-CM | POA: Insufficient documentation

## 2018-07-28 DIAGNOSIS — O09812 Supervision of pregnancy resulting from assisted reproductive technology, second trimester: Secondary | ICD-10-CM | POA: Diagnosis not present

## 2018-07-28 DIAGNOSIS — Z3A26 26 weeks gestation of pregnancy: Secondary | ICD-10-CM | POA: Diagnosis not present

## 2018-07-28 DIAGNOSIS — Z3A25 25 weeks gestation of pregnancy: Secondary | ICD-10-CM

## 2018-07-28 DIAGNOSIS — Z362 Encounter for other antenatal screening follow-up: Secondary | ICD-10-CM | POA: Insufficient documentation

## 2018-07-28 DIAGNOSIS — I491 Atrial premature depolarization: Secondary | ICD-10-CM | POA: Insufficient documentation

## 2018-07-28 DIAGNOSIS — O4400 Placenta previa specified as without hemorrhage, unspecified trimester: Secondary | ICD-10-CM

## 2018-07-28 DIAGNOSIS — O4402 Placenta previa specified as without hemorrhage, second trimester: Secondary | ICD-10-CM | POA: Insufficient documentation

## 2018-07-28 DIAGNOSIS — O3412 Maternal care for benign tumor of corpus uteri, second trimester: Secondary | ICD-10-CM | POA: Diagnosis not present

## 2018-07-28 DIAGNOSIS — O368320 Maternal care for abnormalities of the fetal heart rate or rhythm, second trimester, not applicable or unspecified: Secondary | ICD-10-CM

## 2018-07-28 DIAGNOSIS — O368390 Maternal care for abnormalities of the fetal heart rate or rhythm, unspecified trimester, not applicable or unspecified: Secondary | ICD-10-CM

## 2018-07-31 ENCOUNTER — Other Ambulatory Visit (HOSPITAL_COMMUNITY): Payer: Self-pay | Admitting: *Deleted

## 2018-07-31 DIAGNOSIS — Z362 Encounter for other antenatal screening follow-up: Secondary | ICD-10-CM

## 2018-08-02 ENCOUNTER — Inpatient Hospital Stay (HOSPITAL_COMMUNITY)
Admission: AD | Admit: 2018-08-02 | Discharge: 2018-08-08 | DRG: 832 | Disposition: A | Discharge status: Home / Self Care | Payer: 59 | Attending: Obstetrics & Gynecology | Admitting: Obstetrics & Gynecology

## 2018-08-02 ENCOUNTER — Inpatient Hospital Stay (HOSPITAL_BASED_OUTPATIENT_CLINIC_OR_DEPARTMENT_OTHER): Payer: 59

## 2018-08-02 ENCOUNTER — Other Ambulatory Visit: Payer: Self-pay

## 2018-08-02 ENCOUNTER — Encounter (HOSPITAL_COMMUNITY): Payer: Self-pay | Admitting: *Deleted

## 2018-08-02 DIAGNOSIS — D259 Leiomyoma of uterus, unspecified: Secondary | ICD-10-CM | POA: Diagnosis present

## 2018-08-02 DIAGNOSIS — O368390 Maternal care for abnormalities of the fetal heart rate or rhythm, unspecified trimester, not applicable or unspecified: Secondary | ICD-10-CM | POA: Diagnosis present

## 2018-08-02 DIAGNOSIS — O4432 Partial placenta previa with hemorrhage, second trimester: Secondary | ICD-10-CM | POA: Diagnosis not present

## 2018-08-02 DIAGNOSIS — O368320 Maternal care for abnormalities of the fetal heart rate or rhythm, second trimester, not applicable or unspecified: Secondary | ICD-10-CM | POA: Diagnosis present

## 2018-08-02 DIAGNOSIS — Z86718 Personal history of other venous thrombosis and embolism: Secondary | ICD-10-CM | POA: Diagnosis not present

## 2018-08-02 DIAGNOSIS — O4692 Antepartum hemorrhage, unspecified, second trimester: Secondary | ICD-10-CM

## 2018-08-02 DIAGNOSIS — O09812 Supervision of pregnancy resulting from assisted reproductive technology, second trimester: Secondary | ICD-10-CM | POA: Diagnosis not present

## 2018-08-02 DIAGNOSIS — Z86711 Personal history of pulmonary embolism: Secondary | ICD-10-CM

## 2018-08-02 DIAGNOSIS — O26873 Cervical shortening, third trimester: Secondary | ICD-10-CM | POA: Diagnosis present

## 2018-08-02 DIAGNOSIS — O3412 Maternal care for benign tumor of corpus uteri, second trimester: Secondary | ICD-10-CM | POA: Diagnosis not present

## 2018-08-02 DIAGNOSIS — O0990 Supervision of high risk pregnancy, unspecified, unspecified trimester: Secondary | ICD-10-CM

## 2018-08-02 DIAGNOSIS — O10919 Unspecified pre-existing hypertension complicating pregnancy, unspecified trimester: Secondary | ICD-10-CM | POA: Diagnosis present

## 2018-08-02 DIAGNOSIS — Z3A25 25 weeks gestation of pregnancy: Secondary | ICD-10-CM | POA: Diagnosis not present

## 2018-08-02 DIAGNOSIS — O4412 Placenta previa with hemorrhage, second trimester: Secondary | ICD-10-CM | POA: Diagnosis not present

## 2018-08-02 DIAGNOSIS — I10 Essential (primary) hypertension: Secondary | ICD-10-CM | POA: Diagnosis present

## 2018-08-02 DIAGNOSIS — O10012 Pre-existing essential hypertension complicating pregnancy, second trimester: Secondary | ICD-10-CM | POA: Diagnosis not present

## 2018-08-02 DIAGNOSIS — O2232 Deep phlebothrombosis in pregnancy, second trimester: Secondary | ICD-10-CM

## 2018-08-02 DIAGNOSIS — O26872 Cervical shortening, second trimester: Secondary | ICD-10-CM

## 2018-08-02 DIAGNOSIS — Z3A26 26 weeks gestation of pregnancy: Secondary | ICD-10-CM | POA: Diagnosis not present

## 2018-08-02 DIAGNOSIS — O4402 Placenta previa specified as without hemorrhage, second trimester: Principal | ICD-10-CM | POA: Diagnosis present

## 2018-08-02 DIAGNOSIS — O4400 Placenta previa specified as without hemorrhage, unspecified trimester: Secondary | ICD-10-CM

## 2018-08-02 LAB — ABO/RH: ABO/RH(D): A POS

## 2018-08-02 LAB — URINALYSIS, ROUTINE W REFLEX MICROSCOPIC
BILIRUBIN URINE: NEGATIVE
GLUCOSE, UA: NEGATIVE mg/dL
Ketones, ur: 5 mg/dL — AB
LEUKOCYTES UA: NEGATIVE
NITRITE: NEGATIVE
Protein, ur: NEGATIVE mg/dL
SPECIFIC GRAVITY, URINE: 1.009 (ref 1.005–1.030)
pH: 6 (ref 5.0–8.0)

## 2018-08-02 LAB — CBC
HEMATOCRIT: 32.8 % — AB (ref 36.0–46.0)
HEMOGLOBIN: 11.2 g/dL — AB (ref 12.0–15.0)
MCH: 32.7 pg (ref 26.0–34.0)
MCHC: 34.1 g/dL (ref 30.0–36.0)
MCV: 95.9 fL (ref 80.0–100.0)
PLATELETS: 292 10*3/uL (ref 150–400)
RBC: 3.42 MIL/uL — ABNORMAL LOW (ref 3.87–5.11)
RDW: 13.7 % (ref 11.5–15.5)
WBC: 12.1 10*3/uL — ABNORMAL HIGH (ref 4.0–10.5)
nRBC: 0 % (ref 0.0–0.2)

## 2018-08-02 LAB — TYPE AND SCREEN
ABO/RH(D): A POS
Antibody Screen: NEGATIVE

## 2018-08-02 MED ORDER — BETAMETHASONE SOD PHOS & ACET 6 (3-3) MG/ML IJ SUSP
12.0000 mg | Freq: Once | INTRAMUSCULAR | Status: AC
  Administered 2018-08-03: 12 mg via INTRAMUSCULAR
  Filled 2018-08-02: qty 2

## 2018-08-02 MED ORDER — BETAMETHASONE SOD PHOS & ACET 6 (3-3) MG/ML IJ SUSP
12.0000 mg | Freq: Once | INTRAMUSCULAR | Status: AC
  Administered 2018-08-02: 12 mg via INTRAMUSCULAR
  Filled 2018-08-02: qty 2

## 2018-08-02 MED ORDER — PRENATAL MULTIVITAMIN CH
1.0000 | ORAL_TABLET | Freq: Every day | ORAL | Status: DC
  Administered 2018-08-03 – 2018-08-08 (×6): 1 via ORAL
  Filled 2018-08-02 (×6): qty 1

## 2018-08-02 MED ORDER — ZOLPIDEM TARTRATE 5 MG PO TABS: 5.0000 mg | ORAL_TABLET | Freq: Every evening | ORAL | Status: DC | PRN

## 2018-08-02 MED ORDER — CALCIUM CARBONATE ANTACID 500 MG PO CHEW: 2.0000 | CHEWABLE_TABLET | ORAL | Status: DC | PRN

## 2018-08-02 MED ORDER — ENOXAPARIN SODIUM 40 MG/0.4ML ~~LOC~~ SOLN
40.0000 mg | SUBCUTANEOUS | Status: DC
  Administered 2018-08-03 – 2018-08-07 (×5): 40 mg via SUBCUTANEOUS
  Filled 2018-08-02 (×5): qty 0.4

## 2018-08-02 MED ORDER — LACTATED RINGERS IV SOLN
INTRAVENOUS | Status: DC
  Administered 2018-08-02 – 2018-08-03 (×3): via INTRAVENOUS

## 2018-08-02 MED ORDER — LACTATED RINGERS IV BOLUS: 1000.0000 mL | Freq: Once | INTRAVENOUS | Status: DC

## 2018-08-02 MED ORDER — MAGNESIUM SULFATE BOLUS VIA INFUSION
4.0000 g | Freq: Once | INTRAVENOUS | Status: AC
  Administered 2018-08-02: 4 g via INTRAVENOUS
  Filled 2018-08-02: qty 500

## 2018-08-02 MED ORDER — MAGNESIUM SULFATE 40 G IN LACTATED RINGERS - SIMPLE
2.0000 g/h | INTRAVENOUS | Status: DC
  Administered 2018-08-02 – 2018-08-03 (×2): 2 g/h via INTRAVENOUS
  Filled 2018-08-02 (×2): qty 500

## 2018-08-02 MED ORDER — DOCUSATE SODIUM 100 MG PO CAPS
100.0000 mg | ORAL_CAPSULE | Freq: Every day | ORAL | Status: DC
  Administered 2018-08-03 – 2018-08-08 (×6): 100 mg via ORAL
  Filled 2018-08-02 (×6): qty 1

## 2018-08-02 MED ORDER — ACETAMINOPHEN 325 MG PO TABS
650.0000 mg | ORAL_TABLET | ORAL | Status: DC | PRN
  Administered 2018-08-02: 650 mg via ORAL
  Filled 2018-08-02: qty 2

## 2018-08-02 NOTE — MAU Note (Signed)
Been having cramping since Monday evening.  Has been getting worse.  Talked to OB, took Ibuprofen, some help. Started bleeding at 4- noted in toilet.  Hx of placenta previa w/fibroids

## 2018-08-02 NOTE — MAU Provider Note (Signed)
See H&P  Tamala Julian [place], [place] 08/02/2018 7:19 PM

## 2018-08-02 NOTE — Progress Notes (Signed)
EFM removed for bedside ultrasound.

## 2018-08-02 NOTE — H&P (Signed)
Melissa Ruiz is a 32 y.o. female presenting for worsening cramping since 07/31/18 and bright red vaginal bleeding since 1600 today. Known posterior previa. Had an episode of bleeding at 17 weeks.  Pregnancy significant for: - Hx PE, Is on Lovenox 40 mg QD - Hx CHTN, no meds.  - Fetal arrhythmia. Fetal Echo scheduled - Conceived by IVF  Gets [place] at [facility].   Nursing Staff Provider  Office Location  [place] Dating  IVF  Language  English Anatomy US  Nml female, posterior previa  Flu Vaccine   04/13/18 Genetic Screen  First Screen: Negative AFP: negative   TDaP vaccine    Hgb A1C or  GTT Early  Third trimester   Rhogam     LAB RESULTS   Feeding Plan  both Blood Type --/--/A POS (12/25 1745)   Contraception  undecided (does not want pills) Antibody NEG (12/25 1745)  Circumcision  yes  Rubella 9.95 (09/27 1130)  Pediatrician   undecided RPR Non Reactive (09/27 1130)   Support Person Husband HBsAg Negative (09/27 1130)   Prenatal Classes  HIV Non Reactive (09/27 1130)    GBS       Pap 08/29/2017 Negative cytology &HPV    Hgb Electro      CF     SMA     Waterbirth  [ ]  Class [ ]  Consent [ ]  CNM visit    Patient Active Problem List   Diagnosis Date Noted  . Antepartum hemorrhage from placenta previa in second trimester 08/02/2018  . Fetal arrhythmia (frequent PACs) affecting management of mother 07/17/2018  . Placenta previa antepartum 06/05/2018  . Chronic hypertension during pregnancy, antepartum 05/05/2018  . Supervision of high risk pregnancy, antepartum 04/12/2018  . Pregnancy resulting from in vitro fertilization, antepartum 04/12/2018  . History of pulmonary embolus (PE) 08/12/2017  . History of DVT (deep vein thrombosis) 08/12/2017  . Vitamin D deficiency 08/12/2017  . Goiter 08/12/2017  . Iron deficiency anemia due to chronic blood loss 10/10/2015  . Gastroesophageal reflux disease 12/06/2014  . Hyperlipidemia 12/06/2014  . Essential hypertension 09/16/2014      OB History    Gravida  1   Para  0   Term  0   Preterm  0   AB  0   Living  0     SAB  0   TAB  0   Ectopic  0   Multiple  0   Live Births  0          Past Medical History:  Diagnosis Date  . Anemia   . DVT (deep venous thrombosis) ([place]) 2017  . Essential hypertension 09/16/2014  . Hyperlipidemia   . Pulmonary embolism ([place]) 09/2015   Past Surgical History:  Procedure Laterality Date  . CHOLECYSTECTOMY     Family History: family history includes Cancer in her father; Diabetes in her father; Hypertension in her father and mother. Social History:  reports that she has never smoked. She has never used smokeless tobacco. She reports previous alcohol use. She reports that she does not use drugs.     Maternal Diabetes: No Genetic Screening: Normal Maternal Ultrasounds/Referrals: Abnormal:  Findings:   Other:Posterior previa Fetal Ultrasounds or other Referrals:  Fetal echo scheduled for fetal arrhythmia  Maternal Substance Abuse:  No Significant Maternal Medications:  Meds include: Other: Lovenox Significant Maternal Lab Results:  None Other Comments:  Bleeding previa, IVF pregnancy  Review of Systems  Constitutional: Negative for chills and fever.  Gastrointestinal: Positive for abdominal pain.  Genitourinary: Positive for frequency. Negative for dysuria, hematuria and urgency.       Pos for vaginal bleeding. Neg for LOF.    Maternal Medical History:  Reason for admission: Vaginal bleeding.   Contractions: Onset was 2 days ago.   Frequency: irregular.    Fetal activity: Perceived fetal activity is normal.    Prenatal complications: Bleeding, PIH, placental abnormality and thrombophilia.   Prenatal Complications - Diabetes: none.      Blood pressure 136/81, pulse 99, temperature 98.5 F (36.9 C), temperature source Oral, resp. rate 18, weight 71.2 kg, last menstrual period 09/03/2017, SpO2 100 %. Maternal Exam:  Uterine Assessment:  Contraction strength is mild.  Contraction frequency is regular.   Abdomen: Patient reports no abdominal tenderness. Fundal height is 25 cm.    Introitus: Normal vulva. Normal vagina.  Vagina is negative for discharge.  Ferning test: negative.   Pelvis: adequate for delivery.   Cervix: Cervix evaluated by sterile speculum exam.     Fetal Exam Fetal Monitor Review: Mode: ultrasound.   Baseline rate: 150.  Variability: moderate (6-25 bpm).   Pattern: accelerations present and no decelerations.    Fetal State Assessment: Category I - tracings are normal.     Physical Exam  Nursing note and vitals reviewed. Constitutional: She is oriented to person, place, and time. She appears well-developed and well-nourished. No distress.  HENT:  Head: Normocephalic.  Eyes: No scleral icterus.  Cardiovascular: Normal rate.  Respiratory: Effort normal. No respiratory distress.  GI: Soft. She exhibits no distension. There is no abdominal tenderness.  Genitourinary:    Vulva and uterus normal.  There is no lesion on the right labia. There is no lesion on the left labia.    Vaginal bleeding (~30 cc'c of dark red blood in vault. Scant active bleeding. Small old clot in os. Os incompletely visualized  but not obviously dilated. ) present.     No vaginal discharge.  There is bleeding (~30 cc'c of dark red blood in vault. Scant active bleeding. Small old clot in os. Os incompletely visualized  but not obviously dilated. ) in the vagina.  Musculoskeletal:        General: No edema.  Neurological: She is alert and oriented to person, place, and time.  Skin: Skin is warm and dry.  Psychiatric: She has a normal mood and affect.    Prenatal labs: ABO, Rh: --/--/A POS (12/25 1745) Antibody: NEG (12/25 1745) Rubella: 9.95 (09/27 1130) RPR: Non Reactive (09/27 1130)  HBsAg: Negative (09/27 1130)  HIV: Non Reactive (09/27 1130)  GBS:     Assessment: 1. Bleeding Posterior Previa  2. Fetal Wellbeing:  Category 1  3. CHNT, stable off meds 4. Hx PE and DVT 5. Preterm contractions and possible short cervix. 6. 25.5 week IUP  7. Fetal arrhythmia  Plan:  1. Admit to [facility] specialty care per consult with Dr. Elonda Husky and Dr. Donalee Citrin. 2. N.p.o. until after patient is seen by Dr. Elonda Husky 3. Routine antenatal orders 4. Magnesium Sulfate for neuroprotection 5. BMZ x 2 6. Continue Lovenox 7. NICU consult ordered. Discussed admission w/ Dr. Percell Miller.  Manya Silvas 08/02/2018, 7:19 PM

## 2018-08-02 NOTE — Consult Note (Signed)
[facility]  Prenatal Consult       08/02/2018  7:50 PM   I was asked by Dr. Elonda Husky to consult on this patient for possible preterm delivery.  I had the pleasure of meeting with Melissa Ruiz and her husband today.  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  She is a 32 y/o G1P0 at 53 and 5/[redacted] weeks gestation who was admitted today for bleeding in the setting of placenta previa.  She also has a shortened cervix and is having contractions.  The fetus is a female.  She will be admitted for at least the next 7 days for observation of placenta previa.  She has been started on magnesium and has received the first dose of betamethasone today.  She has a past medical history of a pulmonary embolism, for which she is on Lovenox.    I explained that we would universally recommend resuscitating a typical infant >[redacted] weeks gestation because the chance of survival with a good outcome is so high. I explained that the neonatal intensive care team would be present for the delivery and outlined the likely delivery room course for this baby including routine resuscitation and NRP-guided approaches to the treatment of respiratory distress. We discussed other common problems associated with prematurity including respiratory distress syndrome/CLD, apnea, feeding issues, temperature regulation, and infection risk.  We discussed IVH/PVL, ROP, and NEC and that these are complications associated with prematurity, but that if her pregnancy should progress to at least 30 weeks these would be uncommon.    We discussed the average length of stay but I noted that the actual LOS would depend on the severity of problems encountered and response to treatments.  We discussed visitation policies and the resources available while her child is in the hospital.  We discussed the importance of good nutrition and various methods of providing nutrition (parenteral hyperalimentation, gavage feedings and/or oral  feeding). We discussed the benefits of human milk. I encouraged breast feeding and pumping soon after birth and outlined resources that are available to support breast feeding.  She does intend on breastfeeding her infant.    Thank you for involving [place] in the care of this patient. A member of our team will be available should the family have additional questions.  Time for consultation approximately 45 minutes.   _____________________ Electronically Signed By: Clinton Gallant, MD Neonatologist

## 2018-08-03 LAB — CBC
HCT: 33 % — ABNORMAL LOW (ref 36.0–46.0)
Hemoglobin: 11.2 g/dL — ABNORMAL LOW (ref 12.0–15.0)
MCH: 32.3 pg (ref 26.0–34.0)
MCHC: 33.9 g/dL (ref 30.0–36.0)
MCV: 95.1 fL (ref 80.0–100.0)
PLATELETS: 285 10*3/uL (ref 150–400)
RBC: 3.47 MIL/uL — ABNORMAL LOW (ref 3.87–5.11)
RDW: 13.7 % (ref 11.5–15.5)
WBC: 11.9 10*3/uL — ABNORMAL HIGH (ref 4.0–10.5)
nRBC: 0 % (ref 0.0–0.2)

## 2018-08-03 MED ORDER — HYDROCODONE-ACETAMINOPHEN 5-325 MG PO TABS
1.0000 | ORAL_TABLET | Freq: Once | ORAL | Status: AC
  Administered 2018-08-03: 1 via ORAL
  Filled 2018-08-03: qty 1

## 2018-08-03 MED ORDER — FAMOTIDINE 20 MG PO TABS
20.0000 mg | ORAL_TABLET | Freq: Two times a day (BID) | ORAL | Status: DC
  Administered 2018-08-03: 20 mg via ORAL
  Filled 2018-08-03: qty 1

## 2018-08-03 MED ORDER — ASPIRIN 81 MG PO CHEW
81.0000 mg | CHEWABLE_TABLET | ORAL | Status: DC
  Administered 2018-08-03 – 2018-08-08 (×6): 81 mg via ORAL
  Filled 2018-08-03 (×7): qty 1

## 2018-08-03 MED ORDER — ENOXAPARIN SODIUM 40 MG/0.4ML ~~LOC~~ SOLN: 40.0000 mg | SUBCUTANEOUS | Status: DC

## 2018-08-03 MED ORDER — FAMOTIDINE 20 MG PO TABS
20.0000 mg | ORAL_TABLET | Freq: Two times a day (BID) | ORAL | Status: DC
  Administered 2018-08-03 – 2018-08-08 (×11): 20 mg via ORAL
  Filled 2018-08-03 (×11): qty 1

## 2018-08-03 NOTE — Progress Notes (Signed)
Patient ID: Melissa Ruiz, female   DOB: 1986-05-02, 32 y.o.   MRN: [id number] [place]) NOTE  Melissa Ruiz is a 32 y.o. G1P0000 with Estimated Date of Delivery: 11/10/18   By  date of embryo transfer [redacted]w[redacted]d  who is admitted for placenta previa with acute bleeding.    Fetal presentation is unstable, transverse. Length of Stay:  1  Days  Date of admission:08/02/2018  Subjective: Minimal spotting, browninsh now Pain has resolved for now Patient reports the fetal movement as active. Patient reports uterine contraction  activity as as described. Patient reports  vaginal bleeding as scant staining. Patient describes fluid per vagina as None.  Vitals:  Blood pressure 126/79, pulse (!) 107, temperature 98.4 F (36.9 C), temperature source Oral, resp. rate 16, weight 71.2 kg, last menstrual period 09/03/2017, SpO2 99 %. Vitals:   08/03/18 0404 08/03/18 0500 08/03/18 0600 08/03/18 0700  BP: 126/79     Pulse: (!) 107     Resp: 16 17 16 16   Temp: 98.4 F (36.9 C)     TempSrc: Oral     SpO2: 99%     Weight:       Physical Examination:  General appearance - alert, well appearing, and in no distress Abdomen - soft, nontender, nondistended, no masses or organomegaly Fundal Height:  size equals dates Pelvic Exam:  examination not indicated Cervical Exam: Not evaluated. Extremities: extremities normal, atraumatic, no cyanosis or edema with DTRs  Membranes:  Fetal Monitoring:  Reassuring for 34 weeks     Labs:  Results for orders placed or performed during the hospital encounter of 08/02/18 (from the past 24 hour(s))  Urinalysis, Routine w reflex microscopic   Collection Time: 08/02/18  5:05 PM  Result Value Ref Range   Color, Urine YELLOW YELLOW   APPearance CLEAR CLEAR   Specific Gravity, Urine 1.009 1.005 - 1.030   pH 6.0 5.0 - 8.0   Glucose, UA NEGATIVE NEGATIVE mg/dL   Hgb urine dipstick LARGE (A) NEGATIVE   Bilirubin Urine  NEGATIVE NEGATIVE   Ketones, ur 5 (A) NEGATIVE mg/dL   Protein, ur NEGATIVE NEGATIVE mg/dL   Nitrite NEGATIVE NEGATIVE   Leukocytes, UA NEGATIVE NEGATIVE   RBC / HPF 0-5 0 - 5 RBC/hpf   WBC, UA 0-5 0 - 5 WBC/hpf   Bacteria, UA RARE (A) NONE SEEN   Squamous Epithelial / LPF 0-5 0 - 5   Mucus PRESENT   CBC   Collection Time: 08/02/18  5:45 PM  Result Value Ref Range   WBC 12.1 (H) 4.0 - 10.5 K/uL   RBC 3.42 (L) 3.87 - 5.11 MIL/uL   Hemoglobin 11.2 (L) 12.0 - 15.0 g/dL   HCT 32.8 (L) 36.0 - 46.0 %   MCV 95.9 80.0 - 100.0 fL   MCH 32.7 26.0 - 34.0 pg   MCHC 34.1 30.0 - 36.0 g/dL   RDW 13.7 11.5 - 15.5 %   Platelets 292 150 - 400 K/uL   nRBC 0.0 0.0 - 0.2 %  Type and screen   Collection Time: 08/02/18  5:45 PM  Result Value Ref Range   ABO/RH(D) A POS    Antibody Screen NEG    Sample Expiration      08/05/2018 Performed at [facility], [address] [place], [place] [address]   ABO/Rh   Collection Time: 08/02/18  5:45 PM  Result Value Ref Range   ABO/RH(D)      A POS Performed at [facility],  [address] [place], [place] [address]     Imaging Studies:     Currently EPIC will not allow sonographic studies to automatically populate into notes.  In the meantime, copy and paste results into note or free text.  Medications:  Scheduled . betamethasone acetate-betamethasone sodium phosphate  12 mg Intramuscular Once  . docusate sodium  100 mg Oral Daily  . enoxaparin (LOVENOX) injection  40 mg Subcutaneous Q24H  . prenatal multivitamin  1 tablet Oral Q1200   I have reviewed the patient's current medications.  ASSESSMENT: G1P0000 [redacted]w[redacted]d Estimated Date of Delivery: 11/10/18  Placenta previa, posterior with acute bleeding 08/02/2018 Uterine activity either primarily or secondarily with shortened cervix Fibroids with intermittent pain History of DVT/PE on prophylactic dosing lovenox, held last night CHTN on aspirin Patient Active Problem List   Diagnosis Date  Noted  . Antepartum hemorrhage from placenta previa in second trimester 08/02/2018  . Fetal arrhythmia (frequent PACs) affecting management of mother 07/17/2018  . Placenta previa antepartum 06/05/2018  . Chronic hypertension during pregnancy, antepartum 05/05/2018  . Supervision of high risk pregnancy, antepartum 04/12/2018  . Pregnancy resulting from in vitro fertilization, antepartum 04/12/2018  . History of pulmonary embolus (PE) 08/12/2017  . History of DVT (deep vein thrombosis) 08/12/2017  . Vitamin D deficiency 08/12/2017  . Goiter 08/12/2017  . Iron deficiency anemia due to chronic blood loss 10/10/2015  . Gastroesophageal reflux disease 12/06/2014  . Hyperlipidemia 12/06/2014  . Essential hypertension 09/16/2014    PLAN: >continue magnesium for 24 hours >S/P betamethasone 1/2, 2nd dose tonight >Continue in house observation for minimum period of 7 days >restart lovenox tonight if no further bleeding >restart ASA if no further bleeding   H  08/03/2018,7:35 AM

## 2018-08-03 NOTE — Plan of Care (Signed)
  Problem: Education: Goal: Knowledge of disease or condition will improve Outcome: Progressing   Problem: Pain Management: Goal: Relief or control of pain will improve Outcome: Progressing   Problem: Pain Managment: Goal: General experience of comfort will improve Outcome: Progressing

## 2018-08-04 MED ORDER — SODIUM CHLORIDE 0.9% FLUSH
3.0000 mL | Freq: Two times a day (BID) | INTRAVENOUS | Status: DC
  Administered 2018-08-04 – 2018-08-07 (×6): 3 mL via INTRAVENOUS

## 2018-08-04 NOTE — Progress Notes (Signed)
Patient ID: Melissa Ruiz, female   DOB: August 04, 1986, 32 y.o.   MRN: [id number] [place]) NOTE  Melissa Ruiz is a 32 y.o. G1P0000 at [redacted]w[redacted]d by best clinical estimate who is admitted for vaginal bleeding.   Fetal presentation is transverse. Length of Stay:  2  Days  Subjective: Only brown spotting noted Patient reports the fetal movement as active. Patient reports uterine contraction  activity as none. Patient reports  vaginal bleeding as scant staining. Patient describes fluid per vagina as None.  Vitals:  Blood pressure 123/77, pulse 79, temperature 98.1 F (36.7 C), temperature source Oral, resp. rate 18, weight 71.2 kg, last menstrual period 09/03/2017, SpO2 99 %. Physical Examination:  General appearance - alert, well appearing, and in no distress Heart - normal rate and regular rhythm Abdomen - soft, nontender, nondistended Fundal Height:  size equals dates Cervical Exam: Not evaluated.  Membranes:intact  Fetal Monitoring:  Baseline: 140 bpm, Variability: Good {> 6 bpm), Accelerations: Reactive and Decelerations: Absent  Labs:  No results found for this or any previous visit (from the past 24 hour(s)).    Medications:  Scheduled . aspirin  81 mg Oral Q24H  . docusate sodium  100 mg Oral Daily  . enoxaparin (LOVENOX) injection  40 mg Subcutaneous Q24H  . famotidine  20 mg Oral BID AC  . prenatal multivitamin  1 tablet Oral Q1200   I have reviewed the patient's current medications.  ASSESSMENT: Patient Active Problem List   Diagnosis Date Noted  . Antepartum hemorrhage from placenta previa in second trimester 08/02/2018  . Fetal arrhythmia (frequent PACs) affecting management of mother 07/17/2018  . Placenta previa antepartum 06/05/2018  . Chronic hypertension during pregnancy, antepartum 05/05/2018  . Supervision of high risk pregnancy, antepartum 04/12/2018  . Pregnancy resulting from in vitro fertilization,  antepartum 04/12/2018  . History of pulmonary embolus (PE) 08/12/2017  . History of DVT (deep vein thrombosis) 08/12/2017  . Vitamin D deficiency 08/12/2017  . Goiter 08/12/2017  . Iron deficiency anemia due to chronic blood loss 10/10/2015  . Gastroesophageal reflux disease 12/06/2014  . Hyperlipidemia 12/06/2014  . Essential hypertension 09/16/2014    PLAN: Observation in hospital for at least 7 days  Melissa Ruiz 08/04/2018,12:10 PM

## 2018-08-04 NOTE — Plan of Care (Signed)
  Problem: Education: Goal: Knowledge of disease or condition will improve Outcome: Progressing   Problem: Pain Management: Goal: Relief or control of pain will improve Outcome: Progressing   Problem: Pain Managment: Goal: General experience of comfort will improve Outcome: Progressing   Problem: Safety: Goal: Ability to remain free from injury will improve Outcome: Progressing

## 2018-08-05 DIAGNOSIS — O368390 Maternal care for abnormalities of the fetal heart rate or rhythm, unspecified trimester, not applicable or unspecified: Secondary | ICD-10-CM

## 2018-08-05 DIAGNOSIS — O4412 Placenta previa with hemorrhage, second trimester: Secondary | ICD-10-CM

## 2018-08-05 MED ORDER — LACTATED RINGERS IV SOLN
INTRAVENOUS | Status: AC
  Administered 2018-08-05: 07:00:00 via INTRAVENOUS

## 2018-08-05 MED ORDER — LACTATED RINGERS IV BOLUS
1000.0000 mL | Freq: Once | INTRAVENOUS | Status: AC
  Administered 2018-08-05: 1000 mL via INTRAVENOUS

## 2018-08-05 MED ORDER — NIFEDIPINE 10 MG PO CAPS
20.0000 mg | ORAL_CAPSULE | Freq: Once | ORAL | Status: AC
  Administered 2018-08-05: 20 mg via ORAL
  Filled 2018-08-05: qty 2

## 2018-08-05 MED ORDER — NIFEDIPINE ER OSMOTIC RELEASE 30 MG PO TB24
30.0000 mg | ORAL_TABLET | Freq: Two times a day (BID) | ORAL | Status: DC
  Administered 2018-08-05 – 2018-08-08 (×7): 30 mg via ORAL
  Filled 2018-08-05 (×7): qty 1

## 2018-08-05 NOTE — Progress Notes (Signed)
Patient ID: Melissa Ruiz, female   DOB: 02-Jul-1986, 32 y.o.   MRN: [id number] [place]) NOTE  Melissa Ruiz is a 32 y.o. G1P0000 at [redacted]w[redacted]d by best clinical estimate who is admitted for vaginal bleeding in the setting of known posterior previa and shortened cervix.   Fetal presentation is transverse. Length of Stay:  3  Days  Subjective: Only brown spotting noted. Had some contractions this morning, received IV fluid bolus and Procardia.  She reports they are not painful. No bleeding, no LOF. Good FM. Patient reports the fetal movement as active. Patient reports uterine contraction  activity as irregular contractions currently Patient reports  vaginal bleeding as scant staining. Patient describes fluid per vagina as None.  Vitals:  Blood pressure 119/66, pulse 90, temperature 98.5 F (36.9 C), temperature source Oral, resp. rate 16, weight 71.2 kg, last menstrual period 09/03/2017, SpO2 98 %. Physical Examination: General appearance - alert, well appearing, and in no distress Heart - normal rate and regular rhythm Abdomen - soft, nontender, nondistended Fundal Height:  size equals dates Cervical Exam: Not evaluated.  Membranes:intact  Fetal Monitoring:  Baseline: 140 bpm, Variability: Moderate {> 6 bpm), Accelerations: 10 x 10 and Decelerations: Absent Toco: Uterine irritability   Labs:  Results for orders placed or performed during the hospital encounter of 08/02/18 (from the past 72 hour(s))  Urinalysis, Routine w reflex microscopic     Status: Abnormal   Collection Time: 08/02/18  5:05 PM  Result Value Ref Range   Color, Urine YELLOW YELLOW   APPearance CLEAR CLEAR   Specific Gravity, Urine 1.009 1.005 - 1.030   pH 6.0 5.0 - 8.0   Glucose, UA NEGATIVE NEGATIVE mg/dL   Hgb urine dipstick LARGE (A) NEGATIVE   Bilirubin Urine NEGATIVE NEGATIVE   Ketones, ur 5 (A) NEGATIVE mg/dL   Protein, ur NEGATIVE NEGATIVE mg/dL   Nitrite NEGATIVE NEGATIVE   Leukocytes, UA NEGATIVE NEGATIVE   RBC / HPF 0-5 0 - 5 RBC/hpf   WBC, UA 0-5 0 - 5 WBC/hpf   Bacteria, UA RARE (A) NONE SEEN   Squamous Epithelial / LPF 0-5 0 - 5   Mucus PRESENT     Comment: Performed at [facility], [address], [place], [place] [address]  CBC     Status: Abnormal   Collection Time: 08/02/18  5:45 PM  Result Value Ref Range   WBC 12.1 (H) 4.0 - 10.5 K/uL   RBC 3.42 (L) 3.87 - 5.11 MIL/uL   Hemoglobin 11.2 (L) 12.0 - 15.0 g/dL   HCT 32.8 (L) 36.0 - 46.0 %   MCV 95.9 80.0 - 100.0 fL   MCH 32.7 26.0 - 34.0 pg   MCHC 34.1 30.0 - 36.0 g/dL   RDW 13.7 11.5 - 15.5 %   Platelets 292 150 - 400 K/uL   nRBC 0.0 0.0 - 0.2 %    Comment: Performed at [facility], [address] [place], [place] [address]  Type and screen     Status: None   Collection Time: 08/02/18  5:45 PM  Result Value Ref Range   ABO/RH(D) A POS    Antibody Screen NEG    Sample Expiration      08/05/2018 Performed at [facility], [address], [place], [place] [address]   ABO/Rh     Status: None   Collection Time: 08/02/18  5:45 PM  Result Value Ref Range   ABO/RH(D)      A POS Performed at  [facility], [address] [place], [place] [address]   CBC     Status: Abnormal   Collection Time: 08/03/18  8:58 AM  Result Value Ref Range   WBC 11.9 (H) 4.0 - 10.5 K/uL   RBC 3.47 (L) 3.87 - 5.11 MIL/uL   Hemoglobin 11.2 (L) 12.0 - 15.0 g/dL   HCT 33.0 (L) 36.0 - 46.0 %   MCV 95.1 80.0 - 100.0 fL   MCH 32.3 26.0 - 34.0 pg   MCHC 33.9 30.0 - 36.0 g/dL   RDW 13.7 11.5 - 15.5 %   Platelets 285 150 - 400 K/uL   nRBC 0.0 0.0 - 0.2 %    Comment: Performed at [facility], [address] [place], [place] [address]      Medications:  Scheduled . aspirin  81 mg Oral Q24H  . docusate sodium  100 mg Oral Daily  . enoxaparin (LOVENOX) injection  40 mg Subcutaneous Q24H  . famotidine  20 mg Oral BID AC  . NIFEdipine  30 mg Oral BID  . prenatal  multivitamin  1 tablet Oral Q1200  . sodium chloride flush  3 mL Intravenous Q12H   I have reviewed the patient's current medications.  ASSESSMENT: Principal Problem:   Antepartum hemorrhage from placenta previa in second trimester Active Problems:   Essential hypertension   History of pulmonary embolus (PE)   History of DVT (deep vein thrombosis)   Supervision of high risk pregnancy, antepartum   Chronic hypertension during pregnancy, antepartum   Fetal arrhythmia (frequent PACs) affecting management of mother    PLAN: Procardia added to help with contractions Has completed betamethasone regimen Continue Lovenox for VTE prophylaxis Reassuring FHR tracing Continue to monitor BP Observation in hospital for at least 7 days  [name], MD 08/05/2018,6:44 AM

## 2018-08-06 LAB — TYPE AND SCREEN
ABO/RH(D): A POS
Antibody Screen: NEGATIVE

## 2018-08-06 NOTE — Progress Notes (Signed)
[place] PROGRESS NOTE  Melissa Ruiz is a 32 y.o. G1P0000 at [redacted]w[redacted]d who is admitted for bleeding 2/2 posterior previa and shortened cervix.  Estimated Date of Delivery: 11/10/18 Fetal presentation is transverse.  Length of Stay:  4 Days. Admitted 08/02/2018  Subjective: Patient reports normal fetal movement.  She denies uterine contractions, reports dark brown spotting with wiping. Denies leaking of fluid. Reports she feels pelvic pressure when she has a full bladder that improves after she voids.   Vitals:  Blood pressure 110/63, pulse 89, temperature 97.9 F (36.6 C), temperature source Oral, resp. rate 16, height 5\' 5"  (1.651 m), weight 71.7 kg, last menstrual period 09/03/2017, SpO2 99 %. Physical Examination: CONSTITUTIONAL: Well-developed, well-nourished female in no acute distress.  HENT:  Normocephalic, atraumatic, External right and left ear normal. Oropharynx is clear and moist EYES: Conjunctivae and EOM are normal. Pupils are equal, round, and reactive to light. No scleral icterus.  NECK: Normal range of motion, supple, no masses. SKIN: Skin is warm and dry. No rash noted. Not diaphoretic. No erythema. No pallor. [place]: Alert and oriented to person, place, and time. Normal reflexes, muscle tone coordination. No cranial nerve deficit noted. PSYCHIATRIC: Normal mood and affect. Normal behavior. Normal judgment and thought content. CARDIOVASCULAR: Normal heart rate noted, regular rhythm RESPIRATORY: Effort and breath sounds normal, no problems with respiration noted MUSCULOSKELETAL: Normal range of motion. No edema and no tenderness. ABDOMEN: Soft, nontender, nondistended, gravid. CERVIX: deferred  Fetal monitoring: FHR: 150s bpm, Variability: moderate, Accelerations: Present, Decelerations: Absent  Uterine activity: no contractions per hour  Results for orders placed or performed during the hospital encounter of 08/02/18 (from the past 48  hour(s))  Type and screen     Status: None   Collection Time: 08/05/18 10:40 PM  Result Value Ref Range   ABO/RH(D) A POS    Antibody Screen NEG    Sample Expiration      08/08/2018 Performed at [facility], [address], [place], [place] [address]     No results found.  Current scheduled medications . aspirin  81 mg Oral Q24H  . docusate sodium  100 mg Oral Daily  . enoxaparin (LOVENOX) injection  40 mg Subcutaneous Q24H  . famotidine  20 mg Oral BID AC  . NIFEdipine  30 mg Oral BID  . prenatal multivitamin  1 tablet Oral Q1200  . sodium chloride flush  3 mL Intravenous Q12H    I have reviewed the patient's current medications.  ASSESSMENT: Principal Problem:   Antepartum hemorrhage from placenta previa in second trimester Active Problems:   Essential hypertension   History of pulmonary embolus (PE)   History of DVT (deep vein thrombosis)   Supervision of high risk pregnancy, antepartum   Chronic hypertension during pregnancy, antepartum   Fetal arrhythmia (frequent PACs) affecting management of mother   PLAN: Procardia as ordered lovenox ppx S/p BTMZ 12/25-26 Cont to monitor BP To complete 7 day hospital course Reassuring fetal monitoring Reviewed plan for hospital course of 7 days, unless she bleeds again   Continue routine antenatal care.   Feliz Beam, M.D. [facility] (Faculty Practice)  08/06/2018 9:43 AM

## 2018-08-07 ENCOUNTER — Encounter: Payer: Self-pay | Admitting: *Deleted

## 2018-08-07 LAB — CBC
HCT: 31.1 % — ABNORMAL LOW (ref 36.0–46.0)
Hemoglobin: 10.4 g/dL — ABNORMAL LOW (ref 12.0–15.0)
MCH: 32.3 pg (ref 26.0–34.0)
MCHC: 33.4 g/dL (ref 30.0–36.0)
MCV: 96.6 fL (ref 80.0–100.0)
Platelets: 299 10*3/uL (ref 150–400)
RBC: 3.22 MIL/uL — ABNORMAL LOW (ref 3.87–5.11)
RDW: 13.6 % (ref 11.5–15.5)
WBC: 8.1 10*3/uL (ref 4.0–10.5)
nRBC: 0 % (ref 0.0–0.2)

## 2018-08-07 LAB — TYPE AND SCREEN
ABO/RH(D): A POS
Antibody Screen: NEGATIVE

## 2018-08-07 LAB — CREATININE, SERUM
Creatinine, Ser: 0.54 mg/dL (ref 0.44–1.00)
GFR calc Af Amer: >60 mL/min (ref >60)
GFR calc non Af Amer: >60 mL/min (ref >60)

## 2018-08-07 NOTE — Progress Notes (Signed)
Melissa Ruiz ANTEPARTUM PROGRESS NOTE  Melissa Ruiz is a 32 y.o. G1P0000 at [redacted]w[redacted]d who is admitted for bleeding 2/2 posterior previa and shortened cervix.  Estimated Date of Delivery: 11/10/18 Fetal presentation is transverse.  Length of Stay:  5 Days. Admitted 08/02/2018  Subjective: Patient reports normal fetal movement.  She denies uterine contractions, reports dark brown spotting with wiping. Denies leaking of fluid.    Vitals:  Blood pressure 110/63, pulse 92, temperature 98.5 F (36.9 C), resp. rate 18, height 5\' 5"  (1.651 m), weight 71.7 kg, last menstrual period 09/03/2017, SpO2 99 %. Physical Examination: CONSTITUTIONAL: Well-developed, well-nourished female in no acute distress.  HENT:  Normocephalic, atraumatic, External right and left ear normal. Oropharynx is clear and moist EYES: Conjunctivae and EOM are normal. Pupils are equal, round, and reactive to light. No scleral icterus.  NECK: Normal range of motion, supple, no masses. SKIN: Skin is warm and dry. No rash noted. Not diaphoretic. No erythema. No pallor. [place]: Alert and oriented to person, place, and time. Normal reflexes, muscle tone coordination. No cranial nerve deficit noted. PSYCHIATRIC: Normal mood and affect. Normal behavior. Normal judgment and thought content. CARDIOVASCULAR: Normal heart rate noted, regular rhythm RESPIRATORY: Effort and breath sounds normal, no problems with respiration noted MUSCULOSKELETAL: Normal range of motion. No edema and no tenderness. ABDOMEN: Soft, nontender, nondistended, gravid. CERVIX: deferred  Fetal monitoring: FHR: 150s bpm, Variability: moderate, Accelerations: Present, Decelerations: Absent  Uterine activity: no contractions per hour  Results for orders placed or performed during the hospital encounter of 08/02/18 (from the past 48 hour(s))  Type and screen     Status: None   Collection Time: 08/05/18 10:40 PM  Result Value Ref Range   ABO/RH(D) A  POS    Antibody Screen NEG    Sample Expiration      08/08/2018 Performed at [facility], [address], [place], [place] [address]   Type and screen [place]     Status: None   Collection Time: 08/07/18  5:16 AM  Result Value Ref Range   ABO/RH(D) A POS    Antibody Screen NEG    Sample Expiration      08/10/2018 Performed at [facility], [address], [place], [place] [address]   CBC     Status: Abnormal   Collection Time: 08/07/18  5:18 AM  Result Value Ref Range   WBC 8.1 4.0 - 10.5 K/uL   RBC 3.22 (L) 3.87 - 5.11 MIL/uL   Hemoglobin 10.4 (L) 12.0 - 15.0 g/dL   HCT 31.1 (L) 36.0 - 46.0 %   MCV 96.6 80.0 - 100.0 fL   MCH 32.3 26.0 - 34.0 pg   MCHC 33.4 30.0 - 36.0 g/dL   RDW 13.6 11.5 - 15.5 %   Platelets 299 150 - 400 K/uL   nRBC 0.0 0.0 - 0.2 %    Comment: Performed at [facility], [address], [place], [place] [address]  Creatinine, serum     Status: None   Collection Time: 08/07/18  5:18 AM  Result Value Ref Range   Creatinine, Ser 0.54 0.44 - 1.00 mg/dL   GFR calc non Af Amer >60 >60 mL/min   GFR calc Af Amer >60 >60 mL/min    Comment: Performed at [facility], [address], [place], [place] [address]    No results found.  Current scheduled medications . aspirin  81 mg Oral Q24H  . docusate sodium  100 mg Oral Daily  .  enoxaparin (LOVENOX) injection  40 mg Subcutaneous Q24H  . famotidine  20 mg Oral BID AC  . NIFEdipine  30 mg Oral BID  . prenatal multivitamin  1 tablet Oral Q1200  . sodium chloride flush  3 mL Intravenous Q12H    I have reviewed the patient's current medications.  ASSESSMENT: Principal Problem:   Antepartum hemorrhage from placenta previa in second trimester Active Problems:   Essential hypertension   History of pulmonary embolus (PE)   History of DVT (deep vein thrombosis)   Supervision of high risk pregnancy, antepartum   Chronic hypertension during pregnancy, antepartum   Fetal  arrhythmia (frequent PACs) affecting management of mother   PLAN: Procardia as ordered Lovenox for VTE prophylaxis S/p BTMZ 12/25-26 Cont to monitor BP Reassuring fetal monitoring Will get ultrasound to recheck cervical length tomorrow, if stable and she is stable, will consider disposition. Continue routine antenatal care.   [name], MD, [place] [facility], [place]

## 2018-08-08 ENCOUNTER — Inpatient Hospital Stay (HOSPITAL_BASED_OUTPATIENT_CLINIC_OR_DEPARTMENT_OTHER): Payer: 59

## 2018-08-08 ENCOUNTER — Encounter: Payer: Self-pay | Admitting: Obstetrics & Gynecology

## 2018-08-08 DIAGNOSIS — Z3A26 26 weeks gestation of pregnancy: Secondary | ICD-10-CM

## 2018-08-08 DIAGNOSIS — O368320 Maternal care for abnormalities of the fetal heart rate or rhythm, second trimester, not applicable or unspecified: Secondary | ICD-10-CM

## 2018-08-08 DIAGNOSIS — O09812 Supervision of pregnancy resulting from assisted reproductive technology, second trimester: Secondary | ICD-10-CM

## 2018-08-08 DIAGNOSIS — O4412 Placenta previa with hemorrhage, second trimester: Secondary | ICD-10-CM

## 2018-08-08 DIAGNOSIS — O2232 Deep phlebothrombosis in pregnancy, second trimester: Secondary | ICD-10-CM

## 2018-08-08 DIAGNOSIS — O10012 Pre-existing essential hypertension complicating pregnancy, second trimester: Secondary | ICD-10-CM

## 2018-08-08 DIAGNOSIS — O3412 Maternal care for benign tumor of corpus uteri, second trimester: Secondary | ICD-10-CM

## 2018-08-08 DIAGNOSIS — O4692 Antepartum hemorrhage, unspecified, second trimester: Secondary | ICD-10-CM

## 2018-08-08 MED ORDER — DOCUSATE SODIUM 100 MG PO CAPS: 100.0000 mg | ORAL_CAPSULE | Freq: Two times a day (BID) | ORAL | 2 refills | Status: DC | PRN

## 2018-08-08 MED ORDER — HYDROCODONE-ACETAMINOPHEN 5-325 MG PO TABS: 1.0000 | ORAL_TABLET | Freq: Four times a day (QID) | ORAL | 0 refills | Status: DC | PRN

## 2018-08-08 MED ORDER — NIFEDIPINE ER 30 MG PO TB24: 30.0000 mg | ORAL_TABLET | Freq: Two times a day (BID) | ORAL | 2 refills | Status: DC

## 2018-08-08 NOTE — Discharge Summary (Signed)
Antenatal Physician Discharge Summary  Patient ID: Melissa Ruiz MRN: [id number] DOB/AGE: 1985/11/30 32 y.o.  Admit date: 08/02/2018 Discharge date: 08/08/2018  Admission Diagnoses and Discharge Diagnoses:  Principal Problem:   Antepartum hemorrhage from placenta previa in second trimester Active Problems:   Essential hypertension   History of pulmonary embolus (PE)   History of DVT (deep vein thrombosis)   Supervision of high risk pregnancy, antepartum   Chronic hypertension during pregnancy, antepartum   Fetal arrhythmia (frequent PACs) affecting management of mother  Prenatal Procedures: NST and ultrasound  Consults: Neonatology, Maternal Fetal Medicine  Hospital Course:  This is a 32 y.o. G1P0000 with IUP at [redacted]w[redacted]d admitted for episode of bleeding in the setting of known posterior previa.  She was admitted with contractions, and ultrasound initially noted a possibly shortened cervix with 1.4 cm length, but views were obscured. No leaking of fluid   She was initially started on magnesium sulfate for tocolysis and neuroprotection and also received betamethasone x 2 doses.  Her tocolysis was transitioned to Procardia. She was seen by Neonatology during her stay.  She was observed for about a week, fetal heart rate monitoring remained reassuring, and she had no further bleeding, signs/symptoms of progressing preterm labor or other maternal-fetal concerns.  Her cervix was reevaluated by ultrasound on day of discharge, noted to have a 2.6 cm length.   She was deemed stable for discharge to home with close outpatient follow up and on limited activities for the rest of her pregnancy..  Discharge Exam: Temp:  [97.5 F (36.4 C)-98.5 F (36.9 C)] 97.9 F (36.6 C) (12/31 0754) Pulse Rate:  [86-104] 104 (12/31 0754) Resp:  [17-19] 18 (12/31 0754) BP: (110-125)/(58-75) 116/74 (12/31 0754) SpO2:  [97 %-100 %] 99 % (12/31 0754) Physical Examination: CONSTITUTIONAL: Well-developed,  well-nourished female in no acute distress.  HENT:  Normocephalic, atraumatic, External right and left ear normal. Oropharynx is clear and moist EYES: Conjunctivae and EOM are normal. Pupils are equal, round, and reactive to light. No scleral icterus.  NECK: Normal range of motion, supple, no masses SKIN: Skin is warm and dry. No rash noted. Not diaphoretic. No erythema. No pallor. [place]: Alert and oriented to person, place, and time. Normal reflexes, muscle tone coordination. No cranial nerve deficit noted. PSYCHIATRIC: Normal mood and affect. Normal behavior. Normal judgment and thought content. CARDIOVASCULAR: Normal heart rate noted, regular rhythm RESPIRATORY: Effort and breath sounds normal, no problems with respiration noted MUSCULOSKELETAL: Normal range of motion. No edema and no tenderness. 2+ distal pulses. ABDOMEN: Soft, nontender, nondistended, gravid. CERVIX:  Deferered  Fetal monitoring: FHR: 145 bpm, Variability: moderate, Accelerations: Present, Decelerations: Absent  Uterine activity: Rare contractions per hour  Significant Diagnostic Studies:  Results for orders placed or performed during the hospital encounter of 08/02/18 (from the past 168 hour(s))  Urinalysis, Routine w reflex microscopic   Collection Time: 08/02/18  5:05 PM  Result Value Ref Range   Color, Urine YELLOW YELLOW   APPearance CLEAR CLEAR   Specific Gravity, Urine 1.009 1.005 - 1.030   pH 6.0 5.0 - 8.0   Glucose, UA NEGATIVE NEGATIVE mg/dL   Hgb urine dipstick LARGE (A) NEGATIVE   Bilirubin Urine NEGATIVE NEGATIVE   Ketones, ur 5 (A) NEGATIVE mg/dL   Protein, ur NEGATIVE NEGATIVE mg/dL   Nitrite NEGATIVE NEGATIVE   Leukocytes, UA NEGATIVE NEGATIVE   RBC / HPF 0-5 0 - 5 RBC/hpf   WBC, UA 0-5 0 - 5 WBC/hpf   Bacteria, UA RARE (  A) NONE SEEN   Squamous Epithelial / LPF 0-5 0 - 5   Mucus PRESENT   CBC   Collection Time: 08/02/18  5:45 PM  Result Value Ref Range   WBC 12.1 (H) 4.0 - 10.5 K/uL    RBC 3.42 (L) 3.87 - 5.11 MIL/uL   Hemoglobin 11.2 (L) 12.0 - 15.0 g/dL   HCT 32.8 (L) 36.0 - 46.0 %   MCV 95.9 80.0 - 100.0 fL   MCH 32.7 26.0 - 34.0 pg   MCHC 34.1 30.0 - 36.0 g/dL   RDW 13.7 11.5 - 15.5 %   Platelets 292 150 - 400 K/uL   nRBC 0.0 0.0 - 0.2 %  Type and screen   Collection Time: 08/02/18  5:45 PM  Result Value Ref Range   ABO/RH(D) A POS    Antibody Screen NEG    Sample Expiration      08/05/2018 Performed at [facility], [address], [place], [place] [address]   ABO/Rh   Collection Time: 08/02/18  5:45 PM  Result Value Ref Range   ABO/RH(D)      A POS Performed at [facility], [address], [place], [place] [address]   CBC   Collection Time: 08/03/18  8:58 AM  Result Value Ref Range   WBC 11.9 (H) 4.0 - 10.5 K/uL   RBC 3.47 (L) 3.87 - 5.11 MIL/uL   Hemoglobin 11.2 (L) 12.0 - 15.0 g/dL   HCT 33.0 (L) 36.0 - 46.0 %   MCV 95.1 80.0 - 100.0 fL   MCH 32.3 26.0 - 34.0 pg   MCHC 33.9 30.0 - 36.0 g/dL   RDW 13.7 11.5 - 15.5 %   Platelets 285 150 - 400 K/uL   nRBC 0.0 0.0 - 0.2 %  Type and screen   Collection Time: 08/05/18 10:40 PM  Result Value Ref Range   ABO/RH(D) A POS    Antibody Screen NEG    Sample Expiration      08/08/2018 Performed at [facility], [address], [place], [place] [address]   Type and screen [place]   Collection Time: 08/07/18  5:16 AM  Result Value Ref Range   ABO/RH(D) A POS    Antibody Screen NEG    Sample Expiration      08/10/2018 Performed at [facility], [address], [place], [place] [address]   CBC   Collection Time: 08/07/18  5:18 AM  Result Value Ref Range   WBC 8.1 4.0 - 10.5 K/uL   RBC 3.22 (L) 3.87 - 5.11 MIL/uL   Hemoglobin 10.4 (L) 12.0 - 15.0 g/dL   HCT 31.1 (L) 36.0 - 46.0 %   MCV 96.6 80.0 - 100.0 fL   MCH 32.3 26.0 - 34.0 pg   MCHC 33.4 30.0 - 36.0 g/dL   RDW 13.6 11.5 - 15.5 %   Platelets 299 150 - 400 K/uL   nRBC 0.0 0.0 - 0.2 %  Creatinine,  serum   Collection Time: 08/07/18  5:18 AM  Result Value Ref Range   Creatinine, Ser 0.54 0.44 - 1.00 mg/dL   GFR calc non Af Amer >60 >60 mL/min   GFR calc Af Amer >60 >60 mL/min   [place] Mfm Ob Transvaginal  Result Date: 08/08/2018 ----------------------------------------------------------------------  OBSTETRICS REPORT                       (Signed Final 08/08/2018 08:42 am) ---------------------------------------------------------------------- Patient Info  ID #:  [id number]                          D.O.B.:  1985-09-08 (32 yrs)  Name:       [name]                  Visit Date: 08/08/2018 06:57 am              [place] ---------------------------------------------------------------------- Performed By  Performed By:     Novella Rob        Secondary Phy.:   [facility] for                                                             [place]  Attending:        Tama High MD        Address:          [place]  Referred By:      Bufford Lope.:    MAU NursingHarolyn Rutherford MD                                                             MAU/Triage  Ref. Address:     [address]       Location:         [facility]                    [place], [place] ----------------------------------------------------------------------  Orders   #  Description                          Code         Ordered By   1  [place] MFM OB TRANSVAGINAL               16109.6      [name]  ----------------------------------------------------------------------   #  Order #                    Accession #                 Episode #   1  [id number]                  [id number]                  [id number]   ---------------------------------------------------------------------- Indications   Uterine fibroids affecting pregnancy in        O34.12, D25.9   second trimester, antepartum   Hypertension - Chronic/Pre-existing            O10.019   Pregnancy resulting from assisted              O09.819   reproductive technology   Deep vein thrombosis (DVT) on Lovenox          O22.30   Fetal arrhythmia affecting pregnancy,          O36.8390   antepartum (PACs)   [redacted] weeks gestation of pregnancy                Z3A.26   Vaginal bleeding in pregnancy, second          O46.92   trimester   Placenta previa with hemorrhage, second        O44.12   trimester  ---------------------------------------------------------------------- Vital Signs  Weight (lb): 158                               Height:        5'5"  BMI:         26.29 ---------------------------------------------------------------------- Fetal Evaluation  Num Of Fetuses:         1  Fetal Heart Rate(bpm):  145  Cardiac Activity:       Observed  Presentation:           Cephalic  Placenta:               Posterior Previa  Amniotic Fluid  AFI FV:      Within normal limits                              Largest Pocket(cm)                              5.36  Comment:    ** Possible Hematoma visualized at internal cervical os              measuring 3.7cm ** ---------------------------------------------------------------------- OB History  Gravidity:    1 ---------------------------------------------------------------------- Gestational Age  Best:          26w 4d     Det. By:  D.O. Conception          EDD:   11/10/18                                      (  02/17/18) ---------------------------------------------------------------------- Cervix Uterus Adnexa  Cervix  Length:            2.6  cm.  Measured transvaginally. ---------------------------------------------------------------------- Impression  Patient with placenta previa was admitted 2 days ago. She is  here for evaluation of cervical  length that could not be clearly  assessed on her previous scan. Patient does not have  uterine contractions now.  Amniotic fluid is normal and good fetal activity is seen. On  transvaginal scan, the cervix is seen well and measures 2.6  cm. Placenta previa is seen. At the anterior edge of placenta,  hematoma (blood clot) is seen. ---------------------------------------------------------------------- Recommendations  Fetal growth assessment in 2 to 3 weeks. ----------------------------------------------------------------------                  Tama High, MD Electronically Signed Final Report   08/08/2018 08:42 am ----------------------------------------------------------------------  [place] Mfm Ob Transvaginal  Result Date: 08/02/2018 ----------------------------------------------------------------------  OBSTETRICS REPORT                       (Signed Final 08/02/2018 07:00 pm) ---------------------------------------------------------------------- Patient Info  ID #:       [id number]                          D.O.B.:  08/03/1986 (32 yrs)  Name:       [name]                  Visit Date: 08/02/2018 06:51 pm              [place] ---------------------------------------------------------------------- Performed By  Performed By:     Rodrigo Ran BS      Secondary Phy.:    [facility]                                                              [place]  Attending:        Tama High MD        Address:           [address]  Referred By:      Bufford Lope.:     MAU NursingHarolyn Rutherford MD                                                              MAU/Triage  Ref. Address:     [address]       Location:          [facility]                    [place], [place] ---------------------------------------------------------------------- Orders   #  Description                          Code         Ordered By   1  [place] MFM OB LIMITED                    [phone]     Manya Silvas   2  [place] MFM OB TRANSVAGINAL               66440.3      Manya Silvas  ----------------------------------------------------------------------   #  Order #                    Accession #                 Episode #   1  [id number]                  [id number]                  [id number]   2  [id number]                  [id number]                  [id number]  ---------------------------------------------------------------------- Indications   Uterine fibroids affecting pregnancy in        O34.12, D25.9   second trimester, antepartum   Hypertension - Chronic/Pre-existing            O10.019   Pregnancy resulting from assisted              O09.819   reproductive technology   Deep vein thrombosis (DVT) on Lovenox          O22.30   Encounter for other antenatal screening        Z36.2   follow-up   [redacted] weeks gestation of pregnancy                Z3A.25   Fetal arrhythmia affecting pregnancy,          O36.8390   antepartum (PACs)   Vaginal bleeding in pregnancy, second  O46.92   trimester   Placenta previa with hemorrhage, second        O44.12   trimester  ---------------------------------------------------------------------- Vital Signs                                                 Height:        5'5" ---------------------------------------------------------------------- Fetal Evaluation  Num Of Fetuses:          1  Fetal Heart Rate(bpm):   146  Cardiac Activity:        Observed  Presentation:            Transverse, head to maternal left  Placenta:                Posterior Previa  P. Cord Insertion:       Visualized  Amniotic Fluid  AFI FV:      Within normal limits                              Largest Pocket(cm)                               6.5 ---------------------------------------------------------------------- OB History  Gravidity:    1 ---------------------------------------------------------------------- Gestational Age  Best:          25w 5d     Det. By:  D.O. Conception          EDD:   11/10/18                                      (02/17/18) ---------------------------------------------------------------------- Cervix Uterus Adnexa  Cervix  Length:            1.4  cm.  Measured transvaginally. ---------------------------------------------------------------------- Impression  Patient with known placenta previa is being evaluated in the  MAU for c/o vaginal bleeding and abdominal cramping.  A limited ultrasound was performed. Amniotic fluid is normal  and good fetal activity is seen. Placenta previa is seen. On  transvaginal ultrasound, the cervix could not be clearly  measured, but appears short (1.4 cm). No cervical dilation is  seen.  Recommend management on clinical grounds including  admission, antenatal corticosteroids and tocolysis if  indicated. Delivery decision should be based on the severity  of bleeding.  Discussed with Manya Silvas, CNM. ----------------------------------------------------------------------                  Tama High, MD Electronically Signed Final Report   08/02/2018 07:00 pm ----------------------------------------------------------------------  - Future Appointments  Date Time Provider [place]  08/15/2018  8:15 AM Aletha Halim, MD [facility]  08/25/2018  3:30 PM [place] 5 [facility]  09/08/2018 12:15 PM Marcha Solders, MD [id number]    Discharge Condition: Stable  Discharge disposition: 01-Home or Self Care       Discharge Instructions    Discharge activity: Bedrest   Complete by:  As directed    Discharge diet:  No restrictions   Complete by:  As directed    Do not have sex or do anything that might make you have an orgasm    Complete by:  As directed    Fetal Kick  Count:  Lie on our left side for one hour after a meal, and count the number of times your baby kicks.  If it is less than 5 times, get up, move around and drink some juice.  Repeat the test 30 minutes later.  If it is still less than 5 kicks in an hour, notify your doctor.   Complete by:  As directed    No sexual activity restrictions   Complete by:  As directed    Notify physician for a general feeling that "something is not right"   Complete by:  As directed    Notify physician for increase or change in vaginal discharge   Complete by:  As directed    Notify physician for leaking of fluid   Complete by:  As directed    Notify physician for low, dull backache, unrelieved by heat or Tylenol   Complete by:  As directed    Notify physician for pelvic pressure   Complete by:  As directed    Notify physician for uterine contractions.  These may be painless and feel like the uterus is tightening or the baby is  "balling up"   Complete by:  As directed    Notify physician for vaginal bleeding   Complete by:  As directed    PRETERM LABOR:  Includes any of the follwing symptoms that occur between 20 - [redacted] weeks gestation.  If these symptoms are not stopped, preterm labor can result in preterm delivery, placing your baby at risk   Complete by:  As directed      Allergies as of 08/08/2018   No Known Allergies     Medication List    TAKE these medications   acetaminophen 500 MG tablet Commonly known as:  TYLENOL Take 1,000 mg by mouth every 6 (six) hours as needed for mild pain.   aspirin EC 81 MG tablet Take 1 tablet (81 mg total) by mouth daily. Take after 12 weeks for prevention of preeclampsia later in pregnancy   COMFORT FIT MATERNITY SUPP SM Misc 1 Units by Does not apply route daily as needed.   docusate sodium 100 MG capsule Commonly known as:  COLACE Take 1 capsule (100 mg total) by mouth 2 (two) times daily as needed for mild  constipation.   enoxaparin 40 MG/0.4ML injection Commonly known as:  LOVENOX Inject 0.4 mLs (40 mg total) into the skin daily.   famotidine 20 MG tablet Commonly known as:  PEPCID Take 1 tablet (20 mg total) by mouth 2 (two) times daily.   HYDROcodone-acetaminophen 5-325 MG tablet Commonly known as:  NORCO/VICODIN Take 1-2 tablets by mouth every 6 (six) hours as needed for severe pain.   NIFEdipine 30 MG 24 hr tablet Commonly known as:  ADALAT CC Take 1 tablet (30 mg total) by mouth 2 (two) times daily.   prenatal multivitamin Tabs tablet Take 1 tablet by mouth daily at 12 noon.   traMADol 50 MG tablet Commonly known as:  ULTRAM Take 1 tablet (50 mg total) by mouth every 6 (six) hours as needed.   Vitamin D 50 MCG (2000 UT) tablet Take 2,000 Units by mouth daily.        Signed: [name] M.D. 08/08/2018, 8:46 AM

## 2018-08-08 NOTE — Discharge Instructions (Signed)
Activity Restriction During Pregnancy Your health care provider may recommend specific activity restrictions during pregnancy for a variety of reasons. Activity restriction may require that you limit activities that require great effort, such as exercise, lifting, or sex. The type of activity restriction will vary for each person, depending on your risk or the problems you are having. Activity restriction may be recommended for a period of time until your baby is delivered. Why are activity restrictions recommended? Activity restriction may be recommended if:  Your placenta is partially or completely covering the opening of your cervix (placenta previa).  There is bleeding between the wall of the uterus and the amniotic sac in the first trimester of pregnancy (subchorionic hemorrhage).  You went into labor too early (preterm labor).  You have a history of miscarriage.  You have a condition that causes high blood pressure during pregnancy (preeclampsia or eclampsia).  You are pregnant with more than one baby.  Your baby is not growing well. What are the risks? The risks depend on your specific restriction. Strict bed rest has the most physical and emotional risks and is no longer routinely recommended. Risks of strict bed rest include:  Loss of muscle conditioning from not moving.  Blood clots.  Social isolation.  Depression.  Loss of income. Talk with your health care team about activity restriction to decide if it is best for you and your baby. Even if you are having problems during your pregnancy, you may be able to continue with normal levels of activity with careful monitoring by your health care team. Follow these instructions at home: If needed, based on your overall health and the health of your baby, your health care provider will decide which type of activity restriction is right for you. Activity restrictions may include:  Not lifting anything heavier than 10 pounds (4.5  kg).  Avoiding activities that take a lot of physical effort.  No lifting or straining.  Resting in a sitting position or lying down for periods of time during the day. Pelvic rest may be recommended along with activity restrictions. If pelvic rest is recommended, then:  Do not have sex, an orgasm, or use sexual stimulation.  Do not use tampons. Do not douche. Do not put anything into your vagina.  Do not lift anything that is heavier than 10 lb (4.5 kg).  Avoid activities that require a lot of effort.  Avoid any activity in which your pelvic muscles could become strained, such as squatting. Questions to ask your health care provider  Why is my activity being limited?  How will activity restrictions affect my body?  Why is rest helpful for me and my baby?  What activities can I do?  When can I return to normal activities? When should I seek immediate medical care? Seek immediate medical care if you have:  Vaginal bleeding.  Vaginal discharge.  Cramping pain in your lower abdomen.  Regular contractions.  A low, dull backache. Summary  Your health care provider may recommend specific activity restrictions during pregnancy for a variety of reasons.  Activity restriction may require that you limit activities such as exercise, lifting, sex, or any other activity that requires great effort.  Discuss the risks and benefits of activity restriction with your health care team to decide if it is best for you and your baby.  Contact your health care provider right away if you think you are having contractions, or if you notice vaginal bleeding, discharge, or cramping. This information is not  intended to replace advice given to you by your health care provider. Make sure you discuss any questions you have with your health care provider. Document Released: 11/20/2010 Document Revised: 11/15/2017 Document Reviewed: 11/15/2017 Elsevier Interactive Patient Education  2019 [employer]   Placenta Previa Placenta previa is a condition in which the placenta implants in the lower part of the uterus in pregnant women. The placenta either partially or completely covers the opening to the cervix. This is a problem because the baby must pass through the cervix during delivery. There are three types of placenta previa:  Marginal placenta previa. The placenta reaches within an inch (2.5 cm) of the cervical opening but does not cover it.  Partial placenta previa. The placenta covers part of the cervical opening.  Complete placenta previa. The placenta covers the entire cervical opening. If the previa is marginal or partial and it is diagnosed in the first half of pregnancy, the placenta may move into a normal position as the pregnancy progresses and may no longer cover the cervix. It is important to keep all prenatal visits with your health care provider so you can be more closely monitored. What are the causes? The cause of this condition is not known. What increases the risk? This condition is more likely to develop in women who:  Are carrying more than one baby (multiples).  Have an abnormally shaped uterus.  Have scars on the lining of the uterus.  Have had surgeries involving the uterus, such as a cesarean delivery.  Have delivered a baby before.  Have a history of placenta previa.  Have smoked or used cocaine during pregnancy.  Are age 20 or older during pregnancy. What are the signs or symptoms? The main symptom of this condition is sudden, painless vaginal bleeding during the second half of pregnancy. The amount of bleeding can be very light at first, and it usually stops on its own. Heavier bleeding episodes may also happen. Some women with placenta previa may have no bleeding at all. How is this diagnosed?  This condition is diagnosed: ? From an ultrasound. This test uses sound waves to find where the placenta is located before you have any bleeding  episodes. ? During a checkup after vaginal bleeding is noticed.  If you are diagnosed with a partial or complete previa, digital exams with fingers will generally be avoided. Your health care provider will still perform a speculum exam.  If you did not have an ultrasound during your pregnancy, placenta previa may not be diagnosed until bleeding occurs during labor. How is this treated? Treatment for this condition may include:  Decreased activity.  Bed rest at home or in the hospital.  Pelvic rest. Nothing is placed inside the vagina during pelvic rest. This means not having sex and not using tampons or douches.  A blood transfusion to replace blood that you have lost (maternal blood loss).  A cesarean delivery. This may be performed if: ? The bleeding is heavy and cannot be controlled. ? The placenta completely covers the cervix.  Medicines to stop premature labor or to help the baby's lungs to mature. This treatment may be used if you need delivery before your pregnancy is full-term. Your treatment will be decided based on:  How much you are bleeding, or whether the bleeding has stopped.  How far along you are in your pregnancy.  The condition of your baby.  The type of placenta previa that you have.  Follow these instructions at home:  Get plenty of rest and lessen activity as told by your health care provider.  Stay on bed rest for as long as told by your health care provider.  Do not have sex, use tampons, use a douche, or place anything inside of your vagina if your health care provider recommended pelvic rest.  Take over-the-counter and prescription medicines as told by your health care provider.  Keep all follow-up visits as told by your health care provider. This is important. Get help right away if:  You have vaginal bleeding, even if in small amounts and even if you have no pain.  You have cramping or regular contractions.  You have pain in your abdomen or  your lower back.  You have a feeling of increased pressure in your pelvis.  You have increased watery or bloody mucus from the vagina. This information is not intended to replace advice given to you by your health care provider. Make sure you discuss any questions you have with your health care provider. Document Released: 07/26/2005 Document Revised: 04/14/2016 Document Reviewed: 02/07/2016 Elsevier Interactive Patient Education  2019 [employer].

## 2018-08-08 NOTE — Progress Notes (Signed)
Patient discharged home with husband. Discharge teaching, home care, s/s PTL, prescriptions, and follow-up appts discussed. Pt verbalized understanding.

## 2018-08-10 ENCOUNTER — Encounter: Payer: Self-pay | Admitting: *Deleted

## 2018-08-11 ENCOUNTER — Encounter: Payer: Self-pay | Admitting: *Deleted

## 2018-08-15 ENCOUNTER — Ambulatory Visit (INDEPENDENT_AMBULATORY_CARE_PROVIDER_SITE_OTHER): Payer: 59 | Admitting: Obstetrics and Gynecology

## 2018-08-15 ENCOUNTER — Other Ambulatory Visit: Payer: Self-pay | Admitting: *Deleted

## 2018-08-15 ENCOUNTER — Encounter: Payer: Self-pay | Admitting: Obstetrics and Gynecology

## 2018-08-15 VITALS — BP 126/79 | HR 94 | Wt 155.0 lb

## 2018-08-15 DIAGNOSIS — O4402 Placenta previa specified as without hemorrhage, second trimester: Secondary | ICD-10-CM

## 2018-08-15 DIAGNOSIS — Z23 Encounter for immunization: Secondary | ICD-10-CM

## 2018-08-15 DIAGNOSIS — O10919 Unspecified pre-existing hypertension complicating pregnancy, unspecified trimester: Secondary | ICD-10-CM

## 2018-08-15 DIAGNOSIS — O0992 Supervision of high risk pregnancy, unspecified, second trimester: Secondary | ICD-10-CM

## 2018-08-15 DIAGNOSIS — O368390 Maternal care for abnormalities of the fetal heart rate or rhythm, unspecified trimester, not applicable or unspecified: Secondary | ICD-10-CM

## 2018-08-15 DIAGNOSIS — O4690 Antepartum hemorrhage, unspecified, unspecified trimester: Secondary | ICD-10-CM | POA: Insufficient documentation

## 2018-08-15 DIAGNOSIS — O10912 Unspecified pre-existing hypertension complicating pregnancy, second trimester: Secondary | ICD-10-CM

## 2018-08-15 DIAGNOSIS — O09812 Supervision of pregnancy resulting from assisted reproductive technology, second trimester: Secondary | ICD-10-CM

## 2018-08-15 DIAGNOSIS — O0990 Supervision of high risk pregnancy, unspecified, unspecified trimester: Secondary | ICD-10-CM | POA: Diagnosis not present

## 2018-08-15 DIAGNOSIS — O09819 Supervision of pregnancy resulting from assisted reproductive technology, unspecified trimester: Secondary | ICD-10-CM

## 2018-08-15 DIAGNOSIS — E049 Nontoxic goiter, unspecified: Secondary | ICD-10-CM

## 2018-08-15 DIAGNOSIS — Z3A27 27 weeks gestation of pregnancy: Secondary | ICD-10-CM

## 2018-08-15 DIAGNOSIS — O4400 Placenta previa specified as without hemorrhage, unspecified trimester: Secondary | ICD-10-CM

## 2018-08-15 DIAGNOSIS — O368320 Maternal care for abnormalities of the fetal heart rate or rhythm, second trimester, not applicable or unspecified: Secondary | ICD-10-CM

## 2018-08-15 DIAGNOSIS — O09813 Supervision of pregnancy resulting from assisted reproductive technology, third trimester: Secondary | ICD-10-CM

## 2018-08-15 DIAGNOSIS — Z86711 Personal history of pulmonary embolism: Secondary | ICD-10-CM

## 2018-08-15 DIAGNOSIS — O4692 Antepartum hemorrhage, unspecified, second trimester: Secondary | ICD-10-CM

## 2018-08-15 NOTE — Progress Notes (Signed)
Prenatal Visit Note Date: 08/15/2018 Clinic: [facility]  Subjective:  Melissa Ruiz is a 33 y.o. G1P0000 at [redacted]w[redacted]d being seen today for ongoing prenatal care.  She is currently monitored for the following issues for this high-risk pregnancy and has Essential hypertension; Gastroesophageal reflux disease; Hyperlipidemia; Iron deficiency anemia due to chronic blood loss; History of pulmonary embolus (PE); History of DVT (deep vein thrombosis); Vitamin D deficiency; Goiter; Supervision of high risk pregnancy, antepartum; Pregnancy resulting from in vitro fertilization, antepartum; Chronic hypertension during pregnancy, antepartum; Placenta previa antepartum; Fetal arrhythmia (frequent PACs) affecting management of mother; and Vaginal bleeding in pregnancy on their problem list.  Patient reports occasional old brown spotting.   Contractions: Irritability. Vag. Bleeding: Scant.  Movement: Present. Denies leaking of fluid.   The following portions of the patient's history were reviewed and updated as appropriate: allergies, current medications, past family history, past medical history, past social history, past surgical history and problem list. Problem list updated.  Objective:   Vitals:   08/15/18 0838  BP: 126/79  Pulse: 94  Weight: 155 lb (70.3 kg)    Fetal Status: Fetal Heart Rate (bpm): 146   Movement: Present     General:  Alert, oriented and cooperative. Patient is in no acute distress.  Skin: Skin is warm and dry. No rash noted.   Cardiovascular: Normal heart rate noted  Respiratory: Normal respiratory effort, no problems with respiration noted  Abdomen: Soft, gravid, appropriate for gestational age. Pain/Pressure: Present     Pelvic:  Cervical exam deferred        Extremities: Normal range of motion.  Edema: None  Mental Status: Normal mood and affect. Normal behavior. Normal judgment and thought content.   Urinalysis:      Assessment and Plan:   Pregnancy: G1P0000 at [redacted]w[redacted]d  1. Supervision of high risk pregnancy, antepartum Routine care. - HIV Antibody (routine testing w rflx) - Glucose Tolerance, 2 Hours w/1 Hour - T4, free - TSH - RPR - CBC  2. Chronic hypertension during pregnancy, antepartum Doing well on just low dose ASA  3. Fetal arrhythmia (frequent PACs) affecting management of mother F/u rpt fetal echo this month qwk FHR checks  4. Goiter Surveillance labs today  5. Vaginal bleeding in pregnancy Unchanged from d/c. ED precuations given  6. History of pulmonary embolus (PE) Continue ppx lovenox  7. Pregnancy resulting from in vitro fertilization, antepartum See above  8. Placenta previa antepartum F/u rpt u/s later this month   Preterm labor symptoms and general obstetric precautions including but not limited to vaginal bleeding, contractions, leaking of fluid and fetal movement were reviewed in detail with the patient. Please refer to After Visit Summary for other counseling recommendations.  Return in about 1 week (around 08/22/2018) for rn visit FHR doppler test x 15min.   Aletha Halim, MD

## 2018-08-16 LAB — GLUCOSE TOLERANCE, 2 HOURS W/ 1HR
Glucose, 1 hour: 132 mg/dL (ref 65–179)
Glucose, 2 hour: 121 mg/dL (ref 65–152)
Glucose, Fasting: 68 mg/dL (ref 65–91)

## 2018-08-16 LAB — HIV ANTIBODY (ROUTINE TESTING W REFLEX): HIV Screen 4th Generation wRfx: NONREACTIVE

## 2018-08-16 LAB — CBC
HEMATOCRIT: 33.2 % — AB (ref 34.0–46.6)
HEMOGLOBIN: 11.3 g/dL (ref 11.1–15.9)
MCH: 32.4 pg (ref 26.6–33.0)
MCHC: 34 g/dL (ref 31.5–35.7)
MCV: 95 fL (ref 79–97)
Platelets: 342 10*3/uL (ref 150–450)
RBC: 3.49 x10E6/uL — ABNORMAL LOW (ref 3.77–5.28)
RDW: 13.6 % (ref 11.7–15.4)
WBC: 9 10*3/uL (ref 3.4–10.8)

## 2018-08-16 LAB — RPR: RPR Ser Ql: NONREACTIVE

## 2018-08-16 LAB — T4, FREE: Free T4: 1.13 ng/dL (ref 0.82–1.77)

## 2018-08-16 LAB — TSH: TSH: 4.46 u[IU]/mL (ref 0.450–4.500)

## 2018-08-22 ENCOUNTER — Ambulatory Visit: Payer: 59

## 2018-08-25 ENCOUNTER — Encounter (HOSPITAL_COMMUNITY): Payer: Self-pay

## 2018-08-25 ENCOUNTER — Ambulatory Visit (HOSPITAL_BASED_OUTPATIENT_CLINIC_OR_DEPARTMENT_OTHER)
Admission: RE | Admit: 2018-08-25 | Discharge: 2018-08-25 | Disposition: A | Discharge status: Home / Self Care | Payer: 59 | Source: Ambulatory Visit | Attending: Family Medicine | Admitting: Family Medicine

## 2018-08-25 DIAGNOSIS — Z3A29 29 weeks gestation of pregnancy: Secondary | ICD-10-CM

## 2018-08-25 DIAGNOSIS — O4413 Placenta previa with hemorrhage, third trimester: Secondary | ICD-10-CM

## 2018-08-25 DIAGNOSIS — O10013 Pre-existing essential hypertension complicating pregnancy, third trimester: Secondary | ICD-10-CM | POA: Diagnosis not present

## 2018-08-25 DIAGNOSIS — O368330 Maternal care for abnormalities of the fetal heart rate or rhythm, third trimester, not applicable or unspecified: Secondary | ICD-10-CM

## 2018-08-25 DIAGNOSIS — O3412 Maternal care for benign tumor of corpus uteri, second trimester: Secondary | ICD-10-CM | POA: Diagnosis not present

## 2018-08-25 DIAGNOSIS — O4400 Placenta previa specified as without hemorrhage, unspecified trimester: Secondary | ICD-10-CM | POA: Insufficient documentation

## 2018-08-25 DIAGNOSIS — O2233 Deep phlebothrombosis in pregnancy, third trimester: Secondary | ICD-10-CM

## 2018-08-25 DIAGNOSIS — O368390 Maternal care for abnormalities of the fetal heart rate or rhythm, unspecified trimester, not applicable or unspecified: Secondary | ICD-10-CM

## 2018-08-25 DIAGNOSIS — O09813 Supervision of pregnancy resulting from assisted reproductive technology, third trimester: Secondary | ICD-10-CM | POA: Diagnosis not present

## 2018-08-25 DIAGNOSIS — Z362 Encounter for other antenatal screening follow-up: Secondary | ICD-10-CM | POA: Insufficient documentation

## 2018-08-26 ENCOUNTER — Inpatient Hospital Stay (HOSPITAL_COMMUNITY)
Admission: AD | Admit: 2018-08-26 | Discharge: 2018-08-30 | DRG: 787 | Disposition: A | Discharge status: Home / Self Care | Payer: 59 | Attending: Obstetrics and Gynecology | Admitting: Obstetrics and Gynecology

## 2018-08-26 DIAGNOSIS — O9081 Anemia of the puerperium: Secondary | ICD-10-CM | POA: Diagnosis not present

## 2018-08-26 DIAGNOSIS — Z86718 Personal history of other venous thrombosis and embolism: Secondary | ICD-10-CM

## 2018-08-26 DIAGNOSIS — Z86711 Personal history of pulmonary embolism: Secondary | ICD-10-CM

## 2018-08-26 DIAGNOSIS — O1002 Pre-existing essential hypertension complicating childbirth: Secondary | ICD-10-CM | POA: Diagnosis not present

## 2018-08-26 DIAGNOSIS — O368390 Maternal care for abnormalities of the fetal heart rate or rhythm, unspecified trimester, not applicable or unspecified: Secondary | ICD-10-CM

## 2018-08-26 DIAGNOSIS — D62 Acute posthemorrhagic anemia: Secondary | ICD-10-CM | POA: Diagnosis not present

## 2018-08-26 DIAGNOSIS — O4413 Placenta previa with hemorrhage, third trimester: Principal | ICD-10-CM | POA: Diagnosis present

## 2018-08-26 DIAGNOSIS — O0990 Supervision of high risk pregnancy, unspecified, unspecified trimester: Secondary | ICD-10-CM

## 2018-08-26 DIAGNOSIS — O4693 Antepartum hemorrhage, unspecified, third trimester: Secondary | ICD-10-CM | POA: Diagnosis present

## 2018-08-26 DIAGNOSIS — Z3A29 29 weeks gestation of pregnancy: Secondary | ICD-10-CM | POA: Diagnosis not present

## 2018-08-26 DIAGNOSIS — O4400 Placenta previa specified as without hemorrhage, unspecified trimester: Secondary | ICD-10-CM

## 2018-08-27 ENCOUNTER — Encounter (HOSPITAL_COMMUNITY): Payer: Self-pay | Admitting: *Deleted

## 2018-08-27 ENCOUNTER — Inpatient Hospital Stay (HOSPITAL_COMMUNITY): Payer: 59 | Admitting: Anesthesiology

## 2018-08-27 ENCOUNTER — Other Ambulatory Visit: Payer: Self-pay

## 2018-08-27 ENCOUNTER — Encounter (HOSPITAL_COMMUNITY)
Admission: AD | Disposition: A | Discharge status: Home / Self Care | Payer: Self-pay | Source: Home / Self Care | Attending: Obstetrics and Gynecology

## 2018-08-27 DIAGNOSIS — O4693 Antepartum hemorrhage, unspecified, third trimester: Secondary | ICD-10-CM | POA: Diagnosis present

## 2018-08-27 DIAGNOSIS — O4413 Placenta previa with hemorrhage, third trimester: Secondary | ICD-10-CM

## 2018-08-27 DIAGNOSIS — O9081 Anemia of the puerperium: Secondary | ICD-10-CM | POA: Diagnosis not present

## 2018-08-27 DIAGNOSIS — O4403 Placenta previa specified as without hemorrhage, third trimester: Secondary | ICD-10-CM | POA: Diagnosis not present

## 2018-08-27 DIAGNOSIS — Z86711 Personal history of pulmonary embolism: Secondary | ICD-10-CM | POA: Diagnosis not present

## 2018-08-27 DIAGNOSIS — D62 Acute posthemorrhagic anemia: Secondary | ICD-10-CM | POA: Diagnosis not present

## 2018-08-27 DIAGNOSIS — O1002 Pre-existing essential hypertension complicating childbirth: Secondary | ICD-10-CM | POA: Diagnosis present

## 2018-08-27 DIAGNOSIS — Z86718 Personal history of other venous thrombosis and embolism: Secondary | ICD-10-CM | POA: Diagnosis not present

## 2018-08-27 DIAGNOSIS — Z3A29 29 weeks gestation of pregnancy: Secondary | ICD-10-CM

## 2018-08-27 LAB — RPR: RPR Ser Ql: NONREACTIVE

## 2018-08-27 LAB — CBC
HCT: 32.8 % — ABNORMAL LOW (ref 36.0–46.0)
Hemoglobin: 11.3 g/dL — ABNORMAL LOW (ref 12.0–15.0)
MCH: 32.6 pg (ref 26.0–34.0)
MCHC: 34.5 g/dL (ref 30.0–36.0)
MCV: 94.5 fL (ref 80.0–100.0)
Platelets: 264 10*3/uL (ref 150–400)
RBC: 3.47 MIL/uL — ABNORMAL LOW (ref 3.87–5.11)
RDW: 13.4 % (ref 11.5–15.5)
WBC: 6.6 10*3/uL (ref 4.0–10.5)

## 2018-08-27 LAB — PREPARE RBC (CROSSMATCH)

## 2018-08-27 SURGERY — Surgical Case
Anesthesia: General | Site: Abdomen | Wound class: Clean Contaminated

## 2018-08-27 MED ORDER — FENTANYL CITRATE (PF) 250 MCG/5ML IJ SOLN
INTRAMUSCULAR | Status: AC
  Filled 2018-08-27: qty 5

## 2018-08-27 MED ORDER — SODIUM CHLORIDE 0.9 % IR SOLN
Status: DC | PRN
  Administered 2018-08-27: 1000 mL

## 2018-08-27 MED ORDER — CEFAZOLIN SODIUM-DEXTROSE 2-4 GM/100ML-% IV SOLN
INTRAVENOUS | Status: AC
  Filled 2018-08-27: qty 100

## 2018-08-27 MED ORDER — TETANUS-DIPHTH-ACELL PERTUSSIS 5-2.5-18.5 LF-MCG/0.5 IM SUSP: 0.5000 mL | Freq: Once | INTRAMUSCULAR | Status: DC

## 2018-08-27 MED ORDER — PROMETHAZINE HCL 25 MG/ML IJ SOLN: 6.2500 mg | INTRAMUSCULAR | Status: DC | PRN

## 2018-08-27 MED ORDER — HYDROMORPHONE 1 MG/ML IV SOLN
INTRAVENOUS | Status: DC
  Administered 2018-08-27: 30 mg via INTRAVENOUS
  Administered 2018-08-28: 1 mg via INTRAVENOUS
  Administered 2018-08-28: 1.9 mg via INTRAVENOUS
  Filled 2018-08-27: qty 30

## 2018-08-27 MED ORDER — SIMETHICONE 80 MG PO CHEW
80.0000 mg | CHEWABLE_TABLET | ORAL | Status: DC
  Administered 2018-08-28 – 2018-08-29 (×3): 80 mg via ORAL
  Filled 2018-08-27 (×3): qty 1

## 2018-08-27 MED ORDER — NALOXONE HCL 0.4 MG/ML IJ SOLN: 0.4000 mg | INTRAMUSCULAR | Status: DC | PRN

## 2018-08-27 MED ORDER — NIFEDIPINE ER OSMOTIC RELEASE 30 MG PO TB24
30.0000 mg | ORAL_TABLET | Freq: Two times a day (BID) | ORAL | Status: DC
  Administered 2018-08-27: 30 mg via ORAL
  Filled 2018-08-27: qty 1

## 2018-08-27 MED ORDER — SODIUM CHLORIDE 0.9% IV SOLUTION: Freq: Once | INTRAVENOUS | Status: DC

## 2018-08-27 MED ORDER — ENOXAPARIN SODIUM 40 MG/0.4ML ~~LOC~~ SOLN: 40.0000 mg | SUBCUTANEOUS | Status: DC

## 2018-08-27 MED ORDER — LACTATED RINGERS IV SOLN
INTRAVENOUS | Status: DC | PRN
  Administered 2018-08-27 (×3): via INTRAVENOUS

## 2018-08-27 MED ORDER — ACETAMINOPHEN 325 MG PO TABS
650.0000 mg | ORAL_TABLET | ORAL | Status: DC | PRN
  Administered 2018-08-28 – 2018-08-30 (×5): 650 mg via ORAL
  Filled 2018-08-27 (×5): qty 2

## 2018-08-27 MED ORDER — BUPIVACAINE HCL (PF) 0.5 % IJ SOLN
INTRAMUSCULAR | Status: AC
  Filled 2018-08-27: qty 30

## 2018-08-27 MED ORDER — FENTANYL CITRATE (PF) 100 MCG/2ML IJ SOLN
INTRAMUSCULAR | Status: AC
  Filled 2018-08-27: qty 2

## 2018-08-27 MED ORDER — PHENYLEPHRINE 8 MG IN D5W 100 ML (0.08MG/ML) PREMIX OPTIME
INJECTION | INTRAVENOUS | Status: AC
  Filled 2018-08-27: qty 100

## 2018-08-27 MED ORDER — DOCUSATE SODIUM 100 MG PO CAPS
100.0000 mg | ORAL_CAPSULE | Freq: Two times a day (BID) | ORAL | Status: DC | PRN
  Filled 2018-08-27: qty 1

## 2018-08-27 MED ORDER — OXYTOCIN 10 UNIT/ML IJ SOLN
INTRAMUSCULAR | Status: AC
  Filled 2018-08-27: qty 4

## 2018-08-27 MED ORDER — ACETAMINOPHEN 10 MG/ML IV SOLN
INTRAVENOUS | Status: AC
  Filled 2018-08-27: qty 100

## 2018-08-27 MED ORDER — FENTANYL CITRATE (PF) 100 MCG/2ML IJ SOLN
INTRAMUSCULAR | Status: DC | PRN
  Administered 2018-08-27: 50 ug via INTRAVENOUS
  Administered 2018-08-27: 100 ug via INTRAVENOUS
  Administered 2018-08-27: 50 ug via INTRAVENOUS
  Administered 2018-08-27: 250 ug via INTRAVENOUS

## 2018-08-27 MED ORDER — CALCIUM CARBONATE ANTACID 500 MG PO CHEW: 2.0000 | CHEWABLE_TABLET | ORAL | Status: DC | PRN

## 2018-08-27 MED ORDER — SODIUM CHLORIDE 0.9% FLUSH: 9.0000 mL | INTRAVENOUS | Status: DC | PRN

## 2018-08-27 MED ORDER — PROPOFOL 10 MG/ML IV BOLUS
INTRAVENOUS | Status: DC | PRN
  Administered 2018-08-27: 200 mg via INTRAVENOUS

## 2018-08-27 MED ORDER — ONDANSETRON HCL 4 MG/2ML IJ SOLN: 4.0000 mg | Freq: Four times a day (QID) | INTRAMUSCULAR | Status: DC | PRN

## 2018-08-27 MED ORDER — SUCCINYLCHOLINE CHLORIDE 20 MG/ML IJ SOLN
INTRAMUSCULAR | Status: DC | PRN
  Administered 2018-08-27: 120 mg via INTRAVENOUS

## 2018-08-27 MED ORDER — PROPOFOL 10 MG/ML IV BOLUS
INTRAVENOUS | Status: AC
  Filled 2018-08-27: qty 20

## 2018-08-27 MED ORDER — MIDAZOLAM HCL 2 MG/2ML IJ SOLN
INTRAMUSCULAR | Status: AC
  Filled 2018-08-27: qty 2

## 2018-08-27 MED ORDER — DOCUSATE SODIUM 100 MG PO CAPS
100.0000 mg | ORAL_CAPSULE | Freq: Every day | ORAL | Status: DC
  Administered 2018-08-27 – 2018-08-30 (×4): 100 mg via ORAL
  Filled 2018-08-27 (×5): qty 1

## 2018-08-27 MED ORDER — ONDANSETRON HCL 4 MG/2ML IJ SOLN
INTRAMUSCULAR | Status: AC
  Filled 2018-08-27: qty 2

## 2018-08-27 MED ORDER — FAMOTIDINE 20 MG PO TABS
20.0000 mg | ORAL_TABLET | Freq: Two times a day (BID) | ORAL | Status: DC
  Administered 2018-08-27 – 2018-08-30 (×6): 20 mg via ORAL
  Filled 2018-08-27 (×7): qty 1

## 2018-08-27 MED ORDER — LACTATED RINGERS IV SOLN
INTRAVENOUS | Status: DC
  Administered 2018-08-27 (×2): via INTRAVENOUS

## 2018-08-27 MED ORDER — MENTHOL 3 MG MT LOZG: 1.0000 | LOZENGE | OROMUCOSAL | Status: DC | PRN

## 2018-08-27 MED ORDER — DIPHENHYDRAMINE HCL 12.5 MG/5ML PO ELIX: 12.5000 mg | ORAL_SOLUTION | Freq: Four times a day (QID) | ORAL | Status: DC | PRN

## 2018-08-27 MED ORDER — ASPIRIN EC 81 MG PO TBEC
81.0000 mg | DELAYED_RELEASE_TABLET | Freq: Every day | ORAL | Status: DC
  Administered 2018-08-27: 81 mg via ORAL
  Filled 2018-08-27 (×2): qty 1

## 2018-08-27 MED ORDER — SUCCINYLCHOLINE CHLORIDE 200 MG/10ML IV SOSY
PREFILLED_SYRINGE | INTRAVENOUS | Status: AC
  Filled 2018-08-27: qty 10

## 2018-08-27 MED ORDER — LIDOCAINE HCL (CARDIAC) PF 100 MG/5ML IV SOSY
PREFILLED_SYRINGE | INTRAVENOUS | Status: DC | PRN
  Administered 2018-08-27: 100 mg via INTRATRACHEAL

## 2018-08-27 MED ORDER — ACETAMINOPHEN 10 MG/ML IV SOLN
INTRAVENOUS | Status: DC | PRN
  Administered 2018-08-27: 1000 mg via INTRAVENOUS

## 2018-08-27 MED ORDER — DIBUCAINE 1 % RE OINT: 1.0000 "application " | TOPICAL_OINTMENT | RECTAL | Status: DC | PRN

## 2018-08-27 MED ORDER — SIMETHICONE 80 MG PO CHEW
80.0000 mg | CHEWABLE_TABLET | Freq: Three times a day (TID) | ORAL | Status: DC
  Administered 2018-08-27 – 2018-08-30 (×6): 80 mg via ORAL
  Filled 2018-08-27 (×6): qty 1

## 2018-08-27 MED ORDER — HYDROMORPHONE HCL 1 MG/ML IJ SOLN
INTRAMUSCULAR | Status: AC
  Filled 2018-08-27: qty 1

## 2018-08-27 MED ORDER — CEFAZOLIN SODIUM-DEXTROSE 2-3 GM-%(50ML) IV SOLR
INTRAVENOUS | Status: DC | PRN
  Administered 2018-08-27: 2 g via INTRAVENOUS

## 2018-08-27 MED ORDER — DEXAMETHASONE SODIUM PHOSPHATE 4 MG/ML IJ SOLN
INTRAMUSCULAR | Status: DC | PRN
  Administered 2018-08-27: 4 mg via INTRAVENOUS

## 2018-08-27 MED ORDER — SOD CITRATE-CITRIC ACID 500-334 MG/5ML PO SOLN
30.0000 mL | Freq: Once | ORAL | Status: AC
  Administered 2018-08-27: 30 mL via ORAL

## 2018-08-27 MED ORDER — OXYTOCIN 40 UNITS IN NORMAL SALINE INFUSION - SIMPLE MED: 2.5000 [IU]/h | INTRAVENOUS | Status: AC

## 2018-08-27 MED ORDER — MORPHINE SULFATE (PF) 0.5 MG/ML IJ SOLN
INTRAMUSCULAR | Status: AC
  Filled 2018-08-27: qty 10

## 2018-08-27 MED ORDER — WITCH HAZEL-GLYCERIN EX PADS: 1.0000 "application " | MEDICATED_PAD | CUTANEOUS | Status: DC | PRN

## 2018-08-27 MED ORDER — PRENATAL MULTIVITAMIN CH
1.0000 | ORAL_TABLET | Freq: Every day | ORAL | Status: DC
  Administered 2018-08-27: 1 via ORAL
  Filled 2018-08-27 (×2): qty 1

## 2018-08-27 MED ORDER — SODIUM CHLORIDE 0.9% FLUSH
INTRAVENOUS | Status: AC
  Filled 2018-08-27: qty 3

## 2018-08-27 MED ORDER — STERILE WATER FOR IRRIGATION IR SOLN
Status: DC | PRN
  Administered 2018-08-27: 1000 mL

## 2018-08-27 MED ORDER — DIPHENHYDRAMINE HCL 50 MG/ML IJ SOLN: 12.5000 mg | Freq: Four times a day (QID) | INTRAMUSCULAR | Status: DC | PRN

## 2018-08-27 MED ORDER — SIMETHICONE 80 MG PO CHEW
80.0000 mg | CHEWABLE_TABLET | ORAL | Status: DC | PRN
  Administered 2018-08-28: 80 mg via ORAL
  Filled 2018-08-27 (×2): qty 1

## 2018-08-27 MED ORDER — HYDROMORPHONE HCL 1 MG/ML IJ SOLN
0.2500 mg | INTRAMUSCULAR | Status: DC | PRN
  Administered 2018-08-27: 0.5 mg via INTRAVENOUS

## 2018-08-27 MED ORDER — ACETAMINOPHEN 500 MG PO TABS: 1000.0000 mg | ORAL_TABLET | Freq: Four times a day (QID) | ORAL | Status: DC | PRN

## 2018-08-27 MED ORDER — MEPERIDINE HCL 25 MG/ML IJ SOLN: 6.2500 mg | INTRAMUSCULAR | Status: DC | PRN

## 2018-08-27 MED ORDER — ZOLPIDEM TARTRATE 5 MG PO TABS: 5.0000 mg | ORAL_TABLET | Freq: Every evening | ORAL | Status: DC | PRN

## 2018-08-27 MED ORDER — SOD CITRATE-CITRIC ACID 500-334 MG/5ML PO SOLN
ORAL | Status: AC
  Filled 2018-08-27: qty 15

## 2018-08-27 MED ORDER — HYDROMORPHONE HCL 1 MG/ML IJ SOLN
INTRAMUSCULAR | Status: AC
  Administered 2018-08-27: 0.5 mg via INTRAVENOUS
  Filled 2018-08-27: qty 0.5

## 2018-08-27 MED ORDER — BUPIVACAINE HCL (PF) 0.5 % IJ SOLN
INTRAMUSCULAR | Status: DC | PRN
  Administered 2018-08-27: 30 mL

## 2018-08-27 MED ORDER — HYDROMORPHONE HCL 1 MG/ML IJ SOLN
INTRAMUSCULAR | Status: DC | PRN
  Administered 2018-08-27 (×2): 1 mg via INTRAVENOUS

## 2018-08-27 MED ORDER — MIDAZOLAM HCL 2 MG/2ML IJ SOLN
INTRAMUSCULAR | Status: DC | PRN
  Administered 2018-08-27: 2 mg via INTRAVENOUS

## 2018-08-27 MED ORDER — SENNOSIDES-DOCUSATE SODIUM 8.6-50 MG PO TABS
2.0000 | ORAL_TABLET | ORAL | Status: DC
  Administered 2018-08-28 – 2018-08-29 (×3): 2 via ORAL
  Filled 2018-08-27 (×3): qty 2

## 2018-08-27 MED ORDER — DIPHENHYDRAMINE HCL 25 MG PO CAPS
25.0000 mg | ORAL_CAPSULE | Freq: Four times a day (QID) | ORAL | Status: DC | PRN
  Filled 2018-08-27: qty 1

## 2018-08-27 MED ORDER — ENOXAPARIN SODIUM 40 MG/0.4ML ~~LOC~~ SOLN
40.0000 mg | SUBCUTANEOUS | Status: DC
  Administered 2018-08-27 – 2018-08-29 (×3): 40 mg via SUBCUTANEOUS
  Filled 2018-08-27 (×4): qty 0.4

## 2018-08-27 MED ORDER — OXYTOCIN 10 UNIT/ML IJ SOLN
INTRAVENOUS | Status: DC | PRN
  Administered 2018-08-27: 40 [IU] via INTRAVENOUS

## 2018-08-27 MED ORDER — PRENATAL MULTIVITAMIN CH
1.0000 | ORAL_TABLET | Freq: Every day | ORAL | Status: DC
  Filled 2018-08-27 (×2): qty 1

## 2018-08-27 MED ORDER — COCONUT OIL OIL: 1.0000 "application " | TOPICAL_OIL | Status: DC | PRN

## 2018-08-27 MED ORDER — LACTATED RINGERS IV SOLN
INTRAVENOUS | Status: DC
  Administered 2018-08-28: 06:00:00 via INTRAVENOUS

## 2018-08-27 MED ORDER — OXYCODONE HCL 5 MG PO TABS: 5.0000 mg | ORAL_TABLET | Freq: Once | ORAL | Status: DC | PRN

## 2018-08-27 MED ORDER — ONDANSETRON HCL 4 MG/2ML IJ SOLN
INTRAMUSCULAR | Status: DC | PRN
  Administered 2018-08-27: 4 mg via INTRAVENOUS

## 2018-08-27 MED ORDER — KETOROLAC TROMETHAMINE 30 MG/ML IJ SOLN: 30.0000 mg | Freq: Once | INTRAMUSCULAR | Status: DC | PRN

## 2018-08-27 MED ORDER — OXYCODONE HCL 5 MG/5ML PO SOLN: 5.0000 mg | Freq: Once | ORAL | Status: DC | PRN

## 2018-08-27 SURGICAL SUPPLY — 30 items
BARRIER ADHS 3X4 INTERCEED (GAUZE/BANDAGES/DRESSINGS) IMPLANT
CHLORAPREP W/TINT 26ML (MISCELLANEOUS) ×2 IMPLANT
CLAMP CORD UMBIL (MISCELLANEOUS) IMPLANT
CLOTH BEACON ORANGE TIMEOUT ST (SAFETY) ×2 IMPLANT
DRSG OPSITE POSTOP 4X10 (GAUZE/BANDAGES/DRESSINGS) ×2 IMPLANT
ELECT REM PT RETURN 9FT ADLT (ELECTROSURGICAL) ×2
ELECTRODE REM PT RTRN 9FT ADLT (ELECTROSURGICAL) ×1 IMPLANT
EXTRACTOR VACUUM KIWI (MISCELLANEOUS) IMPLANT
GLOVE BIO SURGEON STRL SZ 6.5 (GLOVE) ×2 IMPLANT
GLOVE BIOGEL PI IND STRL 7.0 (GLOVE) ×2 IMPLANT
GLOVE BIOGEL PI INDICATOR 7.0 (GLOVE) ×2
GOWN STRL REUS W/TWL LRG LVL3 (GOWN DISPOSABLE) ×4 IMPLANT
KIT ABG SYR 3ML LUER SLIP (SYRINGE) IMPLANT
NEEDLE HYPO 22GX1.5 SAFETY (NEEDLE) IMPLANT
NEEDLE HYPO 25X5/8 SAFETYGLIDE (NEEDLE) IMPLANT
NS IRRIG 1000ML POUR BTL (IV SOLUTION) ×2 IMPLANT
PACK C SECTION WH (CUSTOM PROCEDURE TRAY) ×2 IMPLANT
PAD ABD 8X10 STRL (GAUZE/BANDAGES/DRESSINGS) ×4 IMPLANT
PAD OB MATERNITY 4.3X12.25 (PERSONAL CARE ITEMS) ×2 IMPLANT
PENCIL SMOKE EVAC W/HOLSTER (ELECTROSURGICAL) ×2 IMPLANT
RETRACTOR WND ALEXIS 25 LRG (MISCELLANEOUS) IMPLANT
RTRCTR WOUND ALEXIS 25CM LRG (MISCELLANEOUS)
SPONGE LAP 18X18 RF (DISPOSABLE) ×6 IMPLANT
SUT VIC AB 0 CT1 36 (SUTURE) ×12 IMPLANT
SUT VIC AB 2-0 CT1 27 (SUTURE) ×1
SUT VIC AB 2-0 CT1 TAPERPNT 27 (SUTURE) ×1 IMPLANT
SUT VIC AB 4-0 PS2 27 (SUTURE) ×2 IMPLANT
SYR CONTROL 10ML LL (SYRINGE) IMPLANT
TOWEL OR 17X24 6PK STRL BLUE (TOWEL DISPOSABLE) ×2 IMPLANT
TRAY FOLEY W/BAG SLVR 14FR LF (SET/KITS/TRAYS/PACK) IMPLANT

## 2018-08-27 NOTE — Transfer of Care (Signed)
Immediate Anesthesia Transfer of Care Note  Patient: Melissa Ruiz  Procedure(s) Performed: CESAREAN SECTION (N/A Abdomen)  Patient Location: PACU  Anesthesia Type:General  Level of Consciousness: awake, alert  and oriented  Airway & Oxygen Therapy: Patient Spontanous Breathing and Patient connected to nasal cannula oxygen  Post-op Assessment: Report given to RN and Post -op Vital signs reviewed and stable  Post vital signs: Reviewed and stable HR 110, RR 18, SaO2 100%, BP 134/83  Last Vitals:  Vitals Value Taken Time  BP 134/83 08/27/2018  6:19 PM  Temp    Pulse 112 08/27/2018  6:21 PM  Resp 15 08/27/2018  6:21 PM  SpO2 99 % 08/27/2018  6:21 PM  Vitals shown include unvalidated device data.  Last Pain:  Vitals:   08/27/18 1629  TempSrc: Oral  PainSc:          Complications: No apparent anesthesia complications

## 2018-08-27 NOTE — MAU Note (Signed)
Started with vag bleeding about 58mins ago. Hx previa and this is 3rd bleed and worse per pt. No pain

## 2018-08-27 NOTE — Anesthesia Preprocedure Evaluation (Signed)
Anesthesia Evaluation  Patient identified by MRN, date of birth, ID band Patient awake    Reviewed: Allergy & Precautions, NPO status , Patient's Chart, lab work & pertinent test results  Airway Mallampati: II  TM Distance: >3 FB Neck ROM: Full    Dental no notable dental hx.    Pulmonary neg pulmonary ROS,    Pulmonary exam normal breath sounds clear to auscultation       Cardiovascular hypertension, negative cardio ROS Normal cardiovascular exam Rhythm:Regular Rate:Normal     Neuro/Psych negative neurological ROS  negative psych ROS   GI/Hepatic Neg liver ROS, GERD  ,  Endo/Other  negative endocrine ROS  Renal/GU negative Renal ROS  negative genitourinary   Musculoskeletal negative musculoskeletal ROS (+)   Abdominal   Peds negative pediatric ROS (+)  Hematology negative hematology ROS (+)   Anesthesia Other Findings   Reproductive/Obstetrics (+) Pregnancy                             Anesthesia Physical Anesthesia Plan  ASA: II and emergent  Anesthesia Plan: General   Post-op Pain Management:    Induction: Intravenous, Rapid sequence and Cricoid pressure planned  PONV Risk Score and Plan: 3 and Ondansetron, Dexamethasone and Midazolam  Airway Management Planned: Oral ETT  Additional Equipment:   Intra-op Plan:   Post-operative Plan: Extubation in OR  Informed Consent: I have reviewed the patients History and Physical, chart, labs and discussed the procedure including the risks, benefits and alternatives for the proposed anesthesia with the patient or authorized representative who has indicated his/her understanding and acceptance.     Dental advisory given  Plan Discussed with: CRNA  Anesthesia Plan Comments:         Anesthesia Quick Evaluation

## 2018-08-27 NOTE — Anesthesia Postprocedure Evaluation (Signed)
Anesthesia Post Note  Patient: Melissa Ruiz  Procedure(s) Performed: CESAREAN SECTION (N/A Abdomen)     Patient location during evaluation: PACU Anesthesia Type: General Level of consciousness: awake and alert Pain management: pain level controlled Vital Signs Assessment: post-procedure vital signs reviewed and stable Respiratory status: spontaneous breathing, nonlabored ventilation and respiratory function stable Cardiovascular status: blood pressure returned to baseline and stable Postop Assessment: no apparent nausea or vomiting Anesthetic complications: no    Last Vitals:  Vitals:   08/27/18 1845 08/27/18 1900  BP: 122/83 129/77  Pulse: 92 (!) 101  Resp: 15 15  Temp:    SpO2: 100% 100%    Last Pain:  Vitals:   08/27/18 1900  TempSrc:   PainSc: 7    Pain Goal:                Epidural/Spinal Function Cutaneous sensation: Normal sensation (08/27/18 1900), Patient able to flex knees: Yes (08/27/18 1900), Patient able to lift hips off bed: Yes (08/27/18 1900), Back pain beyond tenderness at insertion site: No (08/27/18 1900), Progressively worsening motor and/or sensory loss: No (08/27/18 1900), Bowel and/or bladder incontinence post epidural: No (08/27/18 1900)  Lynda Rainwater

## 2018-08-27 NOTE — Op Note (Signed)
Jason Fila Moe PROCEDURE DATE: 08/27/2018  PREOPERATIVE DIAGNOSES: Intrauterine pregnancy at [redacted]w[redacted]d weeks gestation; placenta previa recurrent bleeding  POSTOPERATIVE DIAGNOSES: The same  PROCEDURE: Primary Low Transverse Cesarean Section  SURGEON:  Woodroe Mode, MD  ASSISTANT:  none  ANESTHESIOLOGY TEAM: No anesthesia staff entered.  INDICATIONS: Melissa Ruiz is a 33 y.o. G1P0000 at [redacted]w[redacted]d here for cesarean section secondary to the indications listed under preoperative diagnoses; please see preoperative note for further details.  The risks of cesarean section were discussed with the patient including but were not limited to: bleeding which may require transfusion or reoperation; infection which may require antibiotics; injury to bowel, bladder, ureters or other surrounding organs; injury to the fetus; need for additional procedures including hysterectomy in the event of a life-threatening hemorrhage; placental abnormalities wth subsequent pregnancies, incisional problems, thromboembolic phenomenon and other postoperative/anesthesia complications.   The patient concurred with the proposed plan, giving informed written consent for the procedure.    FINDINGS:  Viable female infant in cephalic presentation.  Apgars 7 and 8.  Clear amniotic fluid.  Intact placenta, three vessel cord.  Normal uterus, fallopian tubes and ovaries bilaterally.  ANESTHESIA: General INTRAVENOUS FLUIDS: 2000 ml   ESTIMATED BLOOD LOSS: 437 ml URINE OUTPUT:  300 ml SPECIMENS: Placenta sent to pathology COMPLICATIONS: None immediate  PROCEDURE IN DETAIL:  The patient preoperatively received intravenous antibiotics and had sequential compression devices applied to her lower extremities.  She was then taken to the operating room . She was then placed in a dorsal supine position with a leftward tilt, and prepped and draped in a sterile manner.  A foley catheter was placed into her bladder and  attached to constant gravity. General anesthesia was induced. After an adequate timeout was performed, a Pfannenstiel skin incision was made with scalpel and carried through to the underlying layer of fascia. The fascia was incised in the midline, and this incision was extended bilaterally using the Mayo scissors.  Kocher clamps were applied to the superior aspect of the fascial incision and the underlying rectus muscles were dissected off bluntly and sharply.  A similar process was carried out on the inferior aspect of the fascial incision. The rectus muscles were separated in the midline and the peritoneum was entered bluntly. The Alexis self-retaining retractor was introduced into the abdominal cavity.  Attention was turned to the lower uterine segment where a low transverse hysterotomy was made with a scalpel and extended bilaterally bluntly.  The infant was successfully delivered, the cord was clamped and cut after one minute, and the infant was handed over to the awaiting neonatology team. Uterine massage was then administered, and the placenta delivered intact with a three-vessel cord. The uterus was then cleared of clots and debris.  The hysterotomy was closed with 0 Vicryl in a running locked fashion, and an imbricating layer was also placed with 0 Vicryl.  The pelvis was cleared of all clot and debris. Hemostasis was confirmed on all surfaces.  The retractor was removed.  The peritoneum was closed with a 2-0 Vicryl running stitch. The fascia was then closed using 0 Vicryl.  The subcutaneous layer was irrigated, , and the skin was closed with a 4-0 Vicryl subcuticular stitch. The patient tolerated the procedure well. Sponge, instrument and needle counts were correct x 3.  She was taken to the recovery room in stable condition.    Woodroe Mode, MD [place], [facility], [place]

## 2018-08-27 NOTE — Progress Notes (Signed)
To O.R. via bed

## 2018-08-27 NOTE — H&P (Signed)
HPI: Melissa Ruiz is a 33 y.o. G1P0000 at [redacted]w[redacted]d who presents to maternity admissions reporting vaginal bleeding. Symptoms began about 30 minutes PTA. Pt with known placenta previa & previously admitted twice for vaginal bleeding. Received BMZ 12/25 & 12/26. Denies abdominal pain. Normal fetal movement.        Past Medical History:  Diagnosis Date  . Anemia   . DVT (deep venous thrombosis) ([place]) 2017  . Essential hypertension 09/16/2014  . Hyperlipidemia   . Pulmonary embolism ([place]) 09/2015                   OB History  Gravida Para Term Preterm AB Living  1 0 0 0 0 0  SAB TAB Ectopic Multiple Live Births     0 0 0 0 0       # Outcome Date GA Lbr Len/2nd Weight Sex Delivery Anes PTL Lv  1 Current                 Past Surgical History:  Procedure Laterality Date  . CHOLECYSTECTOMY          Family History  Problem Relation Age of Onset  . Hypertension Mother   . Hypertension Father   . Diabetes Father   . Cancer Father    Social History        Tobacco Use  . Smoking status: Never Smoker  . Smokeless tobacco: Never Used  Substance Use Topics  . Alcohol use: Not Currently    Alcohol/week: 0.0 standard drinks    Comment: rarely  . Drug use: No   No Known Allergies        Medications Prior to Admission  Medication Sig Dispense Refill Last Dose  . acetaminophen (TYLENOL) 500 MG tablet Take 1,000 mg by mouth every 6 (six) hours as needed for mild pain.   08/26/2018 at Unknown time  . aspirin EC 81 MG tablet Take 1 tablet (81 mg total) by mouth daily. Take after 12 weeks for prevention of preeclampsia later in pregnancy 300 tablet 2 08/26/2018 at Unknown time  . Cholecalciferol (VITAMIN D) 2000 units tablet Take 2,000 Units by mouth daily.   08/26/2018 at Unknown time  . enoxaparin (LOVENOX) 40 MG/0.4ML injection Inject 0.4 mLs (40 mg total) into the skin daily. 30 Syringe 12 08/26/2018 at Unknown time  . famotidine (PEPCID) 20 MG  tablet Take 1 tablet (20 mg total) by mouth 2 (two) times daily. 60 tablet 3 08/26/2018 at Unknown time  . NIFEdipine (ADALAT CC) 30 MG 24 hr tablet Take 1 tablet (30 mg total) by mouth 2 (two) times daily. 60 tablet 2 08/26/2018 at Unknown time  . Prenatal Vit-Fe Fumarate-FA (PRENATAL MULTIVITAMIN) TABS tablet Take 1 tablet by mouth daily at 12 noon.   08/26/2018 at Unknown time  . docusate sodium (COLACE) 100 MG capsule Take 1 capsule (100 mg total) by mouth 2 (two) times daily as needed for mild constipation. (Patient not taking: Reported on 08/15/2018) 30 capsule 2 Not Taking  . Elastic Bandages & Supports (COMFORT FIT MATERNITY SUPP SM) MISC 1 Units by Does not apply route daily as needed. (Patient not taking: Reported on 07/12/2018) 1 each 0 Not Taking  . HYDROcodone-acetaminophen (NORCO/VICODIN) 5-325 MG tablet Take 1-2 tablets by mouth every 6 (six) hours as needed for severe pain. (Patient not taking: Reported on 08/15/2018) 30 tablet 0 Not Taking  . traMADol (ULTRAM) 50 MG tablet Take 1 tablet (50 mg total) by mouth every 6 (  six) hours as needed. (Patient not taking: Reported on 08/15/2018) 20 tablet 0 Not Taking    I have reviewed patient's Past Medical Hx, Surgical Hx, Family Hx, Social Hx, medications and allergies.   ROS:  Review of Systems + vaginal bleeding Physical Exam   Patient Vitals for the past 24 hrs:  BP Temp Pulse Resp Height  08/27/18 0009 - - (!) 108 - -  08/27/18 0001 126/72 98.3 F (36.8 C) (!) 126 18 5\' 5"  (1.651 m)    Constitutional: Well-developed, well-nourished female in no acute distress.  Cardiovascular: normal rate & rhythm, no murmur Respiratory: normal effort, lung sounds clear throughout GI: Abd soft, non-tender, gravid appropriate for gestational age. Pos BS x 4 MS: Extremities nontender, no edema, normal ROM Neurologic: Alert and oriented x 4.  GU:    Spec exam: ~5cm clot removed from vagina, pooling of dark red blood. Cervix visually  closed  NST:  Baseline: 145 bpm, Variability: Good {> 6 bpm), Accelerations: Reactive and Decelerations: Absent     Labs: LabResultsLast24Hours        Results for orders placed or performed during the hospital encounter of 08/26/18 (from the past 24 hour(s))  Type and screen [place]     Status: None (Preliminary result)   Collection Time: 08/27/18 12:12 AM  Result Value Ref Range   ABO/RH(D) A POS    Antibody Screen PENDING    Sample Expiration      08/30/2018 Performed at [facility], [address], [place], [place] [address]   CBC on admission     Status: Abnormal   Collection Time: 08/27/18 12:12 AM  Result Value Ref Range   WBC 6.6 4.0 - 10.5 K/uL   RBC 3.47 (L) 3.87 - 5.11 MIL/uL   Hemoglobin 11.3 (L) 12.0 - 15.0 g/dL   HCT 32.8 (L) 36.0 - 46.0 %   MCV 94.5 80.0 - 100.0 fL   MCH 32.6 26.0 - 34.0 pg   MCHC 34.5 30.0 - 36.0 g/dL   RDW 13.4 11.5 - 15.5 %   Platelets 264 150 - 400 K/uL      Imaging:  No results found.   Assessment: Placenta previa with vaginal bleeding [redacted] weeks gestation  Plan: Admit to [facility] CEFM/TOCO Monitor bleeding Pt s/p Jerrell Belfast, NP 08/27/2018 12:18 AM

## 2018-08-27 NOTE — Anesthesia Procedure Notes (Signed)
Procedure Name: Intubation Date/Time: 08/27/2018 5:11 PM Performed by: Elenore Paddy, CRNA Pre-anesthesia Checklist: Patient identified, Emergency Drugs available, Suction available, Patient being monitored and Timeout performed Patient Re-evaluated:Patient Re-evaluated prior to induction Oxygen Delivery Method: Circle system utilized Preoxygenation: Pre-oxygenation with 100% oxygen Induction Type: IV induction, Rapid sequence and Cricoid Pressure applied Laryngoscope Size: Mac and 3 Grade View: Grade I Tube type: Oral Tube size: 7.0 mm Airway Equipment and Method: Stylet Placement Confirmation: ETT inserted through vocal cords under direct vision,  positive ETCO2 and breath sounds checked- equal and bilateral Secured at: 21 cm Tube secured with: Tape Dental Injury: Teeth and Oropharynx as per pre-operative assessment

## 2018-08-27 NOTE — Progress Notes (Signed)
Patient ID: Melissa Ruiz, female   DOB: Jul 23, 1986, 33 y.o.   MRN: [id number] Called to see patient for recurrent vaginal bleeding, blood in toilet and active bleeding on pad noted. Due to third episode of bleeding with placenta previa recommend cesarean section. The risks of cesarean section discussed with the patient included but were not limited to: bleeding which may require transfusion or reoperation; infection which may require antibiotics; injury to bowel, bladder, ureters or other surrounding organs; injury to the fetus; need for additional procedures including hysterectomy in the event of a life-threatening hemorrhage; placental abnormalities wth subsequent pregnancies, incisional problems, thromboembolic phenomenon and other postoperative/anesthesia complications. The patient concurred with the proposed plan, giving informed written consent for the procedure.   Patient has been NPO since 1000 she will remain NPO for procedure. Anesthesia and OR aware. Preoperative prophylactic antibiotics and SCDs ordered on call to the OR.  To OR when ready.  Woodroe Mode, MD 08/27/2018 4:21 PM

## 2018-08-27 NOTE — Progress Notes (Signed)
Called to patient's room because when she got up to the bathroom, she had a gush of bright red bleeding. Bright red blood now trickling into toilet; Dr. Roselie Awkward called to assess patient.

## 2018-08-27 NOTE — MAU Note (Signed)
Report given to [place], Therapist, sports.

## 2018-08-27 NOTE — MAU Provider Note (Signed)
Chief Complaint:  Vaginal Bleeding   First Provider Initiated Contact with Patient 08/27/18 0018     HPI: Melissa Ruiz is a 33 y.o. G1P0000 at [redacted]w[redacted]d who presents to maternity admissions reporting vaginal bleeding. Symptoms began about 30 minutes PTA. Pt with known placenta previa & previously admitted twice for vaginal bleeding. Received BMZ 12/25 & 12/26. Denies abdominal pain. Normal fetal movement.    Past Medical History:  Diagnosis Date  . Anemia   . DVT (deep venous thrombosis) ([name]) 2017  . Essential hypertension 09/16/2014  . Hyperlipidemia   . Pulmonary embolism ([place]) 09/2015   OB History  Gravida Para Term Preterm AB Living  1 0 0 0 0 0  SAB TAB Ectopic Multiple Live Births  0 0 0 0 0    # Outcome Date GA Lbr Len/2nd Weight Sex Delivery Anes PTL Lv  1 Current            Past Surgical History:  Procedure Laterality Date  . CHOLECYSTECTOMY     Family History  Problem Relation Age of Onset  . Hypertension Mother   . Hypertension Father   . Diabetes Father   . Cancer Father    Social History   Tobacco Use  . Smoking status: Never Smoker  . Smokeless tobacco: Never Used  Substance Use Topics  . Alcohol use: Not Currently    Alcohol/week: 0.0 standard drinks    Comment: rarely  . Drug use: No   No Known Allergies Medications Prior to Admission  Medication Sig Dispense Refill Last Dose  . acetaminophen (TYLENOL) 500 MG tablet Take 1,000 mg by mouth every 6 (six) hours as needed for mild pain.   08/26/2018 at Unknown time  . aspirin EC 81 MG tablet Take 1 tablet (81 mg total) by mouth daily. Take after 12 weeks for prevention of preeclampsia later in pregnancy 300 tablet 2 08/26/2018 at Unknown time  . Cholecalciferol (VITAMIN D) 2000 units tablet Take 2,000 Units by mouth daily.   08/26/2018 at Unknown time  . enoxaparin (LOVENOX) 40 MG/0.4ML injection Inject 0.4 mLs (40 mg total) into the skin daily. 30 Syringe 12 08/26/2018 at Unknown time  .  famotidine (PEPCID) 20 MG tablet Take 1 tablet (20 mg total) by mouth 2 (two) times daily. 60 tablet 3 08/26/2018 at Unknown time  . NIFEdipine (ADALAT CC) 30 MG 24 hr tablet Take 1 tablet (30 mg total) by mouth 2 (two) times daily. 60 tablet 2 08/26/2018 at Unknown time  . Prenatal Vit-Fe Fumarate-FA (PRENATAL MULTIVITAMIN) TABS tablet Take 1 tablet by mouth daily at 12 noon.   08/26/2018 at Unknown time  . docusate sodium (COLACE) 100 MG capsule Take 1 capsule (100 mg total) by mouth 2 (two) times daily as needed for mild constipation. (Patient not taking: Reported on 08/15/2018) 30 capsule 2 Not Taking  . Elastic Bandages & Supports (COMFORT FIT MATERNITY SUPP SM) MISC 1 Units by Does not apply route daily as needed. (Patient not taking: Reported on 07/12/2018) 1 each 0 Not Taking  . HYDROcodone-acetaminophen (NORCO/VICODIN) 5-325 MG tablet Take 1-2 tablets by mouth every 6 (six) hours as needed for severe pain. (Patient not taking: Reported on 08/15/2018) 30 tablet 0 Not Taking  . traMADol (ULTRAM) 50 MG tablet Take 1 tablet (50 mg total) by mouth every 6 (six) hours as needed. (Patient not taking: Reported on 08/15/2018) 20 tablet 0 Not Taking    I have reviewed patient's Past Medical Hx, Surgical Hx, Family Hx,  Social Hx, medications and allergies.   ROS:  Review of Systems + vaginal bleeding Physical Exam   Patient Vitals for the past 24 hrs:  BP Temp Pulse Resp Height  08/27/18 0009 - - (!) 108 - -  08/27/18 0001 126/72 98.3 F (36.8 C) (!) 126 18 5\' 5"  (1.651 m)    Constitutional: Well-developed, well-nourished female in no acute distress.  Cardiovascular: normal rate & rhythm, no murmur Respiratory: normal effort, lung sounds clear throughout GI: Abd soft, non-tender, gravid appropriate for gestational age. Pos BS x 4 MS: Extremities nontender, no edema, normal ROM Neurologic: Alert and oriented x 4.  GU:    Spec exam: ~5cm clot removed from vagina, pooling of dark red blood. Cervix  visually closed     Labs: Results for orders placed or performed during the hospital encounter of 08/26/18 (from the past 24 hour(s))  Type and screen [place]     Status: None (Preliminary result)   Collection Time: 08/27/18 12:12 AM  Result Value Ref Range   ABO/RH(D) A POS    Antibody Screen PENDING    Sample Expiration      08/30/2018 Performed at [facility], [address] [place], [place] [address]   CBC on admission     Status: Abnormal   Collection Time: 08/27/18 12:12 AM  Result Value Ref Range   WBC 6.6 4.0 - 10.5 K/uL   RBC 3.47 (L) 3.87 - 5.11 MIL/uL   Hemoglobin 11.3 (L) 12.0 - 15.0 g/dL   HCT 32.8 (L) 36.0 - 46.0 %   MCV 94.5 80.0 - 100.0 fL   MCH 32.6 26.0 - 34.0 pg   MCHC 34.5 30.0 - 36.0 g/dL   RDW 13.4 11.5 - 15.5 %   Platelets 264 150 - 400 K/uL    Imaging:  No results found.  MAU Course: Orders Placed This Encounter  Procedures  . CBC on admission  . RPR  . Diet regular Room service appropriate? Yes; Fluid consistency: Thin  . Notify physician (specify)  . Vital signs  . Defer vaginal exam for vaginal bleeding or PROM <37 weeks  . Initiate Oral Care Protocol  . Initiate Carrier Fluid Protocol  . Place TED hose  . Fetal monitoring every shift x 30 minutes  . Bed rest with bathroom privileges  . Full code  . Type and screen [place]  . Insert peripheral IV  . Admit to Inpatient (patient's expected length of stay will be greater than 2 midnights or inpatient only procedure)   Meds ordered this encounter  Medications  . aspirin EC tablet 81 mg    Take after 12 weeks for prevention of preeclampsia later in pregnancy    . NIFEdipine (PROCARDIA-XL/NIFEDICAL-XL) 24 hr tablet 30 mg  . enoxaparin (LOVENOX) injection 40 mg  . acetaminophen (TYLENOL) tablet 650 mg  . docusate sodium (COLACE) capsule 100 mg  . calcium carbonate (TUMS - dosed in mg elemental calcium) chewable tablet 400 mg of elemental  calcium  . prenatal multivitamin tablet 1 tablet  . DISCONTD: acetaminophen (TYLENOL) tablet 1,000 mg  . docusate sodium (COLACE) capsule 100 mg  . famotidine (PEPCID) tablet 20 mg  . lactated ringers infusion    MDM: Category 1 tracing IV started C/w Dr. Elly Modena who spoke with NICU regarding patient's admission.  Assessment: 1. Fetal heart rate/rhythm abnormality affecting management of mother   2. Placenta previa antepartum     Plan: Admit to [facility],  Junie Panning, NP 08/27/2018 12:18 AM

## 2018-08-27 NOTE — Progress Notes (Signed)
Assisted back to bed and placed on EFM.

## 2018-08-28 ENCOUNTER — Other Ambulatory Visit (HOSPITAL_COMMUNITY): Payer: Self-pay | Admitting: *Deleted

## 2018-08-28 ENCOUNTER — Encounter (HOSPITAL_COMMUNITY): Payer: Self-pay | Admitting: Obstetrics & Gynecology

## 2018-08-28 DIAGNOSIS — O4403 Placenta previa specified as without hemorrhage, third trimester: Secondary | ICD-10-CM

## 2018-08-28 LAB — CBC
HCT: 25.1 % — ABNORMAL LOW (ref 36.0–46.0)
Hemoglobin: 8.8 g/dL — ABNORMAL LOW (ref 12.0–15.0)
MCH: 32.6 pg (ref 26.0–34.0)
MCHC: 35.1 g/dL (ref 30.0–36.0)
MCV: 93 fL (ref 80.0–100.0)
Platelets: 237 10*3/uL (ref 150–400)
RBC: 2.7 MIL/uL — ABNORMAL LOW (ref 3.87–5.11)
RDW: 13.3 % (ref 11.5–15.5)
WBC: 11.5 10*3/uL — ABNORMAL HIGH (ref 4.0–10.5)
nRBC: 0.4 % — ABNORMAL HIGH (ref 0.0–0.2)

## 2018-08-28 LAB — CREATININE, SERUM
Creatinine, Ser: 0.58 mg/dL (ref 0.44–1.00)
GFR calc Af Amer: >60 mL/min (ref >60)

## 2018-08-28 MED ORDER — FERROUS SULFATE 325 (65 FE) MG PO TABS
325.0000 mg | ORAL_TABLET | Freq: Two times a day (BID) | ORAL | Status: DC
  Administered 2018-08-28 – 2018-08-30 (×5): 325 mg via ORAL
  Filled 2018-08-28 (×5): qty 1

## 2018-08-28 MED ORDER — OXYCODONE HCL 5 MG PO TABS
5.0000 mg | ORAL_TABLET | ORAL | Status: DC | PRN
  Administered 2018-08-28 – 2018-08-29 (×8): 10 mg via ORAL
  Administered 2018-08-30: 5 mg via ORAL
  Administered 2018-08-30: 10 mg via ORAL
  Filled 2018-08-28: qty 2
  Filled 2018-08-28: qty 1
  Filled 2018-08-28 (×4): qty 2
  Filled 2018-08-28: qty 1
  Filled 2018-08-28 (×3): qty 2
  Filled 2018-08-28: qty 1

## 2018-08-28 NOTE — Addendum Note (Signed)
Addendum  created 08/28/18 0752 by Hewitt Blade, CRNA   Clinical Note Signed

## 2018-08-28 NOTE — Anesthesia Postprocedure Evaluation (Signed)
Anesthesia Post Note  Patient: Melissa Ruiz  Procedure(s) Performed: CESAREAN SECTION (N/A Abdomen)     Patient location during evaluation: Women's Unit Anesthesia Type: General Level of consciousness: awake and alert and oriented Pain management: pain level controlled Vital Signs Assessment: post-procedure vital signs reviewed and stable Respiratory status: spontaneous breathing, nonlabored ventilation and respiratory function stable Cardiovascular status: stable Postop Assessment: no apparent nausea or vomiting, adequate PO intake and able to ambulate Anesthetic complications: no    Last Vitals:  Vitals:   08/28/18 0550 08/28/18 0731  BP:  138/76  Pulse:  98  Resp: 12 14  Temp:  36.8 C  SpO2: 100% 100%    Last Pain:  Vitals:   08/28/18 0748  TempSrc:   PainSc: 8    Pain Goal: Patients Stated Pain Goal: 4 (08/28/18 0550)                 Jabier Mutton

## 2018-08-28 NOTE — Progress Notes (Signed)
Subjective: Postpartum Day 1: Cesarean Delivery Patient reports incisional pain and tolerating PO.    Objective: Vital signs in last 24 hours: Temp:  [97.5 F (36.4 C)-98.3 F (36.8 C)] 98.2 F (36.8 C) (01/20 0731) Pulse Rate:  [90-115] 98 (01/20 0731) Resp:  [12-20] 14 (01/20 0731) BP: (97-138)/(61-85) 138/76 (01/20 0731) SpO2:  [96 %-100 %] 100 % (01/20 0731)  Physical Exam:  General: alert, cooperative and no distress Lochia: appropriate Uterine Fundus: firm Incision: no significant drainage DVT Evaluation: No evidence of DVT seen on physical exam.  Recent Labs    08/27/18 0012 08/28/18 0446  HGB 11.3* 8.8*  HCT 32.8* 25.1*    Assessment/Plan: Status post Cesarean section. Doing well postoperatively.  Continue current care, add iron for postop anemia.  Emeterio Reeve 08/28/2018, 7:38 AM

## 2018-08-29 MED ORDER — IBUPROFEN 800 MG PO TABS
400.0000 mg | ORAL_TABLET | Freq: Three times a day (TID) | ORAL | Status: DC
  Filled 2018-08-29: qty 1

## 2018-08-29 MED ORDER — IBUPROFEN 800 MG PO TABS
800.0000 mg | ORAL_TABLET | Freq: Three times a day (TID) | ORAL | Status: DC
  Administered 2018-08-29 (×2): 800 mg via ORAL
  Administered 2018-08-30: 400 mg via ORAL
  Filled 2018-08-29 (×3): qty 1

## 2018-08-29 MED ORDER — PHENAZOPYRIDINE HCL 200 MG PO TABS
200.0000 mg | ORAL_TABLET | Freq: Three times a day (TID) | ORAL | Status: DC | PRN
  Filled 2018-08-29: qty 1

## 2018-08-29 NOTE — Progress Notes (Signed)
Subjective: Postpartum Day 2: Cesarean Delivery Pt reports feeling sore today. Ambulating and voiding without problems. Tolerating diet. Bleeding decreasing  Objective: Vital signs in last 24 hours: Temp:  [98.2 F (36.8 C)-99.1 F (37.3 C)] 98.5 F (36.9 C) (01/21 0752) Pulse Rate:  [89-109] 109 (01/21 0752) Resp:  [18] 18 (01/21 0752) BP: (112-141)/(72-83) 124/77 (01/21 0752) SpO2:  [98 %-100 %] 100 % (01/21 0752)  Physical Exam:  General: alert Lochia: appropriate Uterine Fundus: firm Incision: healing well DVT Evaluation: No evidence of DVT seen on physical exam.  Recent Labs    08/27/18 0012 08/28/18 0446  HGB 11.3* 8.8*  HCT 32.8* 25.1*    Assessment/Plan: POD # 2 LTCS CHTN H/O PE  Stable. Start Motrin for pain. BP stable without meds. Continue with Lovenox. Probable d/c home tomorrow.  Chancy Milroy 08/29/2018, 9:04 AM

## 2018-08-29 NOTE — Lactation Note (Signed)
This note was copied from a baby's chart. Lactation Consultation Note  Patient Name: Boy Querida Beretta EBRAX'E Date: 08/29/2018 Reason for consult: Initial assessment  Initial visit at 24 hours of life. Mom is a P1 who reported + breast changes w/pregnancy during the 1st trimester (increased fullness, soreness).   Mom was set up w/a DEBP Sunday evening (08-27-18). Mom used the pump 3 times yesterday. Mom now knows to pump 8-12 times/day for the 1st couple of weeks postpartum. Hand expression was taught to Mom, but no yield was noted.   Mom's L nipple is noted to be bifurcated. Mom was observed pumping. Size 24 flanges are appropriate at this time. Mom knows how to clean pump parts.   I provided Mom with NICU booklet, pointing out to her pumping guidelines and milk storage instructions. Colostrum stickers were also provided.   Mom has [employer]. She knows her options for her pump are Medela Freestyle or Pump-in-Style. Mom will get back to me later today about her preference.     Matthias Hughs [facility] 08/29/2018, 9:53 AM

## 2018-08-30 LAB — URINALYSIS, ROUTINE W REFLEX MICROSCOPIC
Bacteria, UA: NONE SEEN
Bilirubin Urine: NEGATIVE
Glucose, UA: NEGATIVE mg/dL
Ketones, ur: NEGATIVE mg/dL
Nitrite: NEGATIVE
Protein, ur: NEGATIVE mg/dL
RBC / HPF: >50 RBC/hpf — ABNORMAL HIGH (ref 0–5)
Specific Gravity, Urine: 1.014 (ref 1.005–1.030)
pH: 5 (ref 5.0–8.0)

## 2018-08-30 MED ORDER — OXYCODONE HCL 5 MG PO TABS: 5.0000 mg | ORAL_TABLET | ORAL | 0 refills | Status: DC | PRN

## 2018-08-30 NOTE — Lactation Note (Signed)
This note was copied from a baby's chart. Lactation Consultation Note  Patient Name: Melissa Ruiz Melissa Ruiz Date: 08/30/2018 Reason for consult: Follow-up assessment;NICU baby;Preterm <34wks;Primapara;1st time breastfeeding  P1 mother whose infant is now 45 hours old and in the NICU.    Mother admitted that she has not been pumping regularly but has a plan set up for today.  She wants to pump every 2-3 hours during the day and evening.  I suggested she could go for one 4 hour interval during the night to obtain sleep.  Mother has been doing hand expression and has expressed one drop of colostrum so far which was taken to the NICU and rubbed in baby's mouth.  She has containers and labels at bedside.  Mother was given the Medela Pump In Style backpack pump.  All paperwork completed and filed in the folder in the clean utility room.  Engorgement prevention/treatment reviewed.    Informed mother that she is able to pump at baby's bedside or in the private NICU rooms as desired.  Mother will bring her pump parts when she visits and transport milk on ice.  Mother plans to visit daily.  We discussed the importance of good nutrition, hydration and sleep.  Mother had some questions which I answered to her satisfaction.  She will call for any further questions prior to discharge.  Also informed mother that we will be available for lactation assistance when her baby is ready for breast feeding.  She has our OP phone number to use as needed.   Maternal Data Formula Feeding for Exclusion: No Has patient been taught Hand Expression?: Yes Does the patient have breastfeeding experience prior to this delivery?: No  Feeding    LATCH Score                   Interventions    Lactation Tools Discussed/Used WIC Program: No Initiated by:: Already initiated   Consult Status Consult Status: Complete Date: 08/30/18 Follow-up type: Call as needed    Melissa Ruiz Melissa Ruiz 08/30/2018, 9:33  AM

## 2018-08-30 NOTE — Discharge Instructions (Signed)

## 2018-08-30 NOTE — Progress Notes (Signed)
Discharge teaching complete with pt. Pt understood all information and did not have any questions. 

## 2018-08-30 NOTE — Discharge Summary (Signed)
OB Discharge Summary     Patient Name: Melissa Ruiz DOB: 05/01/1986 MRN: [id number]  Date of admission: 08/26/2018 Delivering MD: [place] Mode   Date of discharge: 08/30/2018  Admitting diagnosis: 29 wk bleeding Intrauterine pregnancy: [redacted]w[redacted]d     Secondary diagnosis:  Active Problems:   Vaginal bleeding in pregnancy, third trimester  Additional problems: CHTN and H/O DVT/PE     Discharge diagnosis: LTCS at 29 weeks d/t vaginal bleeding                                                                                                Post partum procedures:none  Augmentation: NA  Complications: None  Hospital course: Pt was admitted with vaginal bleeding. Vaginal bleeding increased after admission d/t to this being pt's third episode of vaginal bleeding. It was elected to proceed with c section. See OP note for additional information. Pt's post op course was unremarkable. She progressed to ambulating, voiding, tolerating diet and good oral pain control. Pt was restarted on her Lovenox d/t H/O DVT/PE. BP remained stable without meds. On POD # 3 felt pt was amendable for discharge home.  Discharge instructions, medications and follow up were reviewed with pt. Pt verbalized understanding.  Infant remains stable in NICU  Physical exam  Vitals:   08/29/18 2007 08/29/18 2303 08/30/18 0405 08/30/18 0825  BP: 109/76 115/71 123/81 125/76  Pulse: (!) 105 (!) 101 97 99  Resp: 18 18 16 18   Temp: 98.2 F (36.8 C) 98.4 F (36.9 C) 98.5 F (36.9 C) 98.2 F (36.8 C)  TempSrc: Oral Oral Oral Oral  SpO2: 100% 99% 100% 99%  Weight:      Height:       General: alert Lochia: appropriate Uterine Fundus: firm Incision: Healing well with no significant drainage DVT Evaluation: No evidence of DVT seen on physical exam. Labs: Lab Results  Component Value Date   WBC 11.5 (H) 08/28/2018   HGB 8.8 (L) 08/28/2018   HCT 25.1 (L) 08/28/2018   MCV 93.0 08/28/2018   PLT 237  08/28/2018   CMP Latest Ref Rng & Units 08/28/2018  Glucose 65 - 99 mg/dL -  BUN 6 - 20 mg/dL -  Creatinine 0.44 - 1.00 mg/dL 0.58  Sodium 134 - 144 mmol/L -  Potassium 3.5 - 5.2 mmol/L -  Chloride 96 - 106 mmol/L -  CO2 20 - 29 mmol/L -  Calcium 8.7 - 10.2 mg/dL -  Total Protein 6.0 - 8.5 g/dL -  Total Bilirubin 0.0 - 1.2 mg/dL -  Alkaline Phos 39 - 117 IU/L -  AST 0 - 40 IU/L -  ALT 0 - 32 IU/L -    Discharge instruction: per After Visit Summary and "Baby and Me Booklet".  After visit meds:  Allergies as of 08/30/2018   No Known Allergies     Medication List    STOP taking these medications   acetaminophen 500 MG tablet Commonly known as:  TYLENOL   COMFORT FIT MATERNITY SUPP SM Misc   docusate sodium 100 MG capsule Commonly known as:  COLACE  famotidine 20 MG tablet Commonly known as:  PEPCID   NIFEdipine 30 MG 24 hr tablet Commonly known as:  ADALAT CC     TAKE these medications   aspirin EC 81 MG tablet Take 1 tablet (81 mg total) by mouth daily. Take after 12 weeks for prevention of preeclampsia later in pregnancy   enoxaparin 40 MG/0.4ML injection Commonly known as:  LOVENOX Inject 0.4 mLs (40 mg total) into the skin daily.   oxyCODONE 5 MG immediate release tablet Commonly known as:  Oxy IR/ROXICODONE Take 1 tablet (5 mg total) by mouth every 4 (four) hours as needed for moderate pain.   prenatal multivitamin Tabs tablet Take 1 tablet by mouth daily at 12 noon.   Vitamin D 50 MCG (2000 UT) tablet Take 2,000 Units by mouth daily.            Discharge Care Instructions  (From admission, onward)         Start     Ordered   08/30/18 0000  Discharge wound care:    Comments:  Remove dressing on Pod # 5-7   08/30/18 1214          Diet: routine diet  Activity: Advance as tolerated. Pelvic rest for 6 weeks.   Outpatient follow up:1 week for BP and incision check. 4 weeks for PP visit Follow up Appt: Future Appointments  Date Time  Provider [place]  08/31/2018  4:15 PM Osborne Oman, MD [facility]  09/08/2018 12:15 PM Marcha Solders, MD [facility]  09/22/2018  3:30 PM [place] 5 [facility]   Follow up Visit:No follow-ups on file.  Postpartum contraception: Abstinence  Newborn Data: Live born female  Birth Weight: 3 lb 2.8 oz (1440 g) APGAR: 4, 7  Newborn Delivery   Birth date/time:  08/27/2018 17:16:00 Delivery type:  C-Section, Low Transverse Trial of labor:  No C-section categorization:  Primary     Baby Feeding: Breast Disposition:NICU   08/30/2018 Chancy Milroy, MD

## 2018-08-31 ENCOUNTER — Encounter: Payer: 59 | Admitting: Obstetrics & Gynecology

## 2018-08-31 LAB — BPAM RBC
BLOOD PRODUCT EXPIRATION DATE: 202002082359
Blood Product Expiration Date: 202002082359
UNIT TYPE AND RH: 600
Unit Type and Rh: 600

## 2018-08-31 LAB — TYPE AND SCREEN
ABO/RH(D): A POS
Antibody Screen: NEGATIVE
Unit division: 0
Unit division: 0

## 2018-09-01 ENCOUNTER — Other Ambulatory Visit: Payer: Self-pay

## 2018-09-01 ENCOUNTER — Ambulatory Visit (INDEPENDENT_AMBULATORY_CARE_PROVIDER_SITE_OTHER): Payer: 59 | Admitting: *Deleted

## 2018-09-01 DIAGNOSIS — O165 Unspecified maternal hypertension, complicating the puerperium: Secondary | ICD-10-CM

## 2018-09-01 NOTE — Progress Notes (Signed)
Here for bp check- denies headache, c/o mild edema in lower legs . None noted in face, hands. Also informed patient Dr. Harolyn Rutherford called and gave orders for her to get blood work today.  Reported bp today and assessment to Dr. Roselie Awkward, no new orders- keep appointment for bp/ wound check as scheduled. Informed patient to keep appointment as scheduled for bp check/ wound check and to go to [facility]au for worsening edema, severe headache. She voices understanding.  Kweku Stankey,RN

## 2018-09-02 LAB — COMPREHENSIVE METABOLIC PANEL
ALK PHOS: 116 IU/L (ref 39–117)
ALT: 19 IU/L (ref 0–32)
AST: 23 IU/L (ref 0–40)
Albumin/Globulin Ratio: 1.5 (ref 1.2–2.2)
Albumin: 3.5 g/dL — ABNORMAL LOW (ref 3.8–4.8)
BUN / CREAT RATIO: 5 — AB (ref 9–23)
BUN: 4 mg/dL — AB (ref 6–20)
Bilirubin Total: 0.3 mg/dL (ref 0.0–1.2)
CO2: 22 mmol/L (ref 20–29)
Calcium: 9.1 mg/dL (ref 8.7–10.2)
Chloride: 104 mmol/L (ref 96–106)
Creatinine, Ser: 0.77 mg/dL (ref 0.57–1.00)
GFR calc Af Amer: 117 mL/min/{1.73_m2} (ref >59)
GFR calc non Af Amer: 101 mL/min/{1.73_m2} (ref >59)
Globulin, Total: 2.3 g/dL (ref 1.5–4.5)
Glucose: 81 mg/dL (ref 65–99)
Potassium: 3.9 mmol/L (ref 3.5–5.2)
Sodium: 142 mmol/L (ref 134–144)
Total Protein: 5.8 g/dL — ABNORMAL LOW (ref 6.0–8.5)

## 2018-09-02 LAB — CBC
Hematocrit: 23.9 % — ABNORMAL LOW (ref 34.0–46.6)
Hemoglobin: 8.4 g/dL — ABNORMAL LOW (ref 11.1–15.9)
MCH: 33.2 pg — ABNORMAL HIGH (ref 26.6–33.0)
MCHC: 35.1 g/dL (ref 31.5–35.7)
MCV: 95 fL (ref 79–97)
Platelets: 363 10*3/uL (ref 150–450)
RBC: 2.53 x10E6/uL — CL (ref 3.77–5.28)
RDW: 13.6 % (ref 11.7–15.4)
WBC: 8.9 10*3/uL (ref 3.4–10.8)

## 2018-09-02 NOTE — Progress Notes (Signed)
Patient ID: Melissa Ruiz, female   DOB: [birth year], 33 y.o.   MRN: [id number] I have reviewed the chart and agree with nursing staff's documentation of this patient's encounter.  Emeterio Reeve, MD 09/02/2018 10:45 AM

## 2018-09-04 ENCOUNTER — Other Ambulatory Visit: Payer: 59

## 2018-09-05 ENCOUNTER — Encounter: Payer: Self-pay | Admitting: *Deleted

## 2018-09-05 ENCOUNTER — Other Ambulatory Visit: Payer: Self-pay | Admitting: Obstetrics & Gynecology

## 2018-09-05 DIAGNOSIS — O9081 Anemia of the puerperium: Secondary | ICD-10-CM | POA: Insufficient documentation

## 2018-09-05 NOTE — Progress Notes (Signed)
Patient with symptomatic postpartum anemia, desires IV Feraheme. Orders placed. Office staff will arrange for patient to get this at [facility].  [name], MD

## 2018-09-07 ENCOUNTER — Ambulatory Visit: Payer: 59 | Admitting: *Deleted

## 2018-09-07 VITALS — BP 113/77 | HR 104

## 2018-09-07 DIAGNOSIS — Z4889 Encounter for other specified surgical aftercare: Secondary | ICD-10-CM

## 2018-09-07 DIAGNOSIS — O165 Unspecified maternal hypertension, complicating the puerperium: Secondary | ICD-10-CM

## 2018-09-07 NOTE — Progress Notes (Signed)
I have reviewed the chart and agree with nursing staff's documentation of this patient's encounter.  [name], MD 09/07/2018 3:56 PM

## 2018-09-07 NOTE — Progress Notes (Signed)
Pt here for BP check and incision check status post cesarean section. BP today 113/77 HR 104. Incision intact, no redness and swelling noted. To follow up in 3 weeks for postpartum visit.  Crosby Oyster, RN

## 2018-09-08 ENCOUNTER — Ambulatory Visit (INDEPENDENT_AMBULATORY_CARE_PROVIDER_SITE_OTHER): Payer: Self-pay | Admitting: Pediatrics

## 2018-09-08 ENCOUNTER — Encounter: Payer: Self-pay | Admitting: Pediatrics

## 2018-09-08 DIAGNOSIS — Z7681 Expectant parent(s) prebirth pediatrician visit: Secondary | ICD-10-CM

## 2018-09-08 NOTE — Progress Notes (Signed)
Prenatal counseling for impending newborn done-- Z76.81  

## 2018-09-12 ENCOUNTER — Ambulatory Visit (HOSPITAL_COMMUNITY)
Admission: RE | Admit: 2018-09-12 | Discharge: 2018-09-12 | Disposition: A | Discharge status: Home / Self Care | Payer: 59 | Source: Ambulatory Visit | Attending: Obstetrics & Gynecology | Admitting: Obstetrics & Gynecology

## 2018-09-12 DIAGNOSIS — O9081 Anemia of the puerperium: Secondary | ICD-10-CM | POA: Diagnosis not present

## 2018-09-12 MED ORDER — SODIUM CHLORIDE 0.9 % IV SOLN
510.0000 mg | INTRAVENOUS | Status: DC
  Administered 2018-09-12: 510 mg via INTRAVENOUS
  Filled 2018-09-12: qty 17

## 2018-09-12 NOTE — Discharge Instructions (Signed)

## 2018-09-14 ENCOUNTER — Ambulatory Visit: Payer: Self-pay

## 2018-09-14 NOTE — Lactation Note (Signed)
This note was copied from a baby's chart. Lactation Consultation Note  Patient Name: Melissa Ruiz [id number] Date: 09/14/2018 Reason for consult: Follow-up assessment;NICU baby;Primapara;1st time breastfeeding;Infant < 6lbs;Preterm <34wks;Mother's request  Visited with mom of a 64 weeks old pre-term < 4 lbs NICU female, she requested lactation because she was concern about her milk supply. Mom has been pumping every 2-3 hours at least 8 times/24 hours for 15 minutes at a time with her Medela pump at home. Mom is concern about her milk supply because she's only getting 20-30 ml per pumping session and she's been doing everything she was told. She also complained of soreness while pumping and told LC that she would like to increase the suction but she was afraid that it might be too painful. Mom not using any lubricant agent at this point; this is her 1st baby and she had a c-section.  LC examined mom in pumping room and noticed that her breast are soft, they don't show any signs of fullness. It took about 2 minutes for her to start getting her milk flowing. LC checked her flanges, mom is wearing a size # 24 and it draws her whole nipple and areola complex. Resized mom into flanges # 21, she voiced it's a little more comfortable; mom has both sets in case she needs to go back to the # 24 at some point. Discussed the importance of hydration and nutrition in milk production.  Feeding plan:  1. Mom will increase her pumping sessions to 20 minutes at a time  2. She'll power pump twice a day  3. She'll start using coconut oil prior pumping 4. She'll try the # 21 flanges at home along with coconut oil and will increase the suction to her comfort level  Mom will let her RN know when she can be seen next week for a follow up. She reported all questions and concerns were answered, she's aware of [facility] services and will call PRN.  Maternal Data    Feeding Feeding Type: Breast  Milk  Interventions Interventions: Breast feeding basics reviewed;DEBP;Breast compression;Breast massage;Expressed milk  Lactation Tools Discussed/Used Tools: Flanges Flange Size: 21   Consult Status Consult Status: Follow-up Date: 09/21/18 Follow-up type: [place] 09/14/2018, 4:14 PM

## 2018-09-19 ENCOUNTER — Ambulatory Visit (HOSPITAL_COMMUNITY)
Admission: RE | Admit: 2018-09-19 | Discharge: 2018-09-19 | Disposition: A | Discharge status: Home / Self Care | Payer: 59 | Source: Ambulatory Visit | Attending: Obstetrics & Gynecology | Admitting: Obstetrics & Gynecology

## 2018-09-19 DIAGNOSIS — O9081 Anemia of the puerperium: Secondary | ICD-10-CM | POA: Diagnosis not present

## 2018-09-19 MED ORDER — SODIUM CHLORIDE 0.9 % IV SOLN
510.0000 mg | INTRAVENOUS | Status: AC
  Administered 2018-09-19: 510 mg via INTRAVENOUS
  Filled 2018-09-19: qty 510

## 2018-09-22 ENCOUNTER — Encounter (HOSPITAL_COMMUNITY): Payer: Self-pay

## 2018-09-22 ENCOUNTER — Ambulatory Visit (HOSPITAL_COMMUNITY): Payer: 59

## 2018-09-26 ENCOUNTER — Encounter: Payer: Self-pay | Admitting: Obstetrics & Gynecology

## 2018-09-26 ENCOUNTER — Ambulatory Visit (INDEPENDENT_AMBULATORY_CARE_PROVIDER_SITE_OTHER): Payer: 59 | Admitting: Obstetrics & Gynecology

## 2018-09-26 NOTE — Progress Notes (Signed)
    Post Partum Exam  Melissa Ruiz is a 33 y.o. G23P0101 female who presents for a postpartum visit. She is 4 weeks postpartum following a low cervical transverse Cesarean section. I have fully reviewed the prenatal and intrapartum course; complicated by the problems below:  Patient Active Problem List   Diagnosis Date Noted  . Vaginal bleeding in pregnancy, third trimester 08/27/2018  . Vaginal bleeding in pregnancy 08/15/2018  . Chronic hypertension during pregnancy, antepartum 05/05/2018  . Supervision of high risk pregnancy, antepartum 04/12/2018  . Pregnancy resulting from in vitro fertilization, antepartum 04/12/2018  . History of pulmonary embolus (PE) 08/12/2017  . History of DVT (deep vein thrombosis) 08/12/2017  . Vitamin D deficiency 08/12/2017  . Goiter 08/12/2017  . Iron deficiency anemia due to chronic blood loss 10/10/2015  . Gastroesophageal reflux disease 12/06/2014  . Hyperlipidemia 12/06/2014  . Essential hypertension 09/16/2014   The delivery was at 29.4 gestational weeks due hemorrhage in the setting of known placenta previa.  Anesthesia: general. Postpartum course has been complicated by anemia, has received Feraheme. Baby's course has been complicated due to NICU admission. Baby is feeding by breast. Bleeding no bleeding. Bowel function is normal. Bladder function is normal. Patient is not sexually active. Contraception method is none. Postpartum depression screening:negative.  The following portions of the patient's history were reviewed and updated as appropriate: allergies, current medications, past family history, past medical history, past social history, past surgical history and problem list. Last pap smear done 08/29/2017 and was Normal  Review of Systems Pertinent items noted in HPI and remainder of comprehensive ROS otherwise negative.    Objective:  Blood pressure 128/80, pulse 84, weight 154 lb (69.9 kg), last menstrual period 09/03/2017,  unknown if currently breastfeeding.  General:  alert and no distress   Breasts:  inspection negative, no nipple discharge or bleeding, no masses or nodularity palpable  Lungs: clear to auscultation bilaterally  Heart:  regular rate and rhythm  Abdomen: soft, non-tender; bowel sounds normal; no masses,  no organomegaly. Incision C/D/I, no erythema, no induration, no masses  Pelvic  not evaluated        Assessment:   Normal postpartum exam. Doing well. Baby doing well in NICU.  Plan:   1. Contraception: will decide later 2. Continue Lovenox for 6 weeks postpartum; other medical issues to be managed by her other providers. 3. Follow up as needed.     [name], MD, [place] [facility] [employer], [place]

## 2018-10-04 ENCOUNTER — Ambulatory Visit: Payer: Self-pay

## 2018-10-04 NOTE — Lactation Note (Signed)
This note was copied from a baby's chart. Lactation Consultation Note  Patient Name: Melissa Ruiz [id number] Date: 10/04/2018 Reason for consult: Follow-up assessment;NICU baby;Infant < 6lbs;Primapara;1st time breastfeeding  P1 mother whose infant is 36 weeks old.  This was a 29+2 week baby with a corrected age of 9+5 weeks.    NICU requested lactation assistance.  When I arrived baby was awake and alert.  RN was preparing the feeding.  Discussed with mother the expectations for a baby at this age in relation to breast feeding.  Mother and I have spoken prior and she was familiar with most of what I reviewed with her.  She is practicing a lot of STS and will allow her baby to nuzzle at the breast.    Mother prefers the football position.  Mother's breasts are soft and non tender and her nipples are large, everted and intact.  Assisted to latch baby onto the left breast without difficulty.  However, he showed no interest in beginning to suck.  Demonstrated gentle stimulation techniques to encourage him but he was not interested.  Explained the importance of observing the baby for cues and to limit time spent at the breast if he is tired.  Mother verbalized understanding.  Placed baby STS on mother's chest while gavage feeding was being infused.    Mother will add hand expression with her pumping times to help facilitate milk production.  She currently obtains approximately 60 mls/session.  Mother will use our services as needed when she breast feeds baby.  RN aware of my visit.  Mother had no further questions/concerns at this time.    Maternal Data Has patient been taught Hand Expression?: Yes Does the patient have breastfeeding experience prior to this delivery?: No  Feeding Feeding Type: Breast Fed  LATCH Score Latch: Too sleepy or reluctant, no latch achieved, no sucking elicited.  Audible Swallowing: None  Type of Nipple: Everted at rest and after stimulation  Comfort  (Breast/Nipple): Soft / non-tender  Hold (Positioning): Assistance needed to correctly position infant at breast and maintain latch.  LATCH Score: 5  Interventions Interventions: Breast feeding basics reviewed;Assisted with latch;Skin to skin;Hand express;Breast massage;Breast compression;Position options;Support pillows;Adjust position  Lactation Tools Discussed/Used     Consult Status Consult Status: PRN Date: 10/04/18 Follow-up type: Call as needed    [name] R Demetrius Mahler 10/04/2018, 12:05 PM

## 2018-10-13 ENCOUNTER — Ambulatory Visit: Payer: Self-pay

## 2018-10-13 NOTE — Lactation Note (Signed)
This note was copied from a baby's chart. Lactation Consultation Note  Patient Name: Melissa Ruiz [id number] Date: 10/13/2018 Reason for consult: Follow-up assessment;NICU baby;Preterm <34wks Called to NICU for feeding assessment.  Baby is 36 weeks PMA.  Mom states she is pumping 8 times in 24 hours and obtains 2.5-3 ounces each pumping.  Mom has been putting baby to breast 4 times per day the past 3 days.  Mom states baby is latching consistently and feeding 15-30 minutes.  She is hearing good swallows.  Mom has many questions about feedings, pumping, etc.  Discussed normal preterm feeding behaviors and answered questions. Mom concerned that baby acts hungry 1.5-2 hours after breastfeeding.  Explained that it may take some more time for baby to transfer a whole feeding.  Mom was not pumping after breastfeeding.  Stressed importance of post pumping until baby becomes effective.  Recommended mom follow up with lactation outpatient after discharge for support.  Baby is currently awake and sucking on pacifier.  Observed mom correctly position baby in cross cradle hold.  Mom has large, erect nipples.  Baby easily latched well.  Mom shown how to give a gentle chin tug to widen gape and bring bottom lip out.  Mom comfortable with latch.  Observed baby suck with nutritive suck/swallows for 20 minutes.  Baby came off sleeping and relaxed.  Good breast softening noted.  Baby is doing very well at breast for gestational age.  Plan is for mom to put baby to breast 3 times per day, continue to post pump to protect milk supply.  Encouraged to call for further concerns/assist.     Feeding Feeding Type: Breast Fed  LATCH Score Latch: Grasps breast easily, tongue down, lips flanged, rhythmical sucking.  Audible Swallowing: Spontaneous and intermittent  Type of Nipple: Everted at rest and after stimulation  Comfort (Breast/Nipple): Soft / non-tender  Hold (Positioning): No assistance needed to correctly  position infant at breast.  LATCH Score: 10  Interventions Interventions: Support pillows  Lactation Tools Discussed/Used     Consult Status Consult Status: PRN    Ave Filter 10/13/2018, 2:50 PM

## 2019-01-24 ENCOUNTER — Encounter: Payer: Self-pay | Admitting: Family Medicine

## 2019-01-24 ENCOUNTER — Ambulatory Visit (INDEPENDENT_AMBULATORY_CARE_PROVIDER_SITE_OTHER): Payer: 59 | Admitting: Family Medicine

## 2019-01-24 VITALS — BP 118/76 | HR 86 | Temp 98.3°F | Wt 162.0 lb

## 2019-01-24 DIAGNOSIS — Z131 Encounter for screening for diabetes mellitus: Secondary | ICD-10-CM | POA: Diagnosis not present

## 2019-01-24 DIAGNOSIS — E782 Mixed hyperlipidemia: Secondary | ICD-10-CM | POA: Diagnosis not present

## 2019-01-24 DIAGNOSIS — Z0000 Encounter for general adult medical examination without abnormal findings: Secondary | ICD-10-CM

## 2019-01-24 DIAGNOSIS — Z8639 Personal history of other endocrine, nutritional and metabolic disease: Secondary | ICD-10-CM

## 2019-01-24 DIAGNOSIS — E049 Nontoxic goiter, unspecified: Secondary | ICD-10-CM

## 2019-01-24 DIAGNOSIS — Z130 Encounter for screening for diseases of the blood and blood-forming organs and certain disorders involving the immune mechanism: Secondary | ICD-10-CM

## 2019-01-24 LAB — CBC WITH DIFFERENTIAL/PLATELET
Basophils Absolute: 0 10*3/uL (ref 0.0–0.1)
Basophils Relative: 1.1 % (ref 0.0–3.0)
Eosinophils Absolute: 0.2 10*3/uL (ref 0.0–0.7)
Eosinophils Relative: 3.6 % (ref 0.0–5.0)
HCT: 39.2 % (ref 36.0–46.0)
Hemoglobin: 13.7 g/dL (ref 12.0–15.0)
Lymphocytes Relative: 48.6 % — ABNORMAL HIGH (ref 12.0–46.0)
Lymphs Abs: 2.1 10*3/uL (ref 0.7–4.0)
MCHC: 34.9 g/dL (ref 30.0–36.0)
MCV: 94 fl (ref 78.0–100.0)
Monocytes Absolute: 0.3 10*3/uL (ref 0.1–1.0)
Monocytes Relative: 7.2 % (ref 3.0–12.0)
Neutro Abs: 1.7 10*3/uL (ref 1.4–7.7)
Neutrophils Relative %: 39.5 % — ABNORMAL LOW (ref 43.0–77.0)
Platelets: 287 10*3/uL (ref 150.0–400.0)
RBC: 4.17 Mil/uL (ref 3.87–5.11)
RDW: 13.6 % (ref 11.5–15.5)
WBC: 4.4 10*3/uL (ref 4.0–10.5)

## 2019-01-24 LAB — VITAMIN D 25 HYDROXY (VIT D DEFICIENCY, FRACTURES): VITD: 31.92 ng/mL (ref 30.00–100.00)

## 2019-01-24 LAB — TSH: TSH: 1.14 u[IU]/mL (ref 0.35–4.50)

## 2019-01-24 LAB — HEMOGLOBIN A1C: Hgb A1c MFr Bld: 5.6 % (ref 4.6–6.5)

## 2019-01-24 LAB — T4, FREE: Free T4: 0.8 ng/dL (ref 0.60–1.60)

## 2019-01-24 NOTE — Progress Notes (Signed)
Subjective:     Melissa Ruiz is a 33 y.o. female and is here for a comprehensive physical exam. The patient reports no problems.  Pt had a baby boy named TJ in January.  TJ was born premature at 64 and 2.  After a stay in the NICU he is doing well.  Pt is adjusting to lack of sleep.  She returned to work last wk.  Pt would like labs including iron studies.  Social History   Socioeconomic History  . Marital status: Married    Spouse name: Not on file  . Number of children: Not on file  . Years of education: Not on file  . Highest education level: Not on file  Occupational History  . Not on file  Social Needs  . Financial resource strain: Not on file  . Food insecurity    Worry: Not on file    Inability: Not on file  . Transportation needs    Medical: Not on file    Non-medical: Not on file  Tobacco Use  . Smoking status: Never Smoker  . Smokeless tobacco: Never Used  Substance and Sexual Activity  . Alcohol use: Not Currently    Alcohol/week: 0.0 standard drinks    Comment: rarely  . Drug use: No  . Sexual activity: Yes    Birth control/protection: None  Lifestyle  . Physical activity    Days per week: Not on file    Minutes per session: Not on file  . Stress: Not on file  Relationships  . Social Herbalist on phone: Not on file    Gets together: Not on file    Attends religious service: Not on file    Active member of club or organization: Not on file    Attends meetings of clubs or organizations: Not on file    Relationship status: Not on file  . Intimate partner violence    Fear of current or ex partner: Not on file    Emotionally abused: Not on file    Physically abused: Not on file    Forced sexual activity: Not on file  Other Topics Concern  . Not on file  Social History Narrative  . Not on file   Health Maintenance  Topic Date Due  . INFLUENZA VACCINE  03/10/2019  . PAP SMEAR-Modifier  08/29/2020  . TETANUS/TDAP  08/15/2028  .  HIV Screening  Completed    The following portions of the patient's history were reviewed and updated as appropriate: allergies, current medications, past family history, past medical history, past social history, past surgical history and problem list.  Review of Systems A comprehensive review of systems was negative.   Objective:    BP 118/76 (BP Location: Left Arm, Patient Position: Sitting, Cuff Size: Normal)   Pulse 86   Temp 98.3 F (36.8 C) (Oral)   Wt 162 lb (73.5 kg)   LMP 01/02/2019 (Exact Date)   SpO2 98%   Breastfeeding Yes   BMI 26.96 kg/m  General appearance: alert, cooperative and no distress Head: Normocephalic, without obvious abnormality, atraumatic Eyes: conjunctivae/corneas clear. PERRL, EOM's intact. Fundi benign. Ears: normal TM's and external ear canals both ears Nose: Nares normal. Septum midline. Mucosa normal. No drainage or sinus tenderness. Throat: lips, mucosa, and tongue normal; teeth and gums normal Neck: no adenopathy, no carotid bruit, no JVD, supple, symmetrical, trachea midline and thyroid enlarged R>L lobe, no tenderness/mass/nodules Lungs: clear to auscultation bilaterally Heart: regular rate and  rhythm, S1, S2 normal, no murmur, click, rub or gallop Abdomen: soft, non-tender; bowel sounds normal; no masses,  no organomegaly Extremities: extremities normal, atraumatic, no cyanosis or edema Skin: Skin color, texture, turgor normal. No rashes or lesions Neurologic: Alert and oriented X 3, normal strength and tone. Normal symmetric reflexes. Normal coordination and gait    Assessment:    Healthy female exam.      Plan:     Anticipatory guidance given including wearing seatbelts, smoke detectors in the home, increasing physical activity, increasing p.o. intake of water and vegetables. -will obtain labs -given handout See After Visit Summary for Counseling Recommendations   HLD: -lipid panel  Goiter -stable -TSH, free T4  H/o Vit D  def -vitamin D 25 hydroxy  F/u prn  Grier Mitts, MD

## 2019-01-24 NOTE — Patient Instructions (Signed)
Preventive Care 18-39 Years, Female Preventive care refers to lifestyle choices and visits with your health care provider that can promote health and wellness. What does preventive care include?   A yearly physical exam. This is also called an annual well check.  Dental exams once or twice a year.  Routine eye exams. Ask your health care provider how often you should have your eyes checked.  Personal lifestyle choices, including: ? Daily care of your teeth and gums. ? Regular physical activity. ? Eating a healthy diet. ? Avoiding tobacco and drug use. ? Limiting alcohol use. ? Practicing safe sex. ? Taking vitamin and mineral supplements as recommended by your health care provider. What happens during an annual well check? The services and screenings done by your health care provider during your annual well check will depend on your age, overall health, lifestyle risk factors, and family history of disease. Counseling Your health care provider may ask you questions about your:  Alcohol use.  Tobacco use.  Drug use.  Emotional well-being.  Home and relationship well-being.  Sexual activity.  Eating habits.  Work and work environment.  Method of birth control.  Menstrual cycle.  Pregnancy history. Screening You may have the following tests or measurements:  Height, weight, and BMI.  Diabetes screening. This is done by checking your blood sugar (glucose) after you have not eaten for a while (fasting).  Blood pressure.  Lipid and cholesterol levels. These may be checked every 5 years starting at age 20.  Skin check.  Hepatitis C blood test.  Hepatitis B blood test.  Sexually transmitted disease (STD) testing.  BRCA-related cancer screening. This may be done if you have a family history of breast, ovarian, tubal, or peritoneal cancers.  Pelvic exam and Pap test. This may be done every 3 years starting at age 21. Starting at age 30, this may be done every 5  years if you have a Pap test in combination with an HPV test. Discuss your test results, treatment options, and if necessary, the need for more tests with your health care provider. Vaccines Your health care provider may recommend certain vaccines, such as:  Influenza vaccine. This is recommended every year.  Tetanus, diphtheria, and acellular pertussis (Tdap, Td) vaccine. You may need a Td booster every 10 years.  Varicella vaccine. You may need this if you have not been vaccinated.  HPV vaccine. If you are 26 or younger, you may need three doses over 6 months.  Measles, mumps, and rubella (MMR) vaccine. You may need at least one dose of MMR. You may also need a second dose.  Pneumococcal 13-valent conjugate (PCV13) vaccine. You may need this if you have certain conditions and were not previously vaccinated.  Pneumococcal polysaccharide (PPSV23) vaccine. You may need one or two doses if you smoke cigarettes or if you have certain conditions.  Meningococcal vaccine. One dose is recommended if you are age 19-21 years and a first-year college student living in a residence hall, or if you have one of several medical conditions. You may also need additional booster doses.  Hepatitis A vaccine. You may need this if you have certain conditions or if you travel or work in places where you may be exposed to hepatitis A.  Hepatitis B vaccine. You may need this if you have certain conditions or if you travel or work in places where you may be exposed to hepatitis B.  Haemophilus influenzae type b (Hib) vaccine. You may need this if you   have certain risk factors. Talk to your health care provider about which screenings and vaccines you need and how often you need them. This information is not intended to replace advice given to you by your health care provider. Make sure you discuss any questions you have with your health care provider. Document Released: 09/21/2001 Document Revised: 03/08/2017  Document Reviewed: 05/27/2015 Elsevier Interactive Patient Education  2019 [employer] Inc[employer]  Preventing Iron Deficiency Anemia, Adult  Iron deficiency is having a lack of iron in the body. Iron is an important mineral that your body needs to build healthy red blood cells. Iron deficiency anemia is a condition in which the concentration of red blood cells or hemoglobin in the blood is below normal because of too little iron. Hemoglobin is a substance in red blood cells that carries oxygen to the body's tissues. You may develop iron deficiency anemia due to:  Blood loss from an injury or condition such as Crohn's disease.  Your body being unable to properly absorb iron and use it to create red blood cells.  Lack of iron in your diet. You can prevent iron deficiency anemia by making certain changes to your diet and lifestyle. What nutrition changes can be made?  Eat foods that are high in iron, such as: ? Red meat, especially liver and beef. ? Poultry. ? Seafood. ? Dried fruit. ? Prune juice. ? Nuts. ? Pumpkin seeds. ? Beans. ? Leafy green vegetables. ? Molasses. ? Tofu.  Look for foods that have added iron (are fortified). Many cereals and breads are iron fortified.  Eat foods that contain vitamin C along with iron-rich foods, preferably in the same meal. Vitamin C increases your body's ability to absorb iron. Foods high in vitamin C include: ? Citrus fruits, such as lemons, oranges, and grapefruits. ? Berries. ? Bell peppers. ? Tomatoes. ? Broccoli.  Do not follow a diet that is very low in fat or very high in fiber.  Do not drink very large amounts of milk, tea, or coffee. What actions can I take to lower my risk? It is important to know whether you are at risk for iron deficiency anemia. Ask your health care provider if you need a blood test to measure your iron or red blood cells. You may have a higher risk for iron deficiency anemia if:  You are a woman and one of the  following applies: ? You have heavy menstrual periods. ? You are pregnant. ? You are breastfeeding.  You have had bypass surgery for weight loss.  You have a digestive disorder such as Crohn's disease, irritable bowel syndrome, or celiac disease.  You regularly take antacids or acid-lowering medicines.  You follow a vegetarian or vegan diet. To lower your risk for iron deficiency:  If you are a vegetarian or vegan, talk with your health care provider or a dietitian about: ? Taking an iron supplement. ? Adding more iron-rich foods to your diet.  Limit your use of antacids or acid-lowering medicines.  If you have heavy menstrual periods, are pregnant, or are breastfeeding, ask your health care provider about taking an iron supplement.  Work with your health care provider to manage conditions that can cause iron deficiency.  Take over-the-counter and prescription medicines only as told by your health care provider.  Keep all follow-up visits as told by your health care provider. This is important. Why are these changes important? It is important to make these changes so that you do not develop iron deficiency  anemia. Iron-rich foods help your body produce more red blood cells and have more energy. What can happen if changes are not made? If you do not make these changes, you could develop iron deficiency anemia. If not treated, iron deficiency anemia can lead to serious health complications, including:  Long-term (chronic) fatigue.  Shortness of breath.  Abnormal heart rhythms.  Heart failure.  Uncomfortable sensations and an overwhelming urge to move your legs (restless legs syndrome).  Weakened disease-fighting system (immune system). Where to find more information Learn more about preventing iron deficiency from:  [facility]: https://wilson-eaton.com/  American Society of Hematology: www.hematology.org Contact a health care provider if you:   Develop symptoms of iron deficiency, including: ? Fatigue. ? Headache. ? Pale skin, lips, and nail beds. ? Poor appetite. ? Weakness. Summary  Iron deficiency anemia is a condition in which the concentration of red blood cells or hemoglobin in the blood is below normal because of too little iron.  You can help prevent iron deficiency anemia by eating more iron-rich foods, such as red meat, poultry, or tofu. Look for foods that have added iron (are fortified), such as cereals.  Eat foods that contain vitamin C along with iron-rich foods, preferably in the same meal. Vitamin C increases the body's ability to absorb iron.  Ask your health care provider about your risk for iron deficiency anemia and whether a multivitamin or supplement may be right for you. This information is not intended to replace advice given to you by your health care provider. Make sure you discuss any questions you have with your health care provider. Document Released: 05/26/2017 Document Revised: 05/26/2017 Document Reviewed: 05/26/2017 Elsevier Interactive Patient Education  2019 [place]  A goiter is an enlarged thyroid gland. The thyroid is located in the lower front of the neck. It makes hormones that affect many body parts and systems, including the system that affects how quickly the body burns fuel for energy (metabolism). Most goiters are painless and are not a cause for concern. Some goiters can affect the way your thyroid makes thyroid hormones. Goiters and conditions that cause goiters can be treated, if necessary. What are the causes? Common causes of this condition include:  Lack (deficiency) of a mineral called iodine. The thyroid gland uses iodine to make thyroid hormones.  Diseases that attack healthy cells in the body (autoimmune diseases) and affect thyroid function, such as Graves' disease or Hashimoto's disease. These diseases may cause the body to produce too much thyroid hormone  (hyperthyroidism) or too little of the hormone (hypothyroidism).  Conditions that cause inflammation of the thyroid (thyroiditis).  One or more small growths on the thyroid (nodular goiter). Other causes include:  Medical problems caused by abnormal genes that are passed from parent to child (genetic defects).  Thyroid injury or infection.  Tumors that may or may not be cancerous.  Pregnancy.  Certain medicines.  Exposure to radiation. In some cases, the cause may not be known. What increases the risk? This condition is more likely to develop in:  People who do not get enough iodine in their diet.  People who have a family history of goiter.  Women.  People who are older than age 58.  People who smoke tobacco.  People who have had exposure to radiation. What are the signs or symptoms? The main symptom of this condition is swelling in the lower, front part of the neck. This swelling can range from a very  small bump to a large lump. Other symptoms may include:  A tight feeling in the throat.  A hoarse voice.  Coughing.  Wheezing.  Difficulty swallowing or breathing.  Bulging veins in the neck.  Dizziness. When a goiter is the result of an overactive thyroid (hyperthyroidism), symptoms may also include:  Nervousness or restlessness.  Inability to tolerate heat.  Unexplained weight loss.  Diarrhea.  Change in the texture of hair or skin.  Changes in heartbeat, such as skipped beats, extra beats, or a rapid heart rate.  Loss of menstruation.  Shaky hands.  Increased appetite.  Sleep problems. When a goiter is the result of an underactive thyroid (hypothyroidism), symptoms may also include:  Feeling like you have no energy (lethargy).  Inability to tolerate cold.  Weight gain that is not explained by a change in diet or exercise habits.  Dry skin.  Coarse hair.  Irregular menstrual periods.  Constipation.  Sadness or depression.   Fatigue. In some cases, there may not be any symptoms and the thyroid hormone levels may be normal. How is this diagnosed? This condition may be diagnosed based on your symptoms, your medical history, and a physical exam. You may have tests, such as:  Blood tests to check thyroid function.  Imaging tests, such as: ? Ultrasound. ? CT scan. ? MRI. ? Thyroid scan.  Removal of a tissue sample (biopsy) of the goiter or any nodules. The sample will be tested to check for cancer. How is this treated? Treatment for this condition depends on the cause and your symptoms. Treatment may include:  Medicines to regulate thyroid hormone levels.  Anti-inflammatory medicines or steroid medicines, if the goiter is caused by inflammation.  Iodine supplements or changes to your diet, if the goiter is caused by iodine deficiency.  Radioactive iodine treatment.  Surgery to remove your thyroid. In some cases, you may only need regular check-ups with your health care provider to monitor your condition, and you may not need treatment. Follow these instructions at home:  Follow instructions from your health care provider about any changes to your diet.  Take over-the-counter and prescription medicines only as told by your health care provider. These include supplements.  Do not use any products that contain nicotine or tobacco, such as cigarettes and e-cigarettes. If you need help quitting, ask your health care provider.  Keep all follow-up visits as told by your health care provider. This is important. Contact a health care provider if:  Your symptoms do not get better with treatment.  You have nausea, vomiting, or diarrhea. Get help right away if:  You have sudden, unexplained confusion or other mental changes.  You have a fever.  You have chest pain.  You have trouble breathing or swallowing.  You suddenly become very weak.  You experience extreme restlessness.  You feel your heart  racing. Summary  A goiter is an enlarged thyroid gland.  The thyroid gland is located in the lower front of the neck. It makes hormones that affect many body parts and systems, including the system that affects how quickly the body burns fuel for energy (metabolism).  The main symptom of this condition is swelling in the lower, front part of the neck. This swelling can range from a very small bump to a large lump.  Treatment for this condition depends on the cause and your symptoms. You may need medicines, supplements, or regular monitoring of your condition. This information is not intended to replace advice given to  you by your health care provider. Make sure you discuss any questions you have with your health care provider. Document Released: 01/13/2010 Document Revised: 04/21/2017 Document Reviewed: 04/21/2017 Elsevier Interactive Patient Education  [employer].

## 2019-01-25 LAB — BASIC METABOLIC PANEL
BUN: 11 mg/dL (ref 6–23)
CO2: 27 mEq/L (ref 19–32)
Calcium: 9.6 mg/dL (ref 8.4–10.5)
Chloride: 103 mEq/L (ref 96–112)
Creatinine, Ser: 0.91 mg/dL (ref 0.40–1.20)
GFR: 85.08 mL/min (ref >60.00)
Glucose, Bld: 77 mg/dL (ref 70–99)
Potassium: 4.2 mEq/L (ref 3.5–5.1)
Sodium: 140 mEq/L (ref 135–145)

## 2019-01-25 LAB — IBC + FERRITIN
Ferritin: 64.3 ng/mL (ref 10.0–291.0)
Iron: 148 ug/dL — ABNORMAL HIGH (ref 42–145)
Saturation Ratios: 36.8 % (ref 20.0–50.0)
Transferrin: 287 mg/dL (ref 212.0–360.0)

## 2019-01-25 LAB — LIPID PANEL
Cholesterol: 234 mg/dL — ABNORMAL HIGH (ref 0–200)
HDL: 60.9 mg/dL (ref >39.00)
LDL Cholesterol: 151 mg/dL — ABNORMAL HIGH (ref 0–99)
NonHDL: 173.32
Total CHOL/HDL Ratio: 4
Triglycerides: 114 mg/dL (ref 0.0–149.0)
VLDL: 22.8 mg/dL (ref 0.0–40.0)

## 2019-02-07 ENCOUNTER — Other Ambulatory Visit: Payer: Self-pay | Admitting: Obstetrics & Gynecology

## 2019-02-07 MED ORDER — CLOBETASOL PROPIONATE 0.05 % EX CREA: 1.0000 "application " | TOPICAL_CREAM | Freq: Two times a day (BID) | CUTANEOUS | 2 refills | Status: DC

## 2019-02-07 MED ORDER — SILVER SULFADIAZINE 1 % EX CREA: 1.0000 "application " | TOPICAL_CREAM | Freq: Two times a day (BID) | CUTANEOUS | 2 refills | Status: DC

## 2019-03-13 ENCOUNTER — Encounter: Payer: Self-pay | Admitting: Family Medicine

## 2019-03-25 ENCOUNTER — Other Ambulatory Visit: Payer: Self-pay | Admitting: Family Medicine

## 2019-03-25 MED ORDER — PRAVASTATIN SODIUM 10 MG PO TABS: 10.0000 mg | ORAL_TABLET | Freq: Every day | ORAL | 3 refills | Status: DC

## 2019-05-14 ENCOUNTER — Other Ambulatory Visit: Payer: Self-pay | Admitting: Obstetrics & Gynecology

## 2019-05-14 DIAGNOSIS — D219 Benign neoplasm of connective and other soft tissue, unspecified: Secondary | ICD-10-CM

## 2019-05-22 ENCOUNTER — Ambulatory Visit
Admission: RE | Admit: 2019-05-22 | Discharge: 2019-05-22 | Disposition: A | Discharge status: Home / Self Care | Payer: 59 | Source: Ambulatory Visit | Attending: Obstetrics & Gynecology | Admitting: Obstetrics & Gynecology

## 2019-05-22 DIAGNOSIS — D252 Subserosal leiomyoma of uterus: Secondary | ICD-10-CM | POA: Diagnosis not present

## 2019-05-22 DIAGNOSIS — D219 Benign neoplasm of connective and other soft tissue, unspecified: Secondary | ICD-10-CM

## 2019-10-05 ENCOUNTER — Telehealth (INDEPENDENT_AMBULATORY_CARE_PROVIDER_SITE_OTHER): Payer: 59 | Admitting: Family Medicine

## 2019-10-05 DIAGNOSIS — Z8639 Personal history of other endocrine, nutritional and metabolic disease: Secondary | ICD-10-CM

## 2019-10-05 DIAGNOSIS — Z862 Personal history of diseases of the blood and blood-forming organs and certain disorders involving the immune mechanism: Secondary | ICD-10-CM | POA: Diagnosis not present

## 2019-10-05 DIAGNOSIS — R5383 Other fatigue: Secondary | ICD-10-CM

## 2019-10-05 DIAGNOSIS — Z789 Other specified health status: Secondary | ICD-10-CM

## 2019-10-05 DIAGNOSIS — E782 Mixed hyperlipidemia: Secondary | ICD-10-CM

## 2019-10-05 NOTE — Progress Notes (Signed)
Virtual Visit via Video Note  I connected with Melissa Ruiz on 10/05/19 at  1:00 PM EST by a video enabled telemedicine application 2/2 [id number] pandemic and verified that I am speaking with the correct person using two identifiers.  Location patient: home Location provider:work or home office Persons participating in the virtual visit: patient, provider  I discussed the limitations of evaluation and management by telemedicine and the availability of in person appointments. The patient expressed understanding and agreed to proceed.   HPI: Pt is a pleasant 34 yo G1P1 female with pmh sig for anemia, vit D deficiency, DVT and PE seen for ongoing concern.  Pt with increased fatigue and heavy periods.  Had a h/o anemia, fibroids, and taking care of her 87 yo son, TJ.  Last u/s was negative for worsening of fibroid.  Pt unable to tolerate iron by itself, causes heart burn.  Able to tolerate it in PNV with iron.  Interested in checking labs.  Had to get Feraheme infusions in the past.  Would also like to f/u on cholesterol 2/2 family hx.  Pt started exercising and eating more vegetables.  Never started statin.  ROS: See pertinent positives and negatives per HPI.  Past Medical History:  Diagnosis Date  . Anemia   . DVT (deep venous thrombosis) ([place]) 2017  . Essential hypertension 09/16/2014  . Hyperlipidemia   . Pregnancy resulting from in vitro fertilization, antepartum 04/12/2018   Patient of Dr. Kerin Perna.  EDD 11/10/2018 by IVF dating.  [x]  fetal echo: normal except known PACs  . Pulmonary embolism ([place]) 09/2015    Past Surgical History:  Procedure Laterality Date  . CESAREAN SECTION N/A 08/27/2018   Procedure: CESAREAN SECTION;  Surgeon: Woodroe Mode, MD;  Location: [facility];  Service: Obstetrics;  Laterality: N/A;  . CHOLECYSTECTOMY      Family History  Problem Relation Age of Onset  . Hypertension Mother   . Hypertension Father   . Diabetes Father   . Cancer Father       Current Outpatient Medications:  .  clobetasol cream (TEMOVATE) [id number] %, Apply 1 application topically 2 (two) times daily. Apply to affected area, Disp: 30 g, Rfl: 2 .  pravastatin (PRAVACHOL) 10 MG tablet, Take 1 tablet (10 mg total) by mouth daily., Disp: 30 tablet, Rfl: 3 .  silver sulfADIAZINE (SILVADENE) 1 % cream, Apply 1 application topically 2 (two) times daily., Disp: 50 g, Rfl: 2  EXAM:  VITALS per patient if applicable: RR between 123456 bpm  GENERAL: alert, oriented, appears well and in no acute distress  HEENT: atraumatic, conjunctiva clear, no obvious abnormalities on inspection of external nose and ears  NECK: normal movements of the head and neck  LUNGS: on inspection no signs of respiratory distress, breathing rate appears normal, no obvious gross SOB, gasping or wheezing  CV: no obvious cyanosis  MS: moves all visible extremities without noticeable abnormality  PSYCH/NEURO: pleasant and cooperative, no obvious depression or anxiety, speech and thought processing grossly intact  ASSESSMENT AND PLAN:  Discussed the following assessment and plan:  Fatigue, unspecified type  -discussed possible causes anemia, vit D def, hypothyroidism, being a mom, DM - Plan: Lipid panel, CBC with Differential/Platelet, Comprehensive metabolic panel, Vitamin D, 25-hydroxy, Iron and TIBC, TSH, Hemoglobin A1c, T4, free, SARS CoV2 Serology(COVID19) AB(IgG,IgM),Immunoassay, Ferritin  Mixed hyperlipidemia  -continue lifestyle modifications - Plan: Lipid panel  History of vitamin D deficiency  - Plan: Vitamin D, 25-hydroxy  History of anemia  -  Plan: CBC with Differential/Platelet, Iron and TIBC, Ferritin  Works as health care professional  - Plan: SARS CoV2 Serology(COVID19) AB(IgG,IgM),Immunoassay  F/u prn   I discussed the assessment and treatment plan with the patient. The patient was provided an opportunity to ask questions and all were answered. The patient agreed  with the plan and demonstrated an understanding of the instructions.   The patient was advised to call back or seek an in-person evaluation if the symptoms worsen or if the condition fails to improve as anticipated.  Billie Ruddy, MD

## 2019-10-06 ENCOUNTER — Encounter: Payer: Self-pay | Admitting: Family Medicine

## 2019-10-08 ENCOUNTER — Other Ambulatory Visit (INDEPENDENT_AMBULATORY_CARE_PROVIDER_SITE_OTHER): Payer: 59

## 2019-10-08 ENCOUNTER — Other Ambulatory Visit: Payer: Self-pay | Admitting: Family Medicine

## 2019-10-08 ENCOUNTER — Other Ambulatory Visit: Payer: Self-pay | Admitting: Obstetrics & Gynecology

## 2019-10-08 ENCOUNTER — Other Ambulatory Visit: Payer: Self-pay

## 2019-10-08 DIAGNOSIS — R5383 Other fatigue: Secondary | ICD-10-CM | POA: Diagnosis not present

## 2019-10-08 DIAGNOSIS — E782 Mixed hyperlipidemia: Secondary | ICD-10-CM | POA: Diagnosis not present

## 2019-10-08 DIAGNOSIS — Z8639 Personal history of other endocrine, nutritional and metabolic disease: Secondary | ICD-10-CM

## 2019-10-08 DIAGNOSIS — D251 Intramural leiomyoma of uterus: Secondary | ICD-10-CM | POA: Diagnosis not present

## 2019-10-08 DIAGNOSIS — B3731 Acute candidiasis of vulva and vagina: Secondary | ICD-10-CM

## 2019-10-08 DIAGNOSIS — B373 Candidiasis of vulva and vagina: Secondary | ICD-10-CM

## 2019-10-08 DIAGNOSIS — Z319 Encounter for procreative management, unspecified: Secondary | ICD-10-CM | POA: Diagnosis not present

## 2019-10-08 DIAGNOSIS — Z862 Personal history of diseases of the blood and blood-forming organs and certain disorders involving the immune mechanism: Secondary | ICD-10-CM | POA: Diagnosis not present

## 2019-10-08 DIAGNOSIS — E559 Vitamin D deficiency, unspecified: Secondary | ICD-10-CM

## 2019-10-08 DIAGNOSIS — N808 Other endometriosis: Secondary | ICD-10-CM | POA: Diagnosis not present

## 2019-10-08 LAB — CBC WITH DIFFERENTIAL/PLATELET
Basophils Absolute: 0 10*3/uL (ref 0.0–0.1)
Basophils Relative: 0.4 % (ref 0.0–3.0)
Eosinophils Absolute: 0.2 10*3/uL (ref 0.0–0.7)
Eosinophils Relative: 2.9 % (ref 0.0–5.0)
HCT: 35.8 % — ABNORMAL LOW (ref 36.0–46.0)
Hemoglobin: 12.2 g/dL (ref 12.0–15.0)
Lymphocytes Relative: 38.3 % (ref 12.0–46.0)
Lymphs Abs: 2.1 10*3/uL (ref 0.7–4.0)
MCHC: 34.2 g/dL (ref 30.0–36.0)
MCV: 91.9 fl (ref 78.0–100.0)
Monocytes Absolute: 0.4 10*3/uL (ref 0.1–1.0)
Monocytes Relative: 6.7 % (ref 3.0–12.0)
Neutro Abs: 2.8 10*3/uL (ref 1.4–7.7)
Neutrophils Relative %: 51.7 % (ref 43.0–77.0)
Platelets: 260 10*3/uL (ref 150.0–400.0)
RBC: 3.9 Mil/uL (ref 3.87–5.11)
RDW: 13.4 % (ref 11.5–15.5)
WBC: 5.4 10*3/uL (ref 4.0–10.5)

## 2019-10-08 LAB — COMPREHENSIVE METABOLIC PANEL
ALT: 23 U/L (ref 0–35)
AST: 23 U/L (ref 0–37)
Albumin: 4.6 g/dL (ref 3.5–5.2)
Alkaline Phosphatase: 61 U/L (ref 39–117)
BUN: 8 mg/dL (ref 6–23)
CO2: 26 mEq/L (ref 19–32)
Calcium: 9.9 mg/dL (ref 8.4–10.5)
Chloride: 102 mEq/L (ref 96–112)
Creatinine, Ser: 1.03 mg/dL (ref 0.40–1.20)
GFR: 73.45 mL/min (ref >60.00)
Glucose, Bld: 83 mg/dL (ref 70–99)
Potassium: 3.9 mEq/L (ref 3.5–5.1)
Sodium: 137 mEq/L (ref 135–145)
Total Bilirubin: 0.7 mg/dL (ref 0.2–1.2)
Total Protein: 7.4 g/dL (ref 6.0–8.3)

## 2019-10-08 LAB — VITAMIN D 25 HYDROXY (VIT D DEFICIENCY, FRACTURES): VITD: 25.96 ng/mL — ABNORMAL LOW (ref 30.00–100.00)

## 2019-10-08 LAB — LIPID PANEL
Cholesterol: 212 mg/dL — ABNORMAL HIGH (ref 0–200)
HDL: 51.5 mg/dL (ref >39.00)
LDL Cholesterol: 149 mg/dL — ABNORMAL HIGH (ref 0–99)
NonHDL: 160.72
Total CHOL/HDL Ratio: 4
Triglycerides: 61 mg/dL (ref 0.0–149.0)
VLDL: 12.2 mg/dL (ref 0.0–40.0)

## 2019-10-08 LAB — T4, FREE: Free T4: 0.78 ng/dL (ref 0.60–1.60)

## 2019-10-08 LAB — TSH: TSH: 1.85 u[IU]/mL (ref 0.35–4.50)

## 2019-10-08 LAB — IBC + FERRITIN
Ferritin: 18 ng/mL (ref 10.0–291.0)
Iron: 53 ug/dL (ref 42–145)
Saturation Ratios: 11.6 % — ABNORMAL LOW (ref 20.0–50.0)
Transferrin: 326 mg/dL (ref 212.0–360.0)

## 2019-10-08 LAB — HEMOGLOBIN A1C: Hgb A1c MFr Bld: 5.4 % (ref 4.6–6.5)

## 2019-10-08 MED ORDER — VITAMIN D (ERGOCALCIFEROL) 1.25 MG (50000 UNIT) PO CAPS: 50000.0000 [IU] | ORAL_CAPSULE | ORAL | 0 refills | Status: DC

## 2019-10-08 MED ORDER — FLUCONAZOLE 150 MG PO TABS: 150.0000 mg | ORAL_TABLET | ORAL | 3 refills | Status: DC

## 2019-10-09 ENCOUNTER — Encounter: Payer: Self-pay | Admitting: Family Medicine

## 2019-10-09 ENCOUNTER — Telehealth: Payer: Self-pay

## 2019-10-09 NOTE — Telephone Encounter (Signed)
Pt state that she had requested to have prescription for Flagyl for her recurring yeast infection sent to her pharmacy but was asked to schedule appointment,state that she will not come to the office for that since she is aware of her symptoms, pt state that she contacted her OBGYN who sent her Rx.

## 2019-10-10 ENCOUNTER — Other Ambulatory Visit: Payer: 59

## 2019-10-10 DIAGNOSIS — R5383 Other fatigue: Secondary | ICD-10-CM

## 2019-10-10 LAB — SARS-COV-2 IGG: SARS-COV-2 IgG: 19.93

## 2019-10-23 ENCOUNTER — Other Ambulatory Visit: Payer: Self-pay

## 2019-10-23 ENCOUNTER — Ambulatory Visit (HOSPITAL_COMMUNITY)
Admission: RE | Admit: 2019-10-23 | Discharge: 2019-10-23 | Disposition: A | Discharge status: Home / Self Care | Payer: 59 | Source: Ambulatory Visit | Attending: Internal Medicine | Admitting: Internal Medicine

## 2019-10-23 DIAGNOSIS — D649 Anemia, unspecified: Secondary | ICD-10-CM | POA: Insufficient documentation

## 2019-10-23 MED ORDER — SODIUM CHLORIDE 0.9 % IV SOLN
510.0000 mg | Freq: Once | INTRAVENOUS | Status: AC
  Administered 2019-10-23: 510 mg via INTRAVENOUS
  Filled 2019-10-23: qty 510

## 2019-10-23 MED ORDER — SODIUM CHLORIDE 0.9 % IV SOLN
INTRAVENOUS | Status: DC | PRN
  Administered 2019-10-23: 11:00:00 250 mL via INTRAVENOUS

## 2019-10-23 NOTE — Progress Notes (Signed)
PATIENT CARE CENTER NOTE  Diagnosis: Anemia not corrected with oral iron   Provider: Grier Mitts MD   Procedure: Feraheme 510 mg   Note: Patient received IV Feraheme. Observed for at least 30 minutes post infusion.Tolerated well, vitals stable, discharge instructions given, verbalized understanding. Patient alert, oriented and ambulatory at the time of discharge.

## 2019-10-23 NOTE — Discharge Instructions (Signed)

## 2019-10-26 ENCOUNTER — Encounter: Payer: Self-pay | Admitting: Family Medicine

## 2019-11-19 ENCOUNTER — Other Ambulatory Visit (HOSPITAL_COMMUNITY): Payer: Self-pay | Admitting: Obstetrics and Gynecology

## 2019-11-19 DIAGNOSIS — N8500 Endometrial hyperplasia, unspecified: Secondary | ICD-10-CM | POA: Diagnosis not present

## 2019-11-19 DIAGNOSIS — Z3141 Encounter for fertility testing: Secondary | ICD-10-CM | POA: Diagnosis not present

## 2019-11-19 DIAGNOSIS — Z319 Encounter for procreative management, unspecified: Secondary | ICD-10-CM | POA: Diagnosis not present

## 2019-12-18 DIAGNOSIS — N809 Endometriosis, unspecified: Secondary | ICD-10-CM | POA: Diagnosis not present

## 2019-12-18 DIAGNOSIS — Z3200 Encounter for pregnancy test, result unknown: Secondary | ICD-10-CM | POA: Diagnosis not present

## 2019-12-18 DIAGNOSIS — Z3201 Encounter for pregnancy test, result positive: Secondary | ICD-10-CM | POA: Diagnosis not present

## 2019-12-18 DIAGNOSIS — N981 Hyperstimulation of ovaries: Secondary | ICD-10-CM | POA: Diagnosis not present

## 2019-12-18 DIAGNOSIS — Z113 Encounter for screening for infections with a predominantly sexual mode of transmission: Secondary | ICD-10-CM | POA: Diagnosis not present

## 2019-12-18 DIAGNOSIS — Z3183 Encounter for assisted reproductive fertility procedure cycle: Secondary | ICD-10-CM | POA: Diagnosis not present

## 2019-12-26 DIAGNOSIS — Z3183 Encounter for assisted reproductive fertility procedure cycle: Secondary | ICD-10-CM | POA: Diagnosis not present

## 2020-01-03 DIAGNOSIS — Z3200 Encounter for pregnancy test, result unknown: Secondary | ICD-10-CM | POA: Diagnosis not present

## 2020-01-08 DIAGNOSIS — Z3201 Encounter for pregnancy test, result positive: Secondary | ICD-10-CM | POA: Diagnosis not present

## 2020-01-09 DIAGNOSIS — Z3201 Encounter for pregnancy test, result positive: Secondary | ICD-10-CM | POA: Diagnosis not present

## 2020-02-12 DIAGNOSIS — Z113 Encounter for screening for infections with a predominantly sexual mode of transmission: Secondary | ICD-10-CM | POA: Diagnosis not present

## 2020-02-12 DIAGNOSIS — Z3183 Encounter for assisted reproductive fertility procedure cycle: Secondary | ICD-10-CM | POA: Diagnosis not present

## 2020-02-15 DIAGNOSIS — Z3183 Encounter for assisted reproductive fertility procedure cycle: Secondary | ICD-10-CM | POA: Diagnosis not present

## 2020-02-15 DIAGNOSIS — Z113 Encounter for screening for infections with a predominantly sexual mode of transmission: Secondary | ICD-10-CM | POA: Diagnosis not present

## 2020-02-18 DIAGNOSIS — Z3183 Encounter for assisted reproductive fertility procedure cycle: Secondary | ICD-10-CM | POA: Diagnosis not present

## 2020-02-18 DIAGNOSIS — Z113 Encounter for screening for infections with a predominantly sexual mode of transmission: Secondary | ICD-10-CM | POA: Diagnosis not present

## 2020-02-20 DIAGNOSIS — Z3183 Encounter for assisted reproductive fertility procedure cycle: Secondary | ICD-10-CM | POA: Diagnosis not present

## 2020-02-22 DIAGNOSIS — Z3183 Encounter for assisted reproductive fertility procedure cycle: Secondary | ICD-10-CM | POA: Diagnosis not present

## 2020-02-27 DIAGNOSIS — Z3183 Encounter for assisted reproductive fertility procedure cycle: Secondary | ICD-10-CM | POA: Diagnosis not present

## 2020-02-28 DIAGNOSIS — Z3183 Encounter for assisted reproductive fertility procedure cycle: Secondary | ICD-10-CM | POA: Diagnosis not present

## 2020-03-19 DIAGNOSIS — Z3141 Encounter for fertility testing: Secondary | ICD-10-CM | POA: Diagnosis not present

## 2020-03-19 DIAGNOSIS — Z3183 Encounter for assisted reproductive fertility procedure cycle: Secondary | ICD-10-CM | POA: Diagnosis not present

## 2020-03-25 DIAGNOSIS — Z3141 Encounter for fertility testing: Secondary | ICD-10-CM | POA: Diagnosis not present

## 2020-03-25 DIAGNOSIS — N8500 Endometrial hyperplasia, unspecified: Secondary | ICD-10-CM | POA: Diagnosis not present

## 2020-04-16 DIAGNOSIS — Z3183 Encounter for assisted reproductive fertility procedure cycle: Secondary | ICD-10-CM | POA: Diagnosis not present

## 2020-04-16 DIAGNOSIS — Z113 Encounter for screening for infections with a predominantly sexual mode of transmission: Secondary | ICD-10-CM | POA: Diagnosis not present

## 2020-04-17 DIAGNOSIS — Z3183 Encounter for assisted reproductive fertility procedure cycle: Secondary | ICD-10-CM | POA: Diagnosis not present

## 2020-04-17 DIAGNOSIS — Z113 Encounter for screening for infections with a predominantly sexual mode of transmission: Secondary | ICD-10-CM | POA: Diagnosis not present

## 2020-04-18 ENCOUNTER — Other Ambulatory Visit (HOSPITAL_COMMUNITY): Payer: Self-pay | Admitting: Obstetrics and Gynecology

## 2020-05-09 DIAGNOSIS — Z113 Encounter for screening for infections with a predominantly sexual mode of transmission: Secondary | ICD-10-CM | POA: Diagnosis not present

## 2020-05-09 DIAGNOSIS — Z3183 Encounter for assisted reproductive fertility procedure cycle: Secondary | ICD-10-CM | POA: Diagnosis not present

## 2020-05-14 ENCOUNTER — Other Ambulatory Visit (HOSPITAL_COMMUNITY): Payer: Self-pay | Admitting: Obstetrics and Gynecology

## 2020-05-15 DIAGNOSIS — Z3183 Encounter for assisted reproductive fertility procedure cycle: Secondary | ICD-10-CM | POA: Diagnosis not present

## 2020-05-23 DIAGNOSIS — Z3200 Encounter for pregnancy test, result unknown: Secondary | ICD-10-CM | POA: Diagnosis not present

## 2020-05-23 DIAGNOSIS — Z3201 Encounter for pregnancy test, result positive: Secondary | ICD-10-CM | POA: Diagnosis not present

## 2020-05-27 ENCOUNTER — Other Ambulatory Visit (HOSPITAL_COMMUNITY): Payer: Self-pay | Admitting: Obstetrics and Gynecology

## 2020-05-29 ENCOUNTER — Other Ambulatory Visit: Payer: Self-pay | Admitting: Family Medicine

## 2020-05-29 ENCOUNTER — Encounter: Payer: Self-pay | Admitting: Family Medicine

## 2020-07-08 ENCOUNTER — Encounter: Payer: Self-pay | Admitting: Family Medicine

## 2020-07-21 ENCOUNTER — Encounter: Payer: Self-pay | Admitting: Family Medicine

## 2020-07-21 ENCOUNTER — Other Ambulatory Visit: Payer: Self-pay | Admitting: Family Medicine

## 2020-07-21 DIAGNOSIS — E782 Mixed hyperlipidemia: Secondary | ICD-10-CM

## 2020-07-21 MED ORDER — ROSUVASTATIN CALCIUM 5 MG PO TABS: 5.0000 mg | ORAL_TABLET | Freq: Every day | ORAL | 3 refills | Status: DC

## 2020-07-23 ENCOUNTER — Encounter: Payer: Self-pay | Admitting: Family Medicine

## 2020-07-23 ENCOUNTER — Other Ambulatory Visit (HOSPITAL_COMMUNITY): Payer: Self-pay | Admitting: Internal Medicine

## 2020-07-24 ENCOUNTER — Other Ambulatory Visit: Payer: Self-pay | Admitting: Family Medicine

## 2020-07-24 DIAGNOSIS — K219 Gastro-esophageal reflux disease without esophagitis: Secondary | ICD-10-CM

## 2020-07-24 MED ORDER — FAMOTIDINE 20 MG PO TABS: 20.0000 mg | ORAL_TABLET | Freq: Two times a day (BID) | ORAL | 1 refills | Status: DC

## 2020-08-05 ENCOUNTER — Other Ambulatory Visit: Payer: Self-pay | Admitting: Obstetrics and Gynecology

## 2020-08-26 ENCOUNTER — Ambulatory Visit: Payer: 59 | Admitting: Obstetrics & Gynecology

## 2020-08-26 ENCOUNTER — Other Ambulatory Visit: Payer: Self-pay | Admitting: Obstetrics & Gynecology

## 2020-08-26 DIAGNOSIS — B3731 Acute candidiasis of vulva and vagina: Secondary | ICD-10-CM

## 2020-08-26 DIAGNOSIS — B373 Candidiasis of vulva and vagina: Secondary | ICD-10-CM

## 2020-08-26 MED ORDER — FLUCONAZOLE 150 MG PO TABS: 150.0000 mg | ORAL_TABLET | ORAL | 3 refills | Status: DC

## 2020-08-26 NOTE — Progress Notes (Signed)
Patient called reporting yeast vulvovaginitis, requested Diflucan prescription. This was prescribed. Will monitor symptoms accordingly.   [name], MD

## 2020-09-08 ENCOUNTER — Encounter: Payer: Self-pay | Admitting: Obstetrics & Gynecology

## 2020-09-08 ENCOUNTER — Ambulatory Visit (INDEPENDENT_AMBULATORY_CARE_PROVIDER_SITE_OTHER): Payer: 59 | Admitting: Obstetrics & Gynecology

## 2020-09-08 ENCOUNTER — Other Ambulatory Visit (HOSPITAL_COMMUNITY)
Admission: RE | Admit: 2020-09-08 | Discharge: 2020-09-08 | Disposition: A | Discharge status: Home / Self Care | Payer: 59 | Source: Ambulatory Visit | Attending: Obstetrics & Gynecology | Admitting: Obstetrics & Gynecology

## 2020-09-08 ENCOUNTER — Other Ambulatory Visit: Payer: Self-pay

## 2020-09-08 VITALS — BP 134/84 | HR 85 | Ht 65.0 in | Wt 157.8 lb

## 2020-09-08 DIAGNOSIS — Z01419 Encounter for gynecological examination (general) (routine) without abnormal findings: Secondary | ICD-10-CM | POA: Diagnosis not present

## 2020-09-08 DIAGNOSIS — Z0000 Encounter for general adult medical examination without abnormal findings: Secondary | ICD-10-CM | POA: Diagnosis not present

## 2020-09-08 NOTE — Progress Notes (Signed)
GYNECOLOGY ANNUAL PREVENTATIVE CARE ENCOUNTER NOTE  History:     Melissa Ruiz is a 35 y.o. G10P0101 female here for a routine annual gynecologic exam.  Current complaints: occasional episodes of spotting after intercourse, attributes this to possible ectropion.  History of fibroids, scheduled for laparoscopic surgery with Dr. Kerin Perna in 11/21/2020. Wants to proceed with another IVF cycle after surgery.   Denies other abnormal vaginal bleeding, discharge, pelvic pain, problems with intercourse or other gynecologic concerns.    Gynecologic History Patient's last menstrual period was 08/22/2020 (approximate). Contraception: none Last Pap: 08/29/2017. Results were: normal with negative HPV  Obstetric History OB History  Gravida Para Term Preterm AB Living  1 1 0 1 0 1  SAB IAB Ectopic Multiple Live Births  0 0 0 0 1    # Outcome Date GA Lbr Len/2nd Weight Sex Delivery Anes PTL Lv  1 Preterm 08/27/18 [redacted]w[redacted]d  3 lb 2.8 oz (1.44 kg) M CS-LTranv Gen  LIV    Past Medical History:  Diagnosis Date  . Anemia   . DVT (deep venous thrombosis) ([place]) 2017  . Essential hypertension 09/16/2014  . Hyperlipidemia   . Pregnancy resulting from in vitro fertilization, antepartum 04/12/2018   Patient of Dr. Kerin Perna.  EDD 11/10/2018 by IVF dating.  [x]  fetal echo: normal except known PACs  . Pulmonary embolism ([place]) 09/2015    Past Surgical History:  Procedure Laterality Date  . CESAREAN SECTION N/A 08/27/2018   Procedure: CESAREAN SECTION;  Surgeon: Woodroe Mode, MD;  Location: [facility];  Service: Obstetrics;  Laterality: N/A;  . CHOLECYSTECTOMY      Current Outpatient Medications on File Prior to Visit  Medication Sig Dispense Refill  . famotidine (PEPCID) 20 MG tablet Take 1 tablet (20 mg total) by mouth 2 (two) times daily. 60 tablet 1  . clobetasol cream (TEMOVATE) 4.01 % Apply 1 application topically 2 (two) times daily. Apply to affected area (Patient not taking:  Reported on 09/08/2020) 30 g 2  . fluconazole (DIFLUCAN) 150 MG tablet Take 1 tablet (150 mg total) by mouth every 3 (three) days. For three doses (Patient not taking: Reported on 09/08/2020) 3 tablet 3  . rosuvastatin (CRESTOR) 5 MG tablet Take 1 tablet (5 mg total) by mouth daily. (Patient not taking: Reported on 09/08/2020) 30 tablet 3  . silver sulfADIAZINE (SILVADENE) 1 % cream Apply 1 application topically 2 (two) times daily. (Patient not taking: Reported on 09/08/2020) 50 g 2  . Vitamin D, Ergocalciferol, (DRISDOL) 1.25 MG (50000 UNIT) CAPS capsule Take 1 capsule (50,000 Units total) by mouth every 7 (seven) days. (Patient not taking: Reported on 09/08/2020) 12 capsule 0   No current facility-administered medications on file prior to visit.    No Known Allergies  Social History:  reports that she has never smoked. She has never used smokeless tobacco. She reports previous alcohol use. She reports that she does not use drugs.  Family History  Problem Relation Age of Onset  . Hypertension Mother   . Hypertension Father   . Diabetes Father   . Cancer Father     The following portions of the patient's history were reviewed and updated as appropriate: allergies, current medications, past family history, past medical history, past social history, past surgical history and problem list.  Review of Systems Pertinent items noted in HPI and remainder of comprehensive ROS otherwise negative.  Physical Exam:  BP 134/84   Pulse 85   Ht 5\' 5"  (  1.651 m)   Wt 157 lb 12.8 oz (71.6 kg)   LMP 08/22/2020 (Approximate)   BMI 26.26 kg/m  CONSTITUTIONAL: Well-developed, well-nourished female in no acute distress.  HENT:  Normocephalic, atraumatic, External right and left ear normal.  EYES: Conjunctivae and EOM are normal. Pupils are equal, round, and reactive to light. No scleral icterus.  NECK: Normal range of motion, supple, no masses.  Normal thyroid.  SKIN: Skin is warm and dry. No rash noted.  Not diaphoretic. No erythema. No pallor. MUSCULOSKELETAL: Normal range of motion. No tenderness.  No cyanosis, clubbing, or edema.   NEUROLOGIC: Alert and oriented to person, place, and time. Normal reflexes, muscle tone coordination.  PSYCHIATRIC: Normal mood and affect. Normal behavior. Normal judgment and thought content. CARDIOVASCULAR: Normal heart rate noted, regular rhythm RESPIRATORY: Clear to auscultation bilaterally. Effort and breath sounds normal, no problems with respiration noted. BREASTS: Symmetric in size. No masses, tenderness, skin changes, nipple drainage, or lymphadenopathy bilaterally. Performed in the presence of a chaperone. ABDOMEN: Soft, no distention noted.  No tenderness, rebound or guarding. Well healed previous surgical scars with keloids.  PELVIC: Normal appearing external genitalia and urethral meatus; normal appearing vaginal mucosa and cervix.  No abnormal discharge noted.  Pap smear obtained.  Normal uterine size, no other palpable masses, no uterine or adnexal tenderness.  Performed in the presence of a chaperone.   Assessment and Plan:    1. Well woman exam with routine gynecological exam - Cytology - PAP Will follow up results of pap smear and manage accordingly. She will follow up with Dr. Kerin Perna as scheduled. Routine preventative health maintenance measures emphasized. Please refer to After Visit Summary for other counseling recommendations.      [name], MD, [facility] [employer], [place]

## 2020-09-08 NOTE — Patient Instructions (Signed)
Preventive Care 21-37 Years Old, Female Preventive care refers to lifestyle choices and visits with your health care provider that can promote health and wellness. This includes:  A yearly physical exam. This is also called an annual wellness visit.  Regular dental and eye exams.  Immunizations.  Screening for certain conditions.  Healthy lifestyle choices, such as: ? Eating a healthy diet. ? Getting regular exercise. ? Not using drugs or products that contain nicotine and tobacco. ? Limiting alcohol use. What can I expect for my preventive care visit? Physical exam Your health care provider may check your:  Height and weight. These may be used to calculate your BMI (body mass index). BMI is a measurement that tells if you are at a healthy weight.  Heart rate and blood pressure.  Body temperature.  Skin for abnormal spots. Counseling Your health care provider may ask you questions about your:  Past medical problems.  Family's medical history.  Alcohol, tobacco, and drug use.  Emotional well-being.  Home life and relationship well-being.  Sexual activity.  Diet, exercise, and sleep habits.  Work and work environment.  Access to firearms.  Method of birth control.  Menstrual cycle.  Pregnancy history. What immunizations do I need? Vaccines are usually given at various ages, according to a schedule. Your health care provider will recommend vaccines for you based on your age, medical history, and lifestyle or other factors, such as travel or where you work.   What tests do I need? Blood tests  Lipid and cholesterol levels. These may be checked every 5 years starting at age 20.  Hepatitis C test.  Hepatitis B test. Screening  Diabetes screening. This is done by checking your blood sugar (glucose) after you have not eaten for a while (fasting).  STD (sexually transmitted disease) testing, if you are at risk.  BRCA-related cancer screening. This may be  done if you have a family history of breast, ovarian, tubal, or peritoneal cancers.  Pelvic exam and Pap test. This may be done every 3 years starting at age 21. Starting at age 30, this may be done every 5 years if you have a Pap test in combination with an HPV test. Talk with your health care provider about your test results, treatment options, and if necessary, the need for more tests.   Follow these instructions at home: Eating and drinking  Eat a healthy diet that includes fresh fruits and vegetables, whole grains, lean protein, and low-fat dairy products.  Take vitamin and mineral supplements as recommended by your health care provider.  Do not drink alcohol if: ? Your health care provider tells you not to drink. ? You are pregnant, may be pregnant, or are planning to become pregnant.  If you drink alcohol: ? Limit how much you have to 0-1 drink a day. ? Be aware of how much alcohol is in your drink. In the [place] one drink equals one 12 oz bottle of beer (355 mL), one 5 oz glass of wine (148 mL), or one 1 oz glass of hard liquor (44 mL).   Lifestyle  Take daily care of your teeth and gums. Brush your teeth every morning and night with fluoride toothpaste. Floss one time each day.  Stay active. Exercise for at least 30 minutes 5 or more days each week.  Do not use any products that contain nicotine or tobacco, such as cigarettes, e-cigarettes, and chewing tobacco. If you need help quitting, ask your health care provider.  Do not   use drugs.  If you are sexually active, practice safe sex. Use a condom or other form of protection to prevent STIs (sexually transmitted infections).  If you do not wish to become pregnant, use a form of birth control. If you plan to become pregnant, see your health care provider for a prepregnancy visit.  Find healthy ways to cope with stress, such as: ? Meditation, yoga, or listening to music. ? Journaling. ? Talking to a trusted  person. ? Spending time with friends and family. Safety  Always wear your seat belt while driving or riding in a vehicle.  Do not drive: ? If you have been drinking alcohol. Do not ride with someone who has been drinking. ? When you are tired or distracted. ? While texting.  Wear a helmet and other protective equipment during sports activities.  If you have firearms in your house, make sure you follow all gun safety procedures.  Seek help if you have been physically or sexually abused. What's next?  Go to your health care provider once a year for an annual wellness visit.  Ask your health care provider how often you should have your eyes and teeth checked.  Stay up to date on all vaccines. This information is not intended to replace advice given to you by your health care provider. Make sure you discuss any questions you have with your health care provider. Document Revised: 03/23/2020 Document Reviewed: 04/06/2018 Elsevier Patient Education  2021 Elsevier Inc.  

## 2020-09-12 LAB — CYTOLOGY - PAP
Comment: NEGATIVE
Diagnosis: NEGATIVE
High risk HPV: NEGATIVE

## 2020-09-19 IMAGING — US US MFM OB FOLLOW-UP
1 series · 13 of 28 positions shown · non-contrast
Comparison: none

[Series 1: us mfm ob follow-up · 46 acquisitions, 13 frames shown]
[im 2/46]
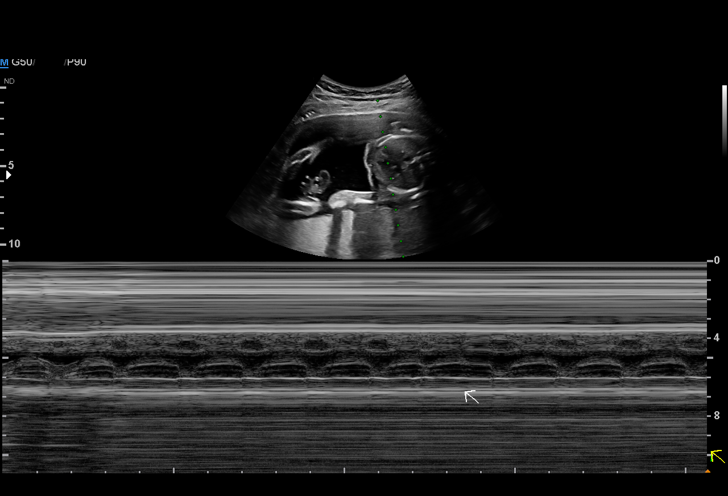
[im 6/46]
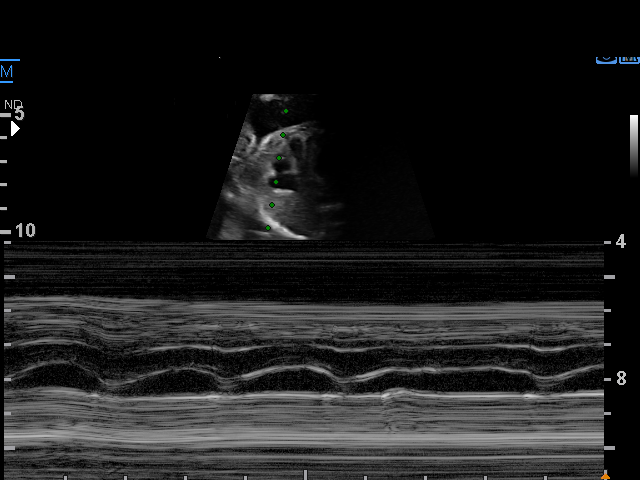
[im 9/46]
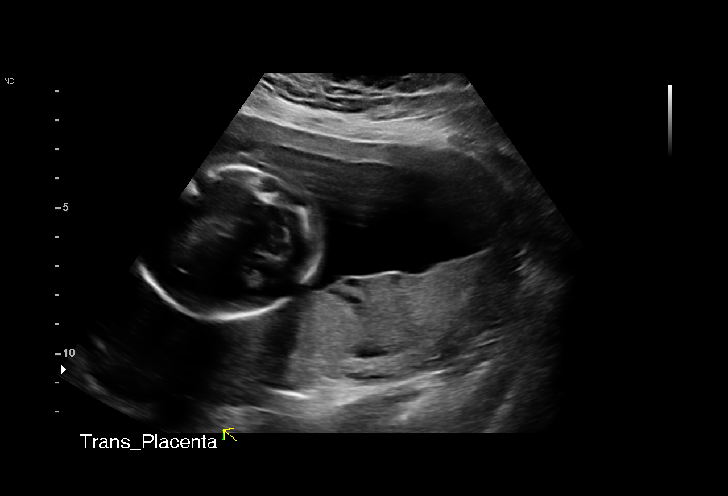
[im 12/46]
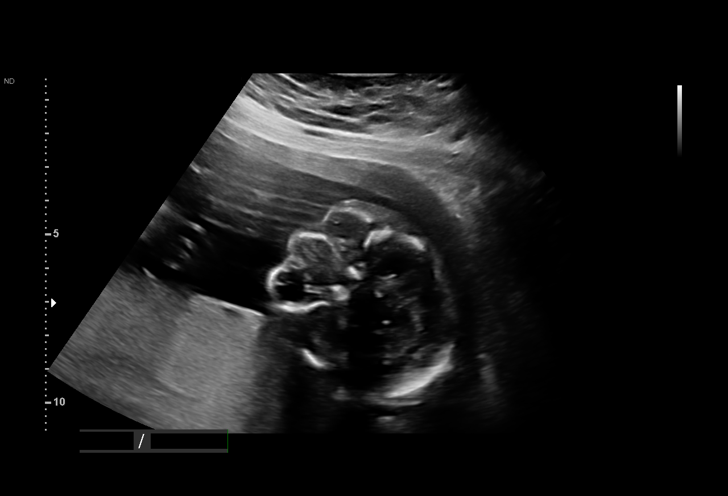
[im 16/46]
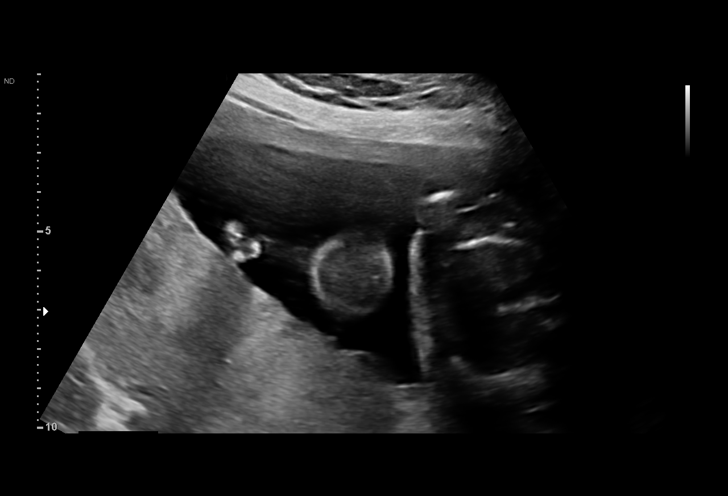
[im 19/46]
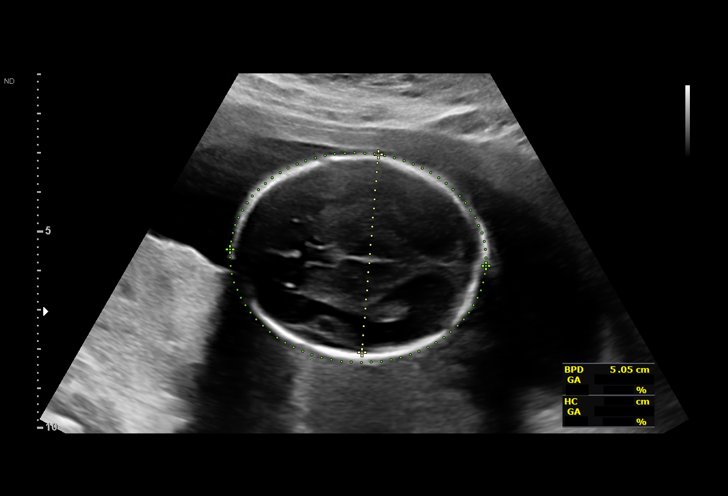
[im 24/46]
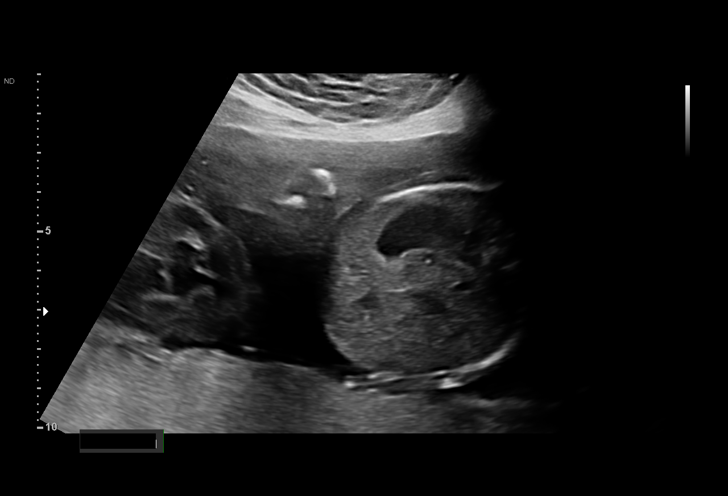
[im 27/46]
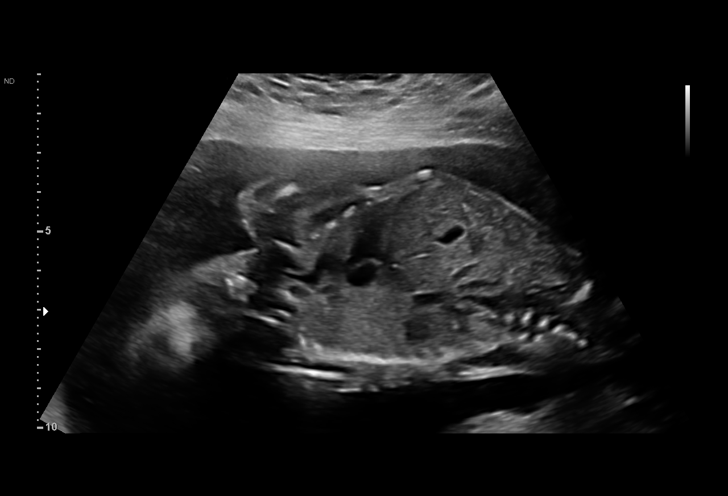
[im 31/46]
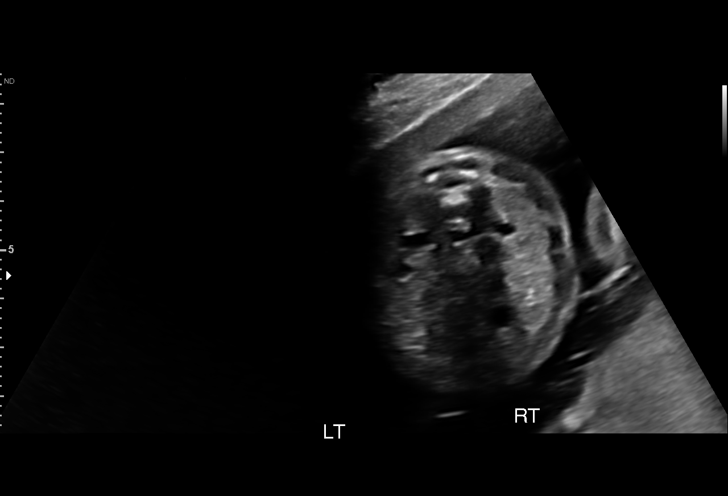
[im 34/46]
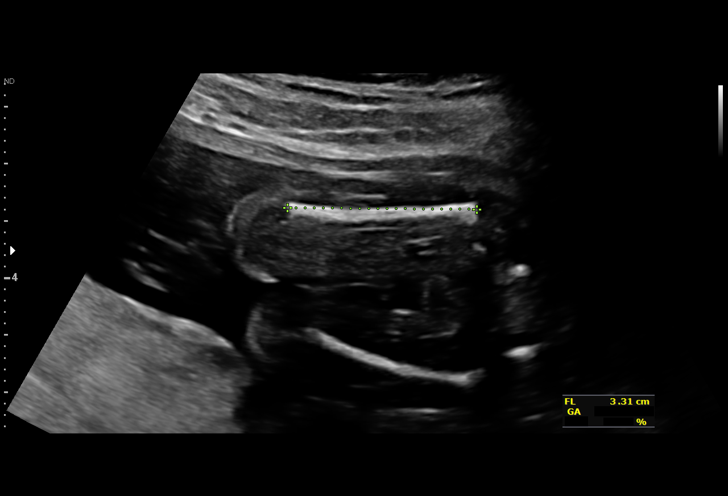
[im 37/46]
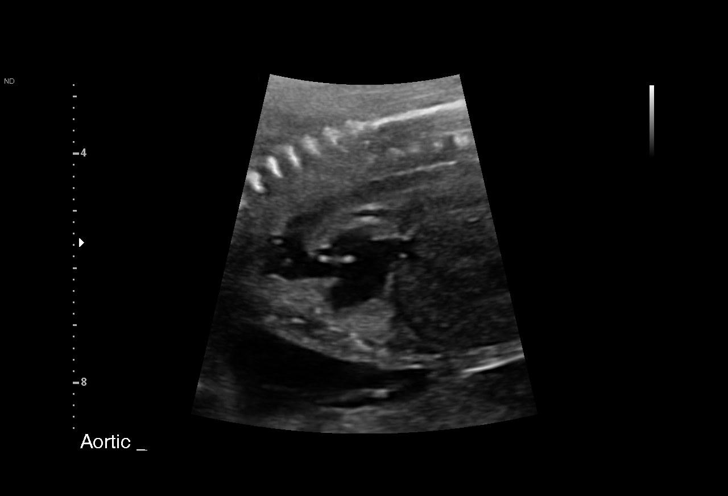
[im 41/46]
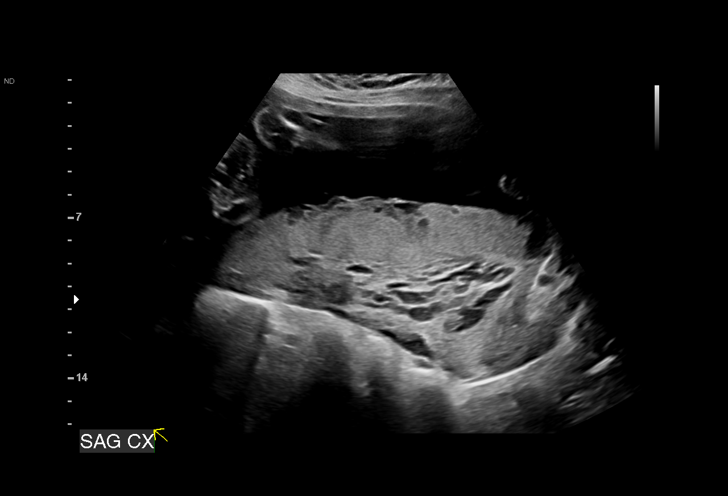
[im 44/46]
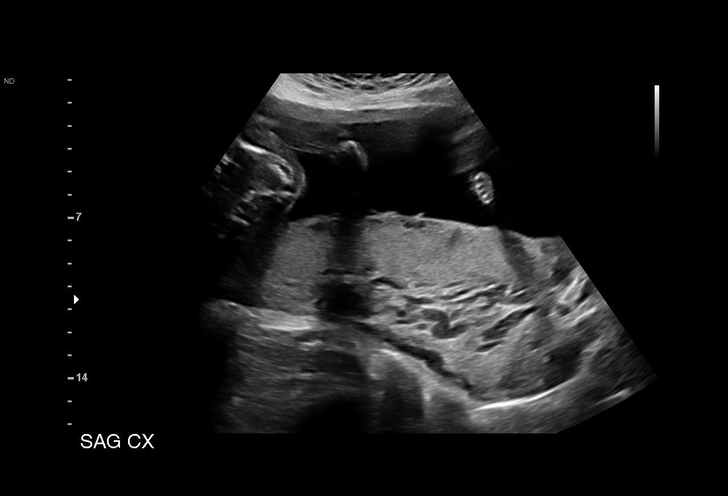

[13 of 28 positions shown; findings below may reference images not displayed]

ZARDINI

                                                            [REDACTED]
                   [id number]

 ----------------------------------------------------------------------

 ----------------------------------------------------------------------
Indications

  Uterine fibroids affecting pregnancy in        O34.12,
  second trimester, antepartum
  20 weeks gestation of pregnancy
  Hypertension - Chronic/Pre-existing
  Pregnancy resulting from assisted
  reproductive technology
  Deep vein thrombosis (DVT) on Lovenox
  Antenatal follow-up for nonvisualized fetal
  anatomy
 ----------------------------------------------------------------------
Vital Signs

                                                Height:        5'5"
Fetal Evaluation

 Num Of Fetuses:         1
 Fetal Heart Rate(bpm):  154
 Cardiac Activity:       Arrhythmia noted (PAC's)
 Presentation:           Breech
 Placenta:               Posterior Previa

 Amniotic Fluid
 AFI FV:      Within normal limits

                             Largest Pocket(cm)

Biometry

 BPD:      50.6  mm     G. Age:  21w 2d         74  %    CI:        73.59   %    70 - 86
                                                         FL/HC:      17.7   %    15.9 -
 HC:      187.4  mm     G. Age:  21w 0d         56  %    HC/AC:      1.11        1.06 -
 AC:      168.4  mm     G. Age:  21w 6d         78  %    FL/BPD:     65.4   %
 FL:       33.1  mm     G. Age:  20w 2d         29  %    FL/AC:      19.7   %    20 - 24

 LV:        6.5  mm

 Est. FW:     404  gm    0 lb 14 oz      53  %
OB History

 Gravidity:    1
Gestational Age

 U/S Today:     21w 1d                                        EDD:   11/07/18
 Best:          20w 5d     Det. By:  D.O. Conception          EDD:   11/10/18
                                     (02/17/18)
Anatomy

 Cranium:               Appears normal         LVOT:                   Previously seen
 Cavum:                 Previously seen        Aortic Arch:            Appears normal
 Ventricles:            Appears normal         Ductal Arch:            Previously seen
 Choroid Plexus:        Previously seen        Diaphragm:              Appears normal
 Cerebellum:            Previously seen        Stomach:                Appears normal, left
                                                                       sided
 Posterior Fossa:       Previously seen        Abdomen:                Appears normal
 Nuchal Fold:           Previously seen        Abdominal Wall:         Previously seen
 Face:                  Appears normal         Cord Vessels:           Previously seen
                        (orbits and profile)
 Lips:                  Appears normal         Kidneys:                Appear normal
 Palate:                Not well visualized    Bladder:                Appears normal
 Thoracic:              Appears normal         Spine:                  Previously seen
 Heart:                 Appears normal         Upper Extremities:      Previously seen
                        (4CH, axis, and situs
 RVOT:                  Not well visualized    Lower Extremities:      Previously seen

 Other:  Male gender previously seen. Heels and Open hands previously
         visualized. Nasal bone previously visualized.
Cervix Uterus Adnexa

 Cervix
 Length:           3.35  cm.
 Normal appearance by transabdominal scan.
Myomas

  Site                     L(cm)      W(cm)      D(cm)      Location
  Right
 ----------------------------------------------------------------------

  Blood Flow                 RI        PI       Comments
 ----------------------------------------------------------------------
Comments

 We observed intermittent and irregular premature atrial
 contractions (PAC's). PAC's are the most common fetal
 arrhythmia in pregnancy, manifest as an irregularly irregular
 variability in fetal heart rate, usually beginning between 18
 and 24 weeks' gestation, but occasionally appearing initially
 during the third trimester. Rarely, PACs are associated with
 intermittent SVT. PACs may be exacerbated by ingestion of
 caffeine, decongestant medications (stimulants), or tobacco.
 Typically, PACs resolve spontaneously within 2 to 3 weeks
 after diagnosis. PACs do not represent any real risk to the
 fetus, and they do not require treatment. However, a small
 percentage of fetuses with isolated PACs develop
 supraventricular tachycardia (SVT).

 We recommend weekly monitoring with fetal ascultation until
 resolution..
Impression

 Normal interval growth.
 Premature atrial contractions
 IVF
 Posterior placenta previa
Recommendations

 Follow up in 4 weeks growth
 Assessment of mode of delivery at 34-35 weeks.

## 2020-10-10 ENCOUNTER — Encounter: Payer: 59 | Admitting: Family Medicine

## 2020-10-29 ENCOUNTER — Other Ambulatory Visit: Payer: Self-pay | Admitting: Family Medicine

## 2020-10-29 ENCOUNTER — Ambulatory Visit (INDEPENDENT_AMBULATORY_CARE_PROVIDER_SITE_OTHER): Payer: 59 | Admitting: Family Medicine

## 2020-10-29 ENCOUNTER — Encounter: Payer: Self-pay | Admitting: Family Medicine

## 2020-10-29 ENCOUNTER — Other Ambulatory Visit: Payer: Self-pay

## 2020-10-29 VITALS — BP 122/72 | HR 92 | Temp 98.5°F | Ht 65.0 in | Wt 154.4 lb

## 2020-10-29 DIAGNOSIS — E049 Nontoxic goiter, unspecified: Secondary | ICD-10-CM | POA: Diagnosis not present

## 2020-10-29 DIAGNOSIS — R0989 Other specified symptoms and signs involving the circulatory and respiratory systems: Secondary | ICD-10-CM | POA: Diagnosis not present

## 2020-10-29 DIAGNOSIS — Z0000 Encounter for general adult medical examination without abnormal findings: Secondary | ICD-10-CM

## 2020-10-29 DIAGNOSIS — K219 Gastro-esophageal reflux disease without esophagitis: Secondary | ICD-10-CM | POA: Diagnosis not present

## 2020-10-29 DIAGNOSIS — E782 Mixed hyperlipidemia: Secondary | ICD-10-CM

## 2020-10-29 DIAGNOSIS — Z862 Personal history of diseases of the blood and blood-forming organs and certain disorders involving the immune mechanism: Secondary | ICD-10-CM | POA: Diagnosis not present

## 2020-10-29 DIAGNOSIS — E559 Vitamin D deficiency, unspecified: Secondary | ICD-10-CM

## 2020-10-29 LAB — CBC WITH DIFFERENTIAL/PLATELET
Basophils Absolute: 0 10*3/uL (ref 0.0–0.1)
Basophils Relative: 0.8 % (ref 0.0–3.0)
Eosinophils Absolute: 0.1 10*3/uL (ref 0.0–0.7)
Eosinophils Relative: 3.6 % (ref 0.0–5.0)
HCT: 38.1 % (ref 36.0–46.0)
Hemoglobin: 13.1 g/dL (ref 12.0–15.0)
Lymphocytes Relative: 46.6 % — ABNORMAL HIGH (ref 12.0–46.0)
Lymphs Abs: 1.9 10*3/uL (ref 0.7–4.0)
MCHC: 34.4 g/dL (ref 30.0–36.0)
MCV: 91.6 fl (ref 78.0–100.0)
Monocytes Absolute: 0.3 10*3/uL (ref 0.1–1.0)
Monocytes Relative: 6.9 % (ref 3.0–12.0)
Neutro Abs: 1.7 10*3/uL (ref 1.4–7.7)
Neutrophils Relative %: 42.1 % — ABNORMAL LOW (ref 43.0–77.0)
Platelets: 257 10*3/uL (ref 150.0–400.0)
RBC: 4.16 Mil/uL (ref 3.87–5.11)
RDW: 13.8 % (ref 11.5–15.5)
WBC: 4 10*3/uL (ref 4.0–10.5)

## 2020-10-29 LAB — COMPREHENSIVE METABOLIC PANEL
ALT: 17 U/L (ref 0–35)
AST: 21 U/L (ref 0–37)
Albumin: 5 g/dL (ref 3.5–5.2)
Alkaline Phosphatase: 58 U/L (ref 39–117)
BUN: 8 mg/dL (ref 6–23)
CO2: 28 mEq/L (ref 19–32)
Calcium: 9.8 mg/dL (ref 8.4–10.5)
Chloride: 102 mEq/L (ref 96–112)
Creatinine, Ser: 1.03 mg/dL (ref 0.40–1.20)
GFR: 69.78 mL/min (ref >60.00)
Glucose, Bld: 87 mg/dL (ref 70–99)
Potassium: 4.4 mEq/L (ref 3.5–5.1)
Sodium: 138 mEq/L (ref 135–145)
Total Bilirubin: 0.8 mg/dL (ref 0.2–1.2)
Total Protein: 7.3 g/dL (ref 6.0–8.3)

## 2020-10-29 LAB — IBC PANEL
Iron: 75 ug/dL (ref 42–145)
Saturation Ratios: 14.8 % — ABNORMAL LOW (ref 20.0–50.0)
Transferrin: 362 mg/dL — ABNORMAL HIGH (ref 212.0–360.0)

## 2020-10-29 LAB — LIPID PANEL
Cholesterol: 202 mg/dL — ABNORMAL HIGH (ref 0–200)
HDL: 63.6 mg/dL (ref >39.00)
LDL Cholesterol: 123 mg/dL — ABNORMAL HIGH (ref 0–99)
NonHDL: 137.96
Total CHOL/HDL Ratio: 3
Triglycerides: 73 mg/dL (ref 0.0–149.0)
VLDL: 14.6 mg/dL (ref 0.0–40.0)

## 2020-10-29 LAB — VITAMIN B12: Vitamin B-12: 901 pg/mL (ref 211–911)

## 2020-10-29 LAB — HEMOGLOBIN A1C: Hgb A1c MFr Bld: 5.6 % (ref 4.6–6.5)

## 2020-10-29 LAB — T4, FREE: Free T4: 0.7 ng/dL (ref 0.60–1.60)

## 2020-10-29 LAB — TSH: TSH: 1.3 u[IU]/mL (ref 0.35–4.50)

## 2020-10-29 LAB — VITAMIN D 25 HYDROXY (VIT D DEFICIENCY, FRACTURES): VITD: 39.41 ng/mL (ref 30.00–100.00)

## 2020-10-29 MED ORDER — FLUCONAZOLE 150 MG PO TABS
ORAL_TABLET | ORAL | 2 refills | Status: DC
Start: 2020-10-29 — End: 2020-10-29

## 2020-10-29 MED ORDER — FAMOTIDINE 20 MG PO TABS: 20.0000 mg | ORAL_TABLET | Freq: Two times a day (BID) | ORAL | 3 refills | Status: DC

## 2020-10-29 NOTE — Progress Notes (Signed)
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 Pt working on improving HLD.  Given family hx, started on pravastatin, but noted acid reflux with medication.  Switched to [employer] x1 month then went back to pravastatin and Pepcid.  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  She is spending time with her young son TJ and is hoping to conceive again.  Patient interested in rechecking vitamin D 2/2 history of anemia in the past.  Seen recently by OB/GYN.  Requesting refill on diflucan, takes 1 tab q 3 days prn.  Patient notes family history of HLD.  Patient's maternal uncle with history of CVA in his 43s.  Social History   Socioeconomic History  . Marital status: Married    Spouse name: Not on file  . Number of children: Not on file  . Years of education: Not on file  . Highest education level: Not on file  Occupational History  . Not on file  Tobacco Use  . Smoking status: Never Smoker  . Smokeless tobacco: Never Used  Vaping Use  . Vaping Use: Never used  Substance and Sexual Activity  . Alcohol use: Not Currently    Alcohol/week: 0.0 standard drinks    Comment: rarely  . Drug use: No  . Sexual activity: Yes    Birth control/protection: None  Other Topics Concern  . Not on file  Social History Narrative  . Not on file   Social Determinants of Health   Financial Resource Strain: Not on file  Food Insecurity: Not on file  Transportation Needs: Not on file  Physical Activity: Not on file  Stress: Not on file  Social Connections: Not on file  Intimate Partner Violence: Not on file   Health Maintenance  Topic Date Due  . Hepatitis C Screening  Never done  . PAP SMEAR-Modifier  09/09/2023  . TETANUS/TDAP  08/15/2028  . INFLUENZA VACCINE  Completed  . HIV Screening  Completed  . HPV VACCINES  Aged Out    The following portions of the patient's history were reviewed and updated as appropriate: allergies,  current medications, past family history, past medical history, past social history, past surgical history and problem list.  Review of Systems Pertinent items noted in HPI and remainder of comprehensive ROS otherwise negative.   Objective:    BP 122/72 (BP Location: Left Arm, Patient Position: Sitting, Cuff Size: Normal)   Pulse 92   Temp 98.5 F (36.9 C) (Oral)   Ht 5\' 5"  (1.651 m)   Wt 154 lb 6.4 oz (70 kg)   LMP 10/22/2020   SpO2 99%   BMI 25.69 kg/m  General appearance: alert, cooperative and no distress Head: Normocephalic, without obvious abnormality, atraumatic Eyes: conjunctivae/corneas clear. PERRL, EOM's intact. Fundi benign. Ears: normal TM's and external ear canals both ears Nose: Nares normal. Septum midline. Mucosa normal. No drainage or sinus tenderness. Throat: lips, mucosa, and tongue normal; teeth and gums normal Neck: no adenopathy, no JVD, supple, symmetrical, trachea midline and Mild bilateral carotid bruit.  Thyromegaly without nodule or tenderness Lungs: clear to auscultation bilaterally Heart: regular rate and rhythm, S1, S2 normal, no murmur, click, rub or gallop Abdomen: soft, non-tender; bowel sounds normal; no masses,  no organomegaly Extremities: extremities normal, atraumatic, no cyanosis or edema Pulses: 2+ and symmetric Skin: Skin color, texture, turgor normal. No rashes or lesions Lymph nodes: Cervical, supraclavicular, and axillary nodes normal. Neurologic: Alert  and oriented X 3, normal strength and tone. Normal symmetric reflexes. Normal coordination and gait    Assessment:    Healthy female exam with mild bilateral carotid bruits, goiter.     Plan:     Anticipatory guidance given including wearing seatbelts, smoke detectors in the home, increasing physical activity, increasing p.o. intake of water and vegetables. -We will obtain labs -Pap up-to-date as done by OB/GYN -Given handout -Next CPE in 1 year See After Visit Summary for  Counseling Recommendations   Bilateral carotid bruits -Continue lifestyle modifications and pravastatin - Plan: Lipid panel, US Carotid Duplex Bilateral  Mixed hyperlipidemia -Family history of HLD -Continue pravastatin - Plan: Lipid panel  History of anemia  -stable, no overt or heavy bleeding - Plan: IBC Panel([facility]), CBC with Differential/Platelet, Vitamin B12  Vitamin D deficiency - Plan: Vitamin D, 25-hydroxy  Goiter, non-toxic  - Plan: TSH, T4, Free  Gastroesophageal reflux disease, unspecified whether esophagitis present -Discussed avoiding foods known to cause symptoms -Patient will try taking famotidine nightly instead of twice daily.  We will leave prescription as is for now. -Consider retesting for H. pylori to evaluate for reinfection as previously eradicated -Plan: Vitamin B12  Follow-up as needed  Grier Mitts, MD

## 2020-10-30 IMAGING — US US MFM OB TRANSVAGINAL
1 series · 15 of 26 positions shown · non-contrast
Comparison: none

[Series 1: us mfm ob transvaginal · 26 acquisitions, 15 frames shown]
[im 1/26]
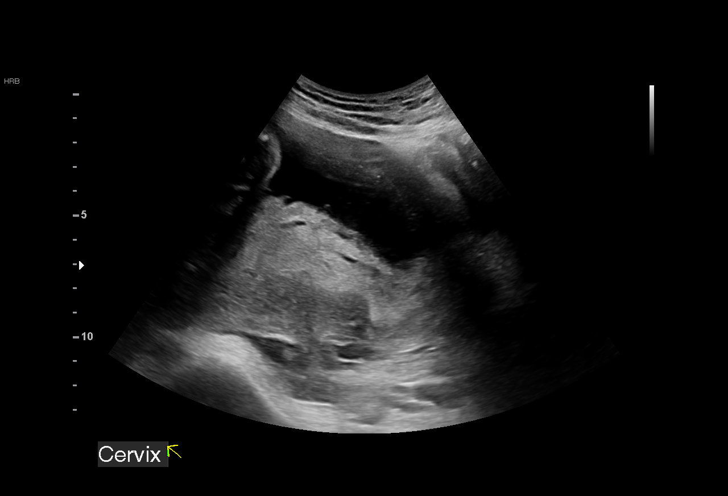
[im 3/26]
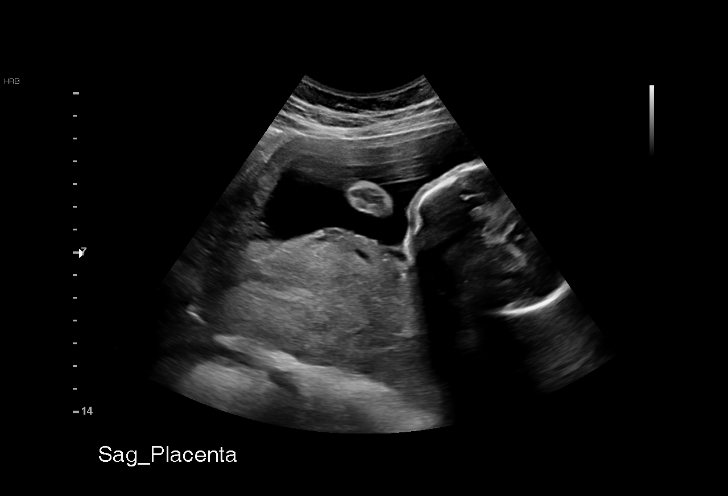
[im 5/26]
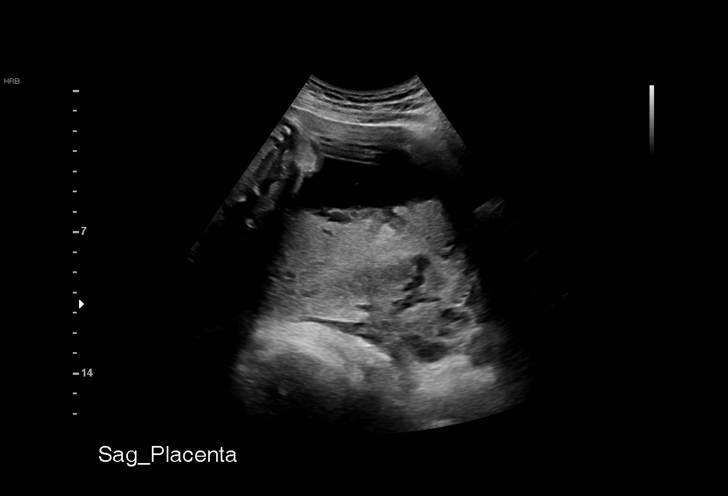
[im 7/26]
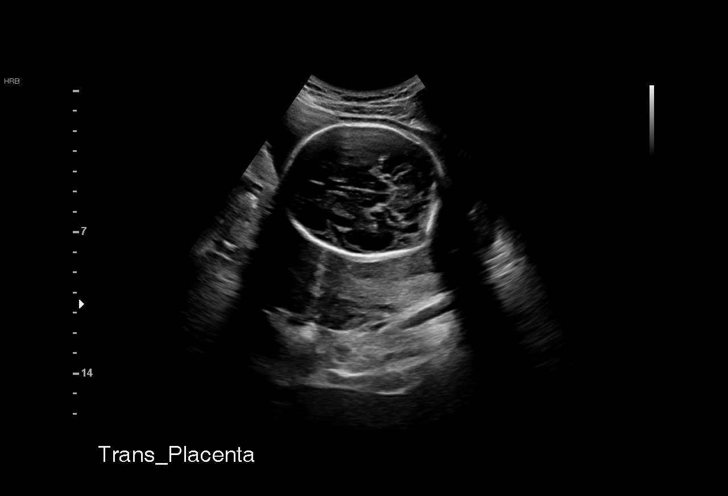
[im 8/26]
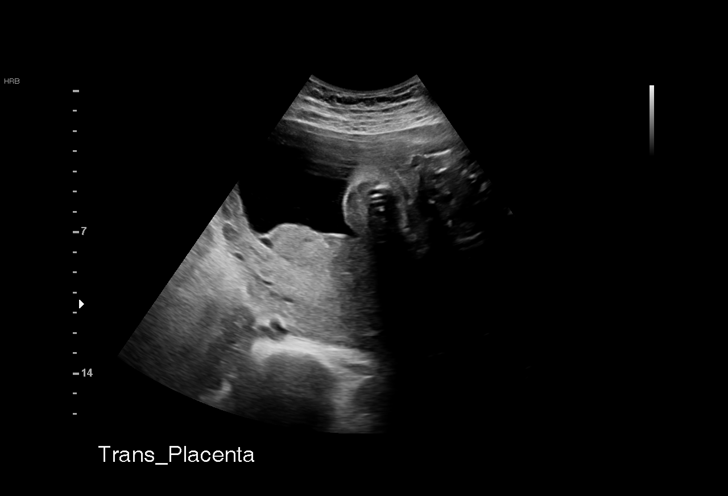
[im 10/26]
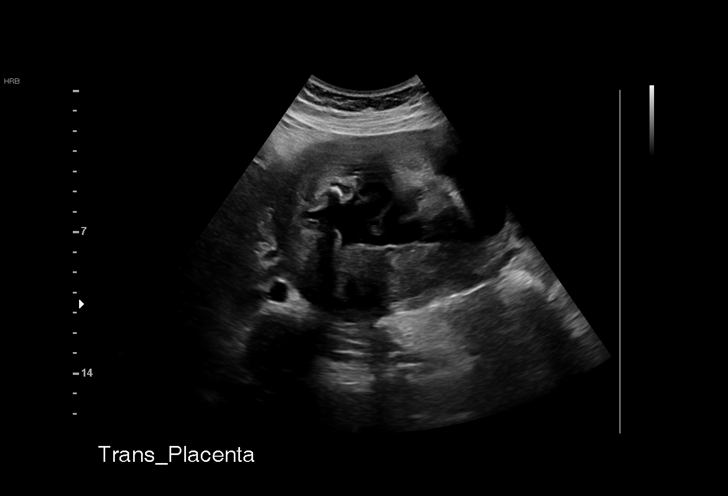
[im 12/26]
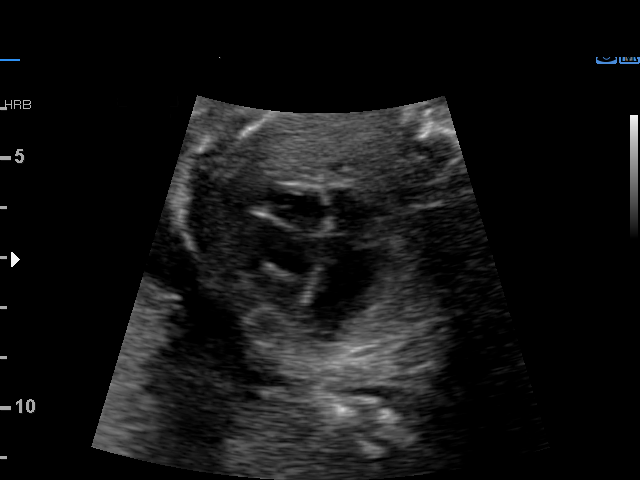
[im 14/26]
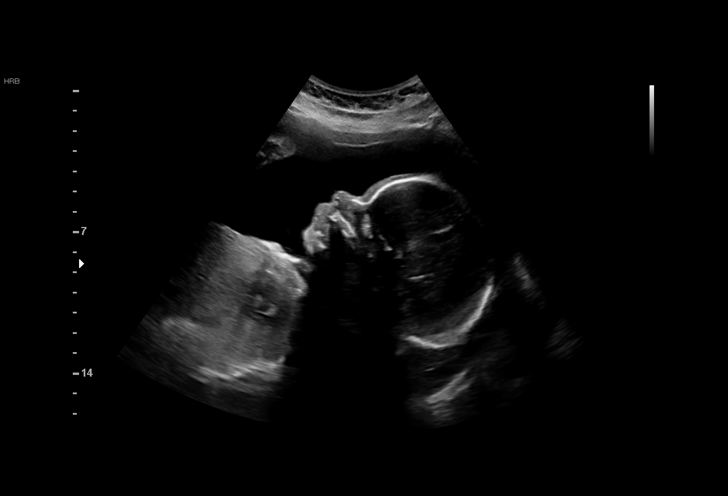
[im 15/26]
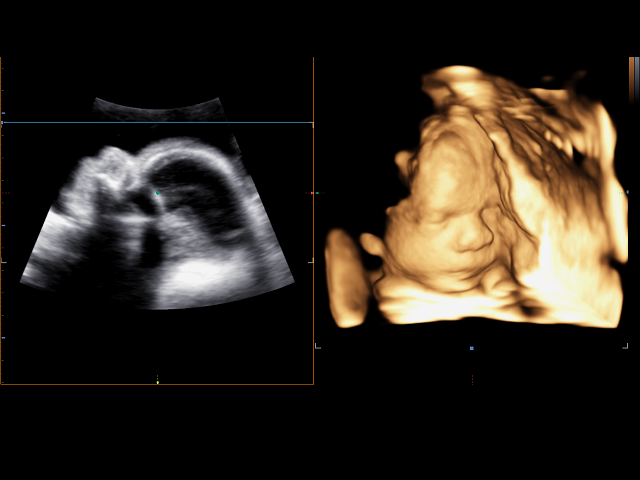
[im 17/26]
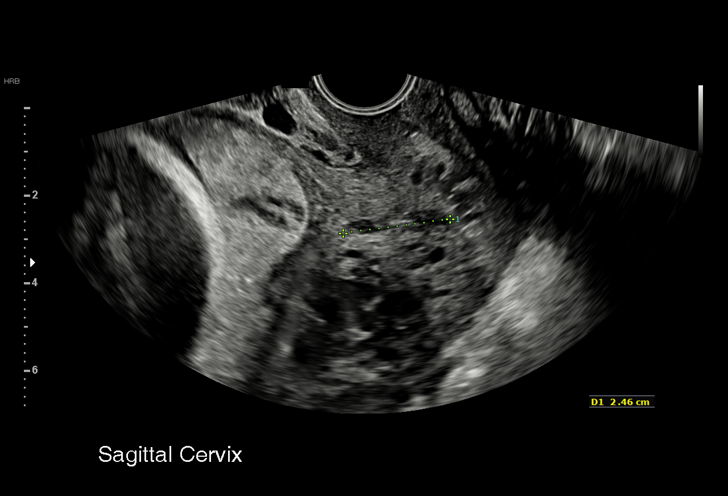
[im 19/26]
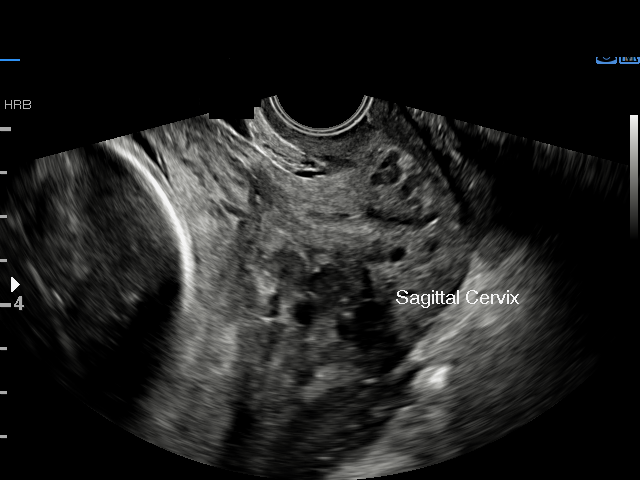
[im 20/26]
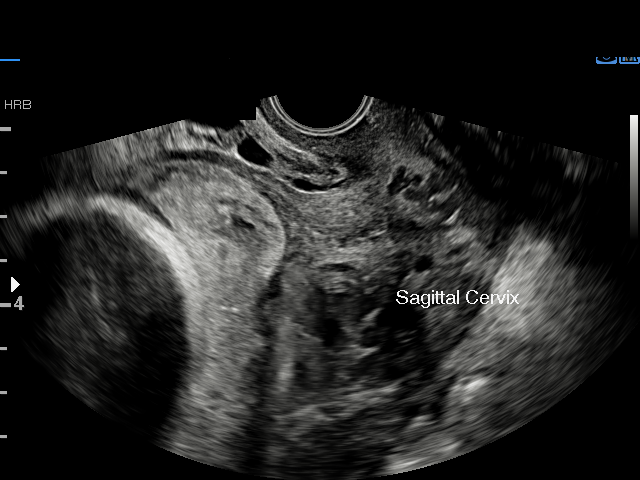
[im 22/26]
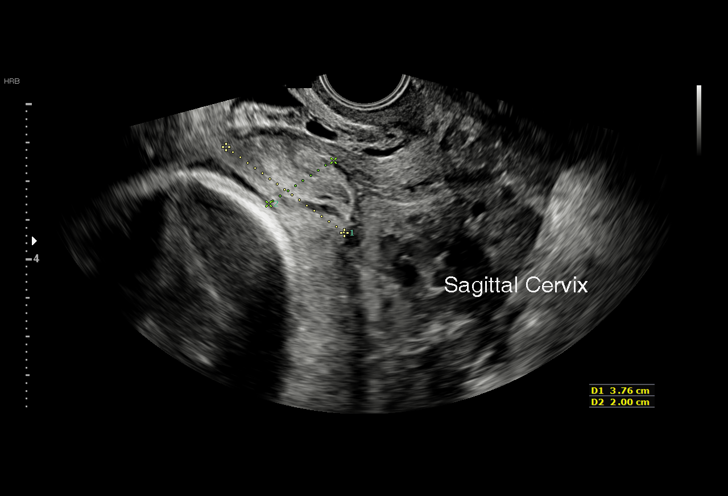
[im 24/26]
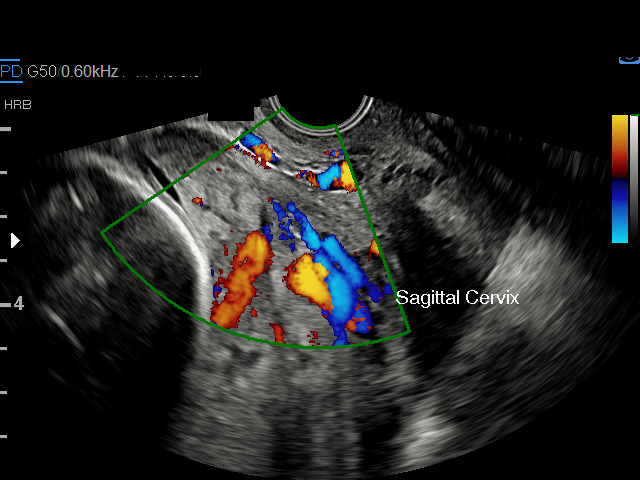
[im 26/26]
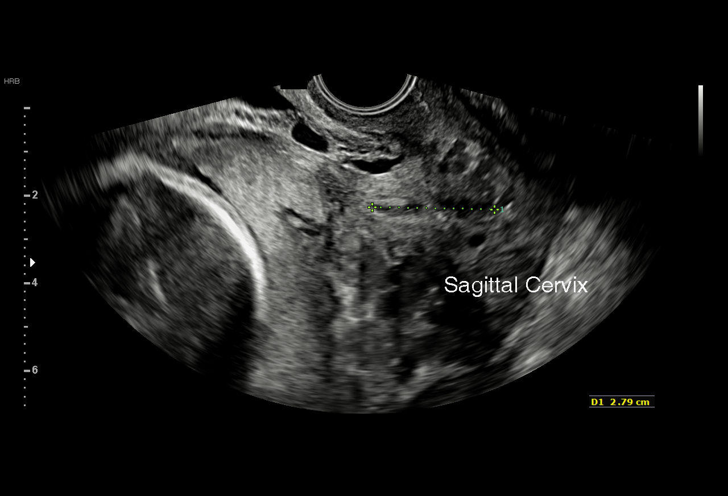

[15 of 26 positions shown; findings below may reference images not displayed]

LORINDA

 Attending:        Ngawak Ikhsan        Address:          [REDACTED]
 Referred By:      Hugo Esteban Nettle               Tertiary Phy.:    [facility]-
                                                            MAU/Triage
                   [id number]

  1  US MFM OB TRANSVAGINAL               768[address].2      HUNBERTO
 ----------------------------------------------------------------------

 ----------------------------------------------------------------------
Indications

  Uterine fibroids affecting pregnancy in        O34.12,
  second trimester, antepartum
  Hypertension - Chronic/Pre-existing
  Pregnancy resulting from assisted
  reproductive technology
  Deep vein thrombosis (DVT) on Lovenox
  Fetal arrhythmia affecting pregnancy,
  antepartum (PACs)
  26 weeks gestation of pregnancy
  Vaginal bleeding in pregnancy, second
  trimester
  Placenta previa with hemorrhage, second
  trimester
 ----------------------------------------------------------------------
Vital Signs

 BMI:
Fetal Evaluation

 Num Of Fetuses:         1
 Fetal Heart Rate(bpm):  145
 Cardiac Activity:       Observed
 Presentation:           Cephalic
 Placenta:               Posterior Previa
 Amniotic Fluid
 AFI FV:      Within normal limits

                             Largest Pocket(cm)


 Comment:    ***** Possible Hematoma visualized at internal cervical os
             measuring 3.7cm *******
OB History

 Gravidity:    1
Gestational Age

 Best:          26w 4d     Det. By:  D.O. Conception          EDD:   11/10/18
                                     (02/17/18)
Cervix Uterus Adnexa

 Cervix
 Length:            2.6  cm.
 Measured transvaginally.
Impression

 Patient with placenta previa was admitted 6 days ago. She is
 here for evaluation of cervical length that could not be clearly
 assessed on her previous scan. Patient does not have
 uterine contractions now.
 Amniotic fluid is normal and good fetal activity is seen. On
 transvaginal scan, the cervix is seen well and measures
 cm. Placenta previa is seen. At the anterior edge of placenta,
 hematoma (blood clot) is seen.
Recommendations

 Fetal growth assessment in 2 to 3 weeks.
                      DLEKE TIGER

## 2020-11-03 ENCOUNTER — Ambulatory Visit
Admission: RE | Admit: 2020-11-03 | Discharge: 2020-11-03 | Disposition: A | Discharge status: Home / Self Care | Payer: 59 | Source: Ambulatory Visit | Attending: Family Medicine | Admitting: Family Medicine

## 2020-11-03 DIAGNOSIS — I6523 Occlusion and stenosis of bilateral carotid arteries: Secondary | ICD-10-CM | POA: Diagnosis not present

## 2020-11-03 DIAGNOSIS — R0989 Other specified symptoms and signs involving the circulatory and respiratory systems: Secondary | ICD-10-CM

## 2020-11-03 DIAGNOSIS — E042 Nontoxic multinodular goiter: Secondary | ICD-10-CM | POA: Diagnosis not present

## 2020-11-03 DIAGNOSIS — E049 Nontoxic goiter, unspecified: Secondary | ICD-10-CM

## 2020-11-04 ENCOUNTER — Encounter: Payer: Self-pay | Admitting: Family Medicine

## 2020-11-05 NOTE — Progress Notes (Signed)
Results viewed on MyChart. 

## 2020-11-07 HISTORY — PX: DIAGNOSTIC LAPAROSCOPY: SUR761

## 2020-11-14 DIAGNOSIS — Z319 Encounter for procreative management, unspecified: Secondary | ICD-10-CM | POA: Diagnosis not present

## 2020-11-21 ENCOUNTER — Encounter (HOSPITAL_BASED_OUTPATIENT_CLINIC_OR_DEPARTMENT_OTHER): Payer: Self-pay

## 2020-11-21 ENCOUNTER — Other Ambulatory Visit (HOSPITAL_COMMUNITY): Payer: Self-pay

## 2020-11-21 ENCOUNTER — Ambulatory Visit (HOSPITAL_BASED_OUTPATIENT_CLINIC_OR_DEPARTMENT_OTHER): Admit: 2020-11-21 | Payer: 59 | Admitting: Obstetrics and Gynecology

## 2020-11-21 DIAGNOSIS — N805 Endometriosis of intestine: Secondary | ICD-10-CM | POA: Diagnosis not present

## 2020-11-21 DIAGNOSIS — N883 Incompetence of cervix uteri: Secondary | ICD-10-CM | POA: Diagnosis not present

## 2020-11-21 DIAGNOSIS — N801 Endometriosis of ovary: Secondary | ICD-10-CM | POA: Diagnosis not present

## 2020-11-21 DIAGNOSIS — N802 Endometriosis of fallopian tube: Secondary | ICD-10-CM | POA: Diagnosis not present

## 2020-11-21 DIAGNOSIS — N946 Dysmenorrhea, unspecified: Secondary | ICD-10-CM | POA: Diagnosis not present

## 2020-11-21 DIAGNOSIS — N736 Female pelvic peritoneal adhesions (postinfective): Secondary | ICD-10-CM | POA: Diagnosis not present

## 2020-11-21 DIAGNOSIS — N800 Endometriosis of uterus: Secondary | ICD-10-CM | POA: Diagnosis not present

## 2020-11-21 DIAGNOSIS — N979 Female infertility, unspecified: Secondary | ICD-10-CM | POA: Diagnosis not present

## 2020-11-21 DIAGNOSIS — D252 Subserosal leiomyoma of uterus: Secondary | ICD-10-CM | POA: Diagnosis not present

## 2020-11-21 DIAGNOSIS — L905 Scar conditions and fibrosis of skin: Secondary | ICD-10-CM | POA: Diagnosis not present

## 2020-11-21 DIAGNOSIS — K219 Gastro-esophageal reflux disease without esophagitis: Secondary | ICD-10-CM | POA: Diagnosis not present

## 2020-11-21 DIAGNOSIS — N808 Other endometriosis: Secondary | ICD-10-CM | POA: Diagnosis not present

## 2020-11-21 DIAGNOSIS — N803 Endometriosis of pelvic peritoneum: Secondary | ICD-10-CM | POA: Diagnosis not present

## 2020-11-21 SURGERY — LYSIS, ADHESIONS, LAPAROSCOPIC
Anesthesia: General

## 2020-11-21 MED ORDER — NORETHINDRONE ACETATE 5 MG PO TABS
ORAL_TABLET | ORAL | 0 refills | Status: DC
Start: 2020-11-21 — End: 2020-12-05
  Filled 2020-11-21: qty 15, 15d supply, fill #0

## 2020-11-21 MED ORDER — ONDANSETRON HCL 4 MG PO TABS
ORAL_TABLET | ORAL | 1 refills | Status: DC
  Filled 2020-11-21: qty 30, 30d supply, fill #0

## 2020-11-21 MED ORDER — OXYCODONE-ACETAMINOPHEN 7.5-325 MG PO TABS
ORAL_TABLET | ORAL | 0 refills | Status: DC
  Filled 2020-11-21: qty 20, 5d supply, fill #0

## 2020-11-22 ENCOUNTER — Other Ambulatory Visit (HOSPITAL_COMMUNITY): Payer: Self-pay

## 2020-11-25 ENCOUNTER — Other Ambulatory Visit (HOSPITAL_COMMUNITY): Payer: Self-pay

## 2020-11-25 MED ORDER — LETROZOLE 2.5 MG PO TABS
2.5000 mg | ORAL_TABLET | Freq: Every day | ORAL | 3 refills | Status: DC
  Filled 2020-11-25 – 2020-12-08 (×2): qty 30, 30d supply, fill #0
  Filled 2021-01-01: qty 30, 30d supply, fill #1
  Filled 2021-02-24: qty 30, 30d supply, fill #2

## 2020-11-25 MED ORDER — LUPRON DEPOT (1-MONTH) 3.75 MG IM KIT
PACK | INTRAMUSCULAR | 1 refills | Status: DC
  Filled 2020-11-25: qty 1, 30d supply, fill #0
  Filled 2021-01-01: qty 1, 30d supply, fill #1

## 2020-11-27 ENCOUNTER — Other Ambulatory Visit (HOSPITAL_COMMUNITY): Payer: Self-pay

## 2020-12-02 ENCOUNTER — Other Ambulatory Visit (HOSPITAL_COMMUNITY): Payer: Self-pay

## 2020-12-03 ENCOUNTER — Other Ambulatory Visit (HOSPITAL_COMMUNITY): Payer: Self-pay

## 2020-12-05 ENCOUNTER — Other Ambulatory Visit (HOSPITAL_COMMUNITY): Payer: Self-pay

## 2020-12-05 MED ORDER — NORETHINDRONE ACETATE 5 MG PO TABS
ORAL_TABLET | Freq: Every day | ORAL | 1 refills | Status: DC
  Filled 2020-12-06: qty 10, 10d supply, fill #0
  Filled 2020-12-06: qty 5, 5d supply, fill #0

## 2020-12-06 ENCOUNTER — Other Ambulatory Visit (HOSPITAL_COMMUNITY): Payer: Self-pay

## 2020-12-06 MED FILL — Rosuvastatin Calcium Tab 5 MG: ORAL | 30 days supply | Qty: 30 | Fill #0 | Status: AC

## 2020-12-08 ENCOUNTER — Other Ambulatory Visit (HOSPITAL_COMMUNITY): Payer: Self-pay

## 2020-12-09 ENCOUNTER — Other Ambulatory Visit (HOSPITAL_COMMUNITY): Payer: Self-pay

## 2020-12-09 MED ORDER — XARELTO 10 MG PO TABS
ORAL_TABLET | Freq: Every day | ORAL | 1 refills | Status: DC
  Filled 2020-12-09: qty 30, 30d supply, fill #0
  Filled 2021-01-01: qty 30, 30d supply, fill #1

## 2020-12-10 ENCOUNTER — Other Ambulatory Visit (HOSPITAL_COMMUNITY): Payer: Self-pay

## 2020-12-24 ENCOUNTER — Other Ambulatory Visit (HOSPITAL_COMMUNITY): Payer: Self-pay

## 2020-12-24 MED FILL — Fluconazole Tab 150 MG: ORAL | 9 days supply | Qty: 3 | Fill #0 | Status: AC

## 2021-01-01 ENCOUNTER — Other Ambulatory Visit (HOSPITAL_COMMUNITY): Payer: Self-pay

## 2021-01-01 MED FILL — Rosuvastatin Calcium Tab 5 MG: ORAL | 30 days supply | Qty: 30 | Fill #1 | Status: AC

## 2021-01-02 ENCOUNTER — Other Ambulatory Visit (HOSPITAL_COMMUNITY): Payer: Self-pay

## 2021-01-06 ENCOUNTER — Other Ambulatory Visit (HOSPITAL_COMMUNITY): Payer: Self-pay

## 2021-01-07 ENCOUNTER — Other Ambulatory Visit (HOSPITAL_COMMUNITY): Payer: Self-pay

## 2021-01-07 ENCOUNTER — Encounter: Payer: Self-pay | Admitting: Family Medicine

## 2021-01-07 ENCOUNTER — Other Ambulatory Visit: Payer: Self-pay | Admitting: Family Medicine

## 2021-01-07 ENCOUNTER — Other Ambulatory Visit: Payer: Self-pay

## 2021-01-07 DIAGNOSIS — E782 Mixed hyperlipidemia: Secondary | ICD-10-CM

## 2021-01-07 MED ORDER — ROSUVASTATIN CALCIUM 5 MG PO TABS
ORAL_TABLET | Freq: Every day | ORAL | 3 refills | Status: DC
  Filled 2021-01-07: qty 30, 30d supply, fill #0
  Filled 2021-02-04: qty 90, 90d supply, fill #0

## 2021-01-08 ENCOUNTER — Telehealth (INDEPENDENT_AMBULATORY_CARE_PROVIDER_SITE_OTHER): Payer: 59 | Admitting: Family Medicine

## 2021-01-08 ENCOUNTER — Encounter: Payer: Self-pay | Admitting: Family Medicine

## 2021-01-08 DIAGNOSIS — N809 Endometriosis, unspecified: Secondary | ICD-10-CM

## 2021-01-08 DIAGNOSIS — N898 Other specified noninflammatory disorders of vagina: Secondary | ICD-10-CM | POA: Diagnosis not present

## 2021-01-08 DIAGNOSIS — R300 Dysuria: Secondary | ICD-10-CM | POA: Diagnosis not present

## 2021-01-08 HISTORY — DX: Endometriosis, unspecified: N80.9

## 2021-01-08 LAB — POCT URINALYSIS DIPSTICK
Bilirubin, UA: NEGATIVE
Blood, UA: NEGATIVE
Glucose, UA: NEGATIVE
Ketones, UA: NEGATIVE
Leukocytes, UA: NEGATIVE
Nitrite, UA: NEGATIVE
Protein, UA: NEGATIVE
Spec Grav, UA: 1.025 (ref 1.010–1.025)
Urobilinogen, UA: NEGATIVE E.U./dL — AB
pH, UA: 6 (ref 5.0–8.0)

## 2021-01-08 NOTE — Progress Notes (Signed)
Virtual Visit via Video Note  I connected with Melissa Ruiz "Jennette Banker, MD on 01/08/21 at  2:00 PM EDT by a video enabled telemedicine application 2/2 [id number] pandemic and verified that I am speaking with the correct person using two identifiers.  Location patient: home Location provider:work or home office Persons participating in the virtual visit: patient, provider  I discussed the limitations of evaluation and management by telemedicine and the availability of in person appointments. The patient expressed understanding and agreed to proceed.   HPI: Pt with acute concern for possible UTI.  Pt notes burning/irritation with urination x a few days.  Pt worried about UTI as she just returned from a trip and is scheduled to leave again tomorrow.  Endorses vaginal dryness.  Pt denies urinary frequency, back pain, n/v, fever, chills.  Pt mentions recent dx of stage IV endometriosis via ex lap with lysis of adhesions requiring removal of R salpingectomy.  Pt currently on lupron and letrozole which make her feel menopausal.  Hoping to try a 3rd round of egg retrieval.  ROS: See pertinent positives and negatives per HPI.  Past Medical History:  Diagnosis Date  . Anemia   . DVT (deep venous thrombosis) ([place]) 2017  . Essential hypertension 09/16/2014  . Hyperlipidemia   . Pregnancy resulting from in vitro fertilization, antepartum 04/12/2018   Patient of Dr. Kerin Perna.  EDD 11/10/2018 by IVF dating.  _0  fetal echo: normal except known PACs  . Pulmonary embolism ([place]) 09/2015    Past Surgical History:  Procedure Laterality Date  . CESAREAN SECTION N/A 08/27/2018   Procedure: CESAREAN SECTION;  Surgeon: Woodroe Mode, MD;  Location: [place];  Service: Obstetrics;  Laterality: N/A;  . CHOLECYSTECTOMY      Family History  Problem Relation Age of Onset  . Hypertension Mother   . Hypertension Father   . Diabetes Father   . Cancer Father      Current Outpatient Medications:   .  enoxaparin (LOVENOX) 40 MG/0.4ML injection, INJECT 40MG UNDER THE SKIN DAILY AS DIRECTED, Disp: 12 mL, Rfl: 6 .  estradiol (ESTRACE) 2 MG tablet, TAKE 1 TABLET BY MOUTH TWICE DAILY, Disp: 60 tablet, Rfl: 3 .  famotidine (PEPCID) 20 MG tablet, TAKE 1 TABLET BY MOUTH 2 TIMES DAILY, Disp: 180 tablet, Rfl: 3 .  Ferrous Gluconate-C-Folic Acid (IRON-C PO), Take by mouth., Disp: , Rfl:  .  filgrastim-sndz (ZARXIO) 300 MCG/0.5ML SOSY injection, INJECT 300 MCG UNDER THE SKIN 1 HOUR BEFORE TRANSFER, Disp: .5 mL, Rfl: 1 .  fluconazole (DIFLUCAN) 150 MG tablet, USE AS DIRECTED, Disp: 15 tablet, Rfl: 2 .  fluconazole (DIFLUCAN) 150 MG tablet, TAKE 1 TABLET BY MOUTH AS A SINGLE DOSE. REPEAT IN 24 HOURS AS NECESSARY., Disp: 1 tablet, Rfl: 2 .  letrozole (FEMARA) 2.5 MG tablet, Take 1 tablet (2.5 mg total) by mouth daily., Disp: 30 tablet, Rfl: 3 .  leuprolide (LUPRON DEPOT, 15-MONTH,) 3.75 MG injection, Reconstitute and inject into the muscle once a month for 2 months., Disp: 1 kit, Rfl: 1 .  methylPREDNISolone (MEDROL) 4 MG tablet, TAKE 2 TABLETS (8MG) BY MOUTH TWICE DAILY, Disp: 16 tablet, Rfl: 0 .  norethindrone (AYGESTIN) 5 MG tablet, Take 1 tablet by mouth daily, Disp: 15 tablet, Rfl: 1 .  omeprazole (PRILOSEC) 20 MG capsule, TAKE 1 CAPSULE BY MOUTH 2 TIMES DAILY, Disp: 60 capsule, Rfl: 1 .  ondansetron (ZOFRAN) 4 MG tablet, Take one tablet (4 mg dose) by mouth daily as needed for Nausea.,  Disp: 30 tablet, Rfl: 1 .  oxyCODONE-acetaminophen (PERCOCET) 7.5-325 MG tablet, Take 1 tablet by mouth every 6 hours as needed, Disp: 20 tablet, Rfl: 0 .  progesterone 50 MG/ML injection, INJECT 1 ML INTO THE MUSCLE DAILY, Disp: 30 mL, Rfl: 2 .  rivaroxaban (XARELTO) 10 MG TABS tablet, Take 1 tablet by mouth daily, Disp: 30 tablet, Rfl: 1 .  rosuvastatin (CRESTOR) 5 MG tablet, TAKE 1 TABLET BY MOUTH DAILY., Disp: 30 tablet, Rfl: 3 .  VITAMIN D PO, Take by mouth., Disp: , Rfl:   EXAM:  VITALS per patient if  applicable:  RR between 12-20 bpm  GENERAL: alert, oriented, appears well and in no acute distress  HEENT: atraumatic, conjunctiva clear, no obvious abnormalities on inspection of external nose and ears  NECK: normal movements of the head and neck  LUNGS: on inspection no signs of respiratory distress, breathing rate appears normal, no obvious gross SOB, gasping or wheezing  CV: no obvious cyanosis  MS: moves all visible extremities without noticeable abnormality  PSYCH/NEURO: pleasant and cooperative, no obvious depression or anxiety, speech and thought processing grossly intact  ASSESSMENT AND PLAN:  Discussed the following assessment and plan:  Burning with urination  -UA negative.  SG 1.025 -symptoms likely 2/2 vaginal dryness from medications -increase po intake of water and fluids. - Plan: POCT urinalysis dipstick  Vaginal dryness -2/2 medications: lupron and letrozole -can use water based lubricant if needed for discomfort -continue f/u with OB/Gyn  Endometriosis determined by laparoscopy -Continue follow-up with OB/GYN infertility specialist  F/u prn   I discussed the assessment and treatment plan with the patient. The patient was provided an opportunity to ask questions and all were answered. The patient agreed with the plan and demonstrated an understanding of the instructions.   The patient was advised to call back or seek an in-person evaluation if the symptoms worsen or if the condition fails to improve as anticipated.  Billie Ruddy, MD

## 2021-01-08 NOTE — Telephone Encounter (Signed)
Spoke with patient, came in to give sample. VV scheduled.

## 2021-01-26 ENCOUNTER — Other Ambulatory Visit (HOSPITAL_COMMUNITY): Payer: Self-pay

## 2021-01-26 DIAGNOSIS — N8500 Endometrial hyperplasia, unspecified: Secondary | ICD-10-CM | POA: Diagnosis not present

## 2021-01-26 DIAGNOSIS — Z3141 Encounter for fertility testing: Secondary | ICD-10-CM | POA: Diagnosis not present

## 2021-01-26 MED ORDER — DOXYCYCLINE HYCLATE 100 MG PO CAPS
ORAL_CAPSULE | ORAL | 0 refills | Status: DC
  Filled 2021-01-26: qty 10, 5d supply, fill #0

## 2021-01-26 MED FILL — Fluconazole Tab 150 MG: ORAL | 9 days supply | Qty: 3 | Fill #1 | Status: AC

## 2021-02-04 ENCOUNTER — Other Ambulatory Visit (HOSPITAL_COMMUNITY): Payer: Self-pay

## 2021-02-04 MED ORDER — ESTRADIOL 2 MG PO TABS
ORAL_TABLET | Freq: Two times a day (BID) | ORAL | 3 refills | Status: DC
  Filled 2021-02-04: qty 60, 30d supply, fill #0
  Filled 2021-03-20: qty 60, 30d supply, fill #1

## 2021-02-04 MED ORDER — "BD HYPODERMIC NEEDLE 22G X 1-1/2"" MISC"
2 refills | Status: DC
  Filled 2021-02-04 – 2021-03-20 (×2): qty 30, 30d supply, fill #0

## 2021-02-04 MED ORDER — ESTRADIOL 0.1 MG/24HR TD PTTW
1.0000 | MEDICATED_PATCH | TRANSDERMAL | 3 refills | Status: DC
  Filled 2021-02-04: qty 8, 28d supply, fill #0
  Filled 2021-03-06: qty 8, 28d supply, fill #1
  Filled 2021-04-01: qty 8, 28d supply, fill #2

## 2021-02-04 MED ORDER — ENOXAPARIN SODIUM 40 MG/0.4ML IJ SOSY
PREFILLED_SYRINGE | INTRAMUSCULAR | 6 refills | Status: DC
  Filled 2021-02-04: qty 12, 30d supply, fill #0
  Filled 2021-03-20: qty 12, 30d supply, fill #1
  Filled 2021-04-20: qty 12, 30d supply, fill #2

## 2021-02-04 MED ORDER — VITAMIN E 268 MG (400 UNIT) PO CAPS: ORAL_CAPSULE | Freq: Every day | ORAL | 6 refills | Status: DC

## 2021-02-04 MED ORDER — METHYLPREDNISOLONE 8 MG PO TABS
ORAL_TABLET | Freq: Two times a day (BID) | ORAL | 0 refills | Status: DC
  Filled 2021-02-04: qty 8, 4d supply, fill #0

## 2021-02-04 MED ORDER — PENTOXIFYLLINE ER 400 MG PO TBCR
EXTENDED_RELEASE_TABLET | ORAL | 1 refills | Status: DC
  Filled 2021-02-04: qty 90, 30d supply, fill #0

## 2021-02-04 MED ORDER — PROGESTERONE 50 MG/ML IM OIL
TOPICAL_OIL | INTRAMUSCULAR | 2 refills | Status: DC
  Filled 2021-02-04: qty 30, 30d supply, fill #0

## 2021-02-04 MED ORDER — "BD LUER-LOK SYRINGE 18G X 1-1/2"" 3 ML MISC"
2 refills | Status: DC
  Filled 2021-02-04 – 2021-03-20 (×2): qty 30, 30d supply, fill #0

## 2021-02-04 MED FILL — Famotidine Tab 20 MG: ORAL | 90 days supply | Qty: 180 | Fill #0 | Status: AC

## 2021-02-05 ENCOUNTER — Other Ambulatory Visit (HOSPITAL_COMMUNITY): Payer: Self-pay

## 2021-02-13 ENCOUNTER — Other Ambulatory Visit (HOSPITAL_COMMUNITY): Payer: Self-pay

## 2021-02-23 ENCOUNTER — Other Ambulatory Visit (HOSPITAL_COMMUNITY): Payer: Self-pay

## 2021-02-23 DIAGNOSIS — Z3183 Encounter for assisted reproductive fertility procedure cycle: Secondary | ICD-10-CM | POA: Diagnosis not present

## 2021-02-23 DIAGNOSIS — Z113 Encounter for screening for infections with a predominantly sexual mode of transmission: Secondary | ICD-10-CM | POA: Diagnosis not present

## 2021-02-23 DIAGNOSIS — Z3200 Encounter for pregnancy test, result unknown: Secondary | ICD-10-CM | POA: Diagnosis not present

## 2021-02-23 DIAGNOSIS — N979 Female infertility, unspecified: Secondary | ICD-10-CM | POA: Diagnosis not present

## 2021-02-23 DIAGNOSIS — Z3201 Encounter for pregnancy test, result positive: Secondary | ICD-10-CM | POA: Diagnosis not present

## 2021-02-23 MED ORDER — FLUCONAZOLE 150 MG PO TABS
ORAL_TABLET | ORAL | 2 refills | Status: DC
  Filled 2021-02-23: qty 1, 7d supply, fill #0

## 2021-02-23 MED ORDER — FLUCONAZOLE 150 MG PO TABS
ORAL_TABLET | ORAL | 2 refills | Status: DC
  Filled 2021-02-23: qty 1, 1d supply, fill #0
  Filled 2021-02-24: qty 3, 3d supply, fill #0

## 2021-02-24 ENCOUNTER — Other Ambulatory Visit (HOSPITAL_COMMUNITY): Payer: Self-pay

## 2021-02-26 ENCOUNTER — Other Ambulatory Visit (HOSPITAL_COMMUNITY): Payer: Self-pay

## 2021-02-26 MED ORDER — NEUPOGEN 300 MCG/0.5ML IJ SOSY
PREFILLED_SYRINGE | INTRAMUSCULAR | 0 refills | Status: DC
  Filled 2021-03-05: qty 0.5, 1d supply, fill #0

## 2021-03-04 DIAGNOSIS — Z113 Encounter for screening for infections with a predominantly sexual mode of transmission: Secondary | ICD-10-CM | POA: Diagnosis not present

## 2021-03-04 DIAGNOSIS — Z3201 Encounter for pregnancy test, result positive: Secondary | ICD-10-CM | POA: Diagnosis not present

## 2021-03-04 DIAGNOSIS — Z3200 Encounter for pregnancy test, result unknown: Secondary | ICD-10-CM | POA: Diagnosis not present

## 2021-03-04 DIAGNOSIS — Z3183 Encounter for assisted reproductive fertility procedure cycle: Secondary | ICD-10-CM | POA: Diagnosis not present

## 2021-03-04 DIAGNOSIS — N979 Female infertility, unspecified: Secondary | ICD-10-CM | POA: Diagnosis not present

## 2021-03-05 ENCOUNTER — Other Ambulatory Visit (HOSPITAL_COMMUNITY): Payer: Self-pay

## 2021-03-06 ENCOUNTER — Other Ambulatory Visit (HOSPITAL_COMMUNITY): Payer: Self-pay

## 2021-03-09 DIAGNOSIS — N979 Female infertility, unspecified: Secondary | ICD-10-CM | POA: Diagnosis not present

## 2021-03-09 DIAGNOSIS — Z3183 Encounter for assisted reproductive fertility procedure cycle: Secondary | ICD-10-CM | POA: Diagnosis not present

## 2021-03-17 DIAGNOSIS — Z3201 Encounter for pregnancy test, result positive: Secondary | ICD-10-CM | POA: Diagnosis not present

## 2021-03-17 DIAGNOSIS — Z3200 Encounter for pregnancy test, result unknown: Secondary | ICD-10-CM | POA: Diagnosis not present

## 2021-03-17 DIAGNOSIS — Z113 Encounter for screening for infections with a predominantly sexual mode of transmission: Secondary | ICD-10-CM | POA: Diagnosis not present

## 2021-03-17 DIAGNOSIS — Z3183 Encounter for assisted reproductive fertility procedure cycle: Secondary | ICD-10-CM | POA: Diagnosis not present

## 2021-03-19 DIAGNOSIS — Z3200 Encounter for pregnancy test, result unknown: Secondary | ICD-10-CM | POA: Diagnosis not present

## 2021-03-19 DIAGNOSIS — Z3201 Encounter for pregnancy test, result positive: Secondary | ICD-10-CM | POA: Diagnosis not present

## 2021-03-20 ENCOUNTER — Other Ambulatory Visit (HOSPITAL_COMMUNITY): Payer: Self-pay

## 2021-03-30 DIAGNOSIS — Z3200 Encounter for pregnancy test, result unknown: Secondary | ICD-10-CM | POA: Diagnosis not present

## 2021-04-01 ENCOUNTER — Other Ambulatory Visit (HOSPITAL_COMMUNITY): Payer: Self-pay

## 2021-04-14 ENCOUNTER — Other Ambulatory Visit (HOSPITAL_COMMUNITY): Payer: Self-pay

## 2021-04-14 MED ORDER — ENDOMETRIN 100 MG VA INST
VAGINAL_INSERT | VAGINAL | 1 refills | Status: DC
  Filled 2021-04-14: qty 84, 28d supply, fill #0

## 2021-04-15 ENCOUNTER — Other Ambulatory Visit (HOSPITAL_COMMUNITY): Payer: Self-pay

## 2021-04-15 DIAGNOSIS — O0900 Supervision of pregnancy with history of infertility, unspecified trimester: Secondary | ICD-10-CM | POA: Diagnosis not present

## 2021-04-15 MED ORDER — CRINONE 8 % VA GEL
VAGINAL | 1 refills | Status: DC
  Filled 2021-04-15: qty 67.5, 30d supply, fill #0

## 2021-04-16 ENCOUNTER — Other Ambulatory Visit (HOSPITAL_COMMUNITY): Payer: Self-pay

## 2021-04-16 MED ORDER — CRINONE 8 % VA GEL
VAGINAL | 1 refills | Status: DC
  Filled 2021-04-16: qty 33.75, 15d supply, fill #0

## 2021-04-20 ENCOUNTER — Other Ambulatory Visit (HOSPITAL_COMMUNITY): Payer: Self-pay

## 2021-04-20 MED FILL — Famotidine Tab 20 MG: ORAL | 90 days supply | Qty: 180 | Fill #1 | Status: AC

## 2021-05-14 ENCOUNTER — Other Ambulatory Visit: Payer: Self-pay

## 2021-05-14 ENCOUNTER — Ambulatory Visit (INDEPENDENT_AMBULATORY_CARE_PROVIDER_SITE_OTHER): Payer: 59 | Admitting: Obstetrics & Gynecology

## 2021-05-14 ENCOUNTER — Encounter: Payer: Self-pay | Admitting: Obstetrics & Gynecology

## 2021-05-14 ENCOUNTER — Other Ambulatory Visit (HOSPITAL_COMMUNITY): Payer: Self-pay

## 2021-05-14 VITALS — BP 138/85 | HR 99 | Wt 162.0 lb

## 2021-05-14 DIAGNOSIS — Z86711 Personal history of pulmonary embolism: Secondary | ICD-10-CM

## 2021-05-14 DIAGNOSIS — O09529 Supervision of elderly multigravida, unspecified trimester: Secondary | ICD-10-CM | POA: Insufficient documentation

## 2021-05-14 DIAGNOSIS — Z8639 Personal history of other endocrine, nutritional and metabolic disease: Secondary | ICD-10-CM | POA: Diagnosis not present

## 2021-05-14 DIAGNOSIS — Z8759 Personal history of other complications of pregnancy, childbirth and the puerperium: Secondary | ICD-10-CM | POA: Insufficient documentation

## 2021-05-14 DIAGNOSIS — I10 Essential (primary) hypertension: Secondary | ICD-10-CM | POA: Diagnosis not present

## 2021-05-14 DIAGNOSIS — Z23 Encounter for immunization: Secondary | ICD-10-CM | POA: Diagnosis not present

## 2021-05-14 DIAGNOSIS — O0991 Supervision of high risk pregnancy, unspecified, first trimester: Secondary | ICD-10-CM

## 2021-05-14 DIAGNOSIS — Z86718 Personal history of other venous thrombosis and embolism: Secondary | ICD-10-CM | POA: Diagnosis not present

## 2021-05-14 DIAGNOSIS — Z3A12 12 weeks gestation of pregnancy: Secondary | ICD-10-CM | POA: Diagnosis not present

## 2021-05-14 DIAGNOSIS — O09521 Supervision of elderly multigravida, first trimester: Secondary | ICD-10-CM

## 2021-05-14 DIAGNOSIS — O0990 Supervision of high risk pregnancy, unspecified, unspecified trimester: Secondary | ICD-10-CM | POA: Diagnosis not present

## 2021-05-14 DIAGNOSIS — Z7689 Persons encountering health services in other specified circumstances: Secondary | ICD-10-CM | POA: Diagnosis not present

## 2021-05-14 HISTORY — DX: Personal history of other complications of pregnancy, childbirth and the puerperium: Z87.59

## 2021-05-14 MED ORDER — ENOXAPARIN SODIUM 40 MG/0.4ML IJ SOSY
40.0000 mg | PREFILLED_SYRINGE | INTRAMUSCULAR | 6 refills | Status: DC
  Filled 2021-05-14: qty 12, 30d supply, fill #0
  Filled 2021-06-09: qty 12, 30d supply, fill #1
  Filled 2021-07-06: qty 12, 30d supply, fill #2
  Filled 2021-07-30: qty 12, 30d supply, fill #3
  Filled 2021-09-28: qty 12, 30d supply, fill #4
  Filled 2021-10-27: qty 12, 30d supply, fill #5
  Filled 2021-11-26: qty 12, 30d supply, fill #6

## 2021-05-14 MED ORDER — ASPIRIN EC 81 MG PO TBEC
81.0000 mg | DELAYED_RELEASE_TABLET | Freq: Every day | ORAL | 2 refills | Status: DC
  Filled 2021-05-14: qty 300, 300d supply, fill #0

## 2021-05-14 NOTE — Progress Notes (Signed)
History:   Narelle Schoening is a 35 y.o. G2P0101 at [redacted]w[redacted]d by IVF dating being seen today for her first obstetrical visit.  Her obstetrical history is significant for advanced maternal age, HTN, pregnancy being conceived by IVF, history of DVT and PE on Lovenox, history of placental previa requiring delivery at 29 weeks due to bleeding.  Pregnancy history fully reviewed.  Patient reports no complaints.      HISTORY: OB History  Gravida Para Term Preterm AB Living  2 1 0 1 0 1  SAB IAB Ectopic Multiple Live Births  0 0 0 0 1    # Outcome Date GA Lbr Len/2nd Weight Sex Delivery Anes PTL Lv  2 Current           1 Preterm 08/27/18 [redacted]w[redacted]d  3 lb 2.8 oz (1.44 kg) M CS-LTranv Gen  LIV     Name: MERIT, GADSBY     Apgar1: 4  Apgar5: 7  Last pap smear was done 09/08/2020 and was normal with negative HRHPV.  Past Medical History:  Diagnosis Date   Anemia    DVT (deep venous thrombosis) ([place]) 2017   Essential hypertension 09/16/2014   Hyperlipidemia    Pregnancy resulting from in vitro fertilization, antepartum 04/12/2018   Patient of Dr. Kerin Perna.  EDD 11/10/2018 by IVF dating.  [x]  fetal echo: normal except known PACs   Pulmonary embolism ([place]) 09/2015   Vitamin D deficiency 08/12/2017   Past Surgical History:  Procedure Laterality Date   CESAREAN SECTION N/A 08/27/2018   Procedure: CESAREAN SECTION;  Surgeon: Woodroe Mode, MD;  Location: [facility];  Service: Obstetrics;  Laterality: N/A;   CHOLECYSTECTOMY     Family History  Problem Relation Age of Onset   Hypertension Mother    Hypertension Father    Diabetes Father    Cancer Father    Social History   Tobacco Use   Smoking status: Never   Smokeless tobacco: Never  Vaping Use   Vaping Use: Never used  Substance Use Topics   Alcohol use: Not Currently    Alcohol/week: 0.0 standard drinks    Comment: rarely   Drug use: No   No Known Allergies Current Outpatient Medications on File Prior to Visit   Medication Sig Dispense Refill   Prenatal Vit-Fe Fumarate-FA (MULTIVITAMIN-PRENATAL) 27-0.8 MG TABS tablet Take 1 tablet by mouth daily at 12 noon.     Ferrous Gluconate-C-Folic Acid (IRON-C PO) Take by mouth.     No current facility-administered medications on file prior to visit.    Review of Systems Pertinent items noted in HPI and remainder of comprehensive ROS otherwise negative.  Physical Exam:   Vitals:   05/14/21 1432  BP: 138/85  Pulse: 99  Weight: 162 lb (73.5 kg)   Fetal Heart Rate (bpm): 169  General: well-developed, well-nourished female in no acute distress  Breasts:  deferred  Skin: normal coloration and turgor, no rashes  Neurologic: oriented, normal, negative, normal mood  Extremities: normal strength, tone, and muscle mass, ROM of all joints is normal  HEENT PERRLA, extraocular movement intact and sclera clear, anicteric  Neck supple and no masses  Cardiovascular: regular rate and rhythm  Respiratory:  no respiratory distress, normal breath sounds  Abdomen: soft, non-tender; bowel sounds normal; no masses,  no organomegaly  Pelvic: deferred    Assessment:    Pregnancy: G2P0101 Patient Active Problem List   Diagnosis Date Noted   History of placenta previa 05/14/2021   Multigravida  of advanced maternal age in first trimester 05/14/2021   Endometriosis 01/08/2021   Supervision of high-risk pregnancy 04/12/2018   History of pulmonary embolus (PE) 08/12/2017   History of DVT (deep vein thrombosis) 08/12/2017   History of goiter 08/12/2017   Essential hypertension 09/16/2014     Plan:    1. Essential hypertension Aspirin prescribed for preeclampsia prevention. Baseline labs ordered.  - aspirin EC 81 MG tablet; Take 1 tablet (81 mg total) by mouth daily. Take after 12 weeks for prevention of preeclampsia later in pregnancy  Dispense: 300 tablet; Refill: 2 - Comprehensive metabolic panel - Protein / creatinine ratio, urine  2. History of pulmonary  embolus (PE) 3. History of DVT (deep vein thrombosis) Prophylactic Lovenox refilled, will take throughout pregnancy up till six weeks postpartum. - enoxaparin (LOVENOX) 40 MG/0.4ML injection; Inject 0.4 mLs (40 mg total) into the skin daily.  Dispense: 30 mL; Refill: 6  4. History of placenta previa Will follow up scans.  5. History of goiter - TSH checked today.  6. Need for immunization against influenza - Flu Vaccine QUAD 73mo+IM (Fluarix, Fluzone & Alfiuria Quad PF) given today.  61. Multigravida of advanced maternal age in first trimester 8. [redacted] weeks gestation of pregnancy 9. Supervision of high risk pregnancy in first trimester - Genetic Screening - Hemoglobin A1c - Culture, OB Urine - [place] MFM OB DETAIL +14 WK; Future - CBC/D/Plt+RPR+Rh+ABO+RubIgG... - GC/Chlamydia probe amp ([place])not at [facility] - [place] MFM OB 11-14 WEEK ANATOMY; Future Initial labs drawn. Continue prenatal vitamins. Problem list reviewed and updated. Genetic Screening discussed, First trimester anatomy scan and NIPS: ordered. Ultrasound discussed; fetal anatomic survey: ordered. Anticipatory guidance about prenatal visits given including labs, ultrasounds, and testing. Discussed usage of Babyscripts and virtual visits as additional source of managing and completing prenatal visits in midst of coronavirus and pandemic.   Routine obstetric precautions reviewed. Encouraged to seek out care at office or emergency room [facility] preferred) for urgent and/or emergent concerns. Return in about 4 weeks (around 06/11/2021) for OFFICE OB VISIT (MD only).     [name], MD, [place] for [employer], [place]

## 2021-05-14 NOTE — Progress Notes (Signed)
First trimester [place] scheduled for 10/12@ 1415  Anatomy scan scheduled for 12/2@ 1430.

## 2021-05-14 NOTE — Patient Instructions (Signed)

## 2021-05-15 ENCOUNTER — Other Ambulatory Visit (HOSPITAL_COMMUNITY): Payer: Self-pay

## 2021-05-15 LAB — CBC/D/PLT+RPR+RH+ABO+RUBIGG...
Antibody Screen: NEGATIVE
Basophils Absolute: 0 10*3/uL (ref 0.0–0.2)
Basos: 0 %
EOS (ABSOLUTE): 0.1 10*3/uL (ref 0.0–0.4)
Eos: 2 %
HCV Ab: <0.1 s/co ratio (ref 0.0–0.9)
HIV Screen 4th Generation wRfx: NONREACTIVE
Hematocrit: 36.7 % (ref 34.0–46.6)
Hemoglobin: 12.7 g/dL (ref 11.1–15.9)
Hepatitis B Surface Ag: NEGATIVE
Immature Grans (Abs): 0 10*3/uL (ref 0.0–0.1)
Immature Granulocytes: 0 %
Lymphocytes Absolute: 2 10*3/uL (ref 0.7–3.1)
Lymphs: 28 %
MCH: 32 pg (ref 26.6–33.0)
MCHC: 34.6 g/dL (ref 31.5–35.7)
MCV: 92 fL (ref 79–97)
Monocytes Absolute: 0.6 10*3/uL (ref 0.1–0.9)
Monocytes: 9 %
Neutrophils Absolute: 4.4 10*3/uL (ref 1.4–7.0)
Neutrophils: 61 %
Platelets: 310 10*3/uL (ref 150–450)
RBC: 3.97 x10E6/uL (ref 3.77–5.28)
RDW: 13.2 % (ref 11.7–15.4)
RPR Ser Ql: NONREACTIVE
Rh Factor: POSITIVE
Rubella Antibodies, IGG: 11 index (ref >0.99)
WBC: 7.2 10*3/uL (ref 3.4–10.8)

## 2021-05-15 LAB — TSH: TSH: 0.609 u[IU]/mL (ref 0.450–4.500)

## 2021-05-15 LAB — HEMOGLOBIN A1C
Est. average glucose Bld gHb Est-mCnc: 117 mg/dL
Hgb A1c MFr Bld: 5.7 % — ABNORMAL HIGH (ref 4.8–5.6)

## 2021-05-15 LAB — PROTEIN / CREATININE RATIO, URINE
Creatinine, Urine: 220 mg/dL
Protein, Ur: 27.3 mg/dL
Protein/Creat Ratio: 124 mg/g creat (ref 0–200)

## 2021-05-15 LAB — COMPREHENSIVE METABOLIC PANEL
ALT: 10 IU/L (ref 0–32)
AST: 17 IU/L (ref 0–40)
Albumin/Globulin Ratio: 1.8 (ref 1.2–2.2)
Albumin: 4.2 g/dL (ref 3.8–4.8)
Alkaline Phosphatase: 56 IU/L (ref 44–121)
BUN/Creatinine Ratio: 9 (ref 9–23)
BUN: 7 mg/dL (ref 6–20)
Bilirubin Total: 0.5 mg/dL (ref 0.0–1.2)
CO2: 20 mmol/L (ref 20–29)
Calcium: 9.8 mg/dL (ref 8.7–10.2)
Chloride: 100 mmol/L (ref 96–106)
Creatinine, Ser: 0.75 mg/dL (ref 0.57–1.00)
Globulin, Total: 2.3 g/dL (ref 1.5–4.5)
Glucose: 96 mg/dL (ref 70–99)
Potassium: 3.9 mmol/L (ref 3.5–5.2)
Sodium: 135 mmol/L (ref 134–144)
Total Protein: 6.5 g/dL (ref 6.0–8.5)
eGFR: 105 mL/min/{1.73_m2} (ref >59)

## 2021-05-15 LAB — HCV INTERPRETATION

## 2021-05-16 LAB — URINE CULTURE, OB REFLEX

## 2021-05-16 LAB — CULTURE, OB URINE

## 2021-05-20 ENCOUNTER — Ambulatory Visit: Payer: 59

## 2021-05-27 ENCOUNTER — Other Ambulatory Visit: Payer: Self-pay

## 2021-05-27 DIAGNOSIS — O0992 Supervision of high risk pregnancy, unspecified, second trimester: Secondary | ICD-10-CM

## 2021-05-28 ENCOUNTER — Ambulatory Visit (HOSPITAL_BASED_OUTPATIENT_CLINIC_OR_DEPARTMENT_OTHER): Payer: 59

## 2021-05-28 ENCOUNTER — Other Ambulatory Visit: Payer: Self-pay

## 2021-05-28 ENCOUNTER — Other Ambulatory Visit: Payer: 59

## 2021-05-28 ENCOUNTER — Encounter: Payer: Self-pay | Admitting: *Deleted

## 2021-05-28 ENCOUNTER — Ambulatory Visit: Payer: 59 | Attending: Obstetrics & Gynecology | Admitting: *Deleted

## 2021-05-28 ENCOUNTER — Other Ambulatory Visit: Payer: Self-pay | Admitting: Obstetrics & Gynecology

## 2021-05-28 ENCOUNTER — Ambulatory Visit: Payer: 59 | Attending: Obstetrics and Gynecology | Admitting: Obstetrics and Gynecology

## 2021-05-28 VITALS — BP 138/77 | HR 101

## 2021-05-28 DIAGNOSIS — O4413 Placenta previa with hemorrhage, third trimester: Secondary | ICD-10-CM | POA: Insufficient documentation

## 2021-05-28 DIAGNOSIS — Z363 Encounter for antenatal screening for malformations: Secondary | ICD-10-CM | POA: Diagnosis not present

## 2021-05-28 DIAGNOSIS — Z86711 Personal history of pulmonary embolism: Secondary | ICD-10-CM | POA: Diagnosis not present

## 2021-05-28 DIAGNOSIS — O3413 Maternal care for benign tumor of corpus uteri, third trimester: Secondary | ICD-10-CM | POA: Diagnosis not present

## 2021-05-28 DIAGNOSIS — Z8759 Personal history of other complications of pregnancy, childbirth and the puerperium: Secondary | ICD-10-CM

## 2021-05-28 DIAGNOSIS — O09212 Supervision of pregnancy with history of pre-term labor, second trimester: Secondary | ICD-10-CM | POA: Insufficient documentation

## 2021-05-28 DIAGNOSIS — Z86718 Personal history of other venous thrombosis and embolism: Secondary | ICD-10-CM

## 2021-05-28 DIAGNOSIS — Z3A14 14 weeks gestation of pregnancy: Secondary | ICD-10-CM

## 2021-05-28 DIAGNOSIS — Z3A12 12 weeks gestation of pregnancy: Secondary | ICD-10-CM

## 2021-05-28 DIAGNOSIS — O0992 Supervision of high risk pregnancy, unspecified, second trimester: Secondary | ICD-10-CM

## 2021-05-28 DIAGNOSIS — D259 Leiomyoma of uterus, unspecified: Secondary | ICD-10-CM | POA: Diagnosis not present

## 2021-05-28 DIAGNOSIS — O10012 Pre-existing essential hypertension complicating pregnancy, second trimester: Secondary | ICD-10-CM

## 2021-05-28 DIAGNOSIS — O09523 Supervision of elderly multigravida, third trimester: Secondary | ICD-10-CM | POA: Insufficient documentation

## 2021-05-28 DIAGNOSIS — O09521 Supervision of elderly multigravida, first trimester: Secondary | ICD-10-CM

## 2021-05-28 DIAGNOSIS — D251 Intramural leiomyoma of uterus: Secondary | ICD-10-CM | POA: Insufficient documentation

## 2021-05-28 DIAGNOSIS — I10 Essential (primary) hypertension: Secondary | ICD-10-CM | POA: Insufficient documentation

## 2021-05-28 DIAGNOSIS — O3412 Maternal care for benign tumor of corpus uteri, second trimester: Secondary | ICD-10-CM | POA: Diagnosis not present

## 2021-05-28 DIAGNOSIS — O09522 Supervision of elderly multigravida, second trimester: Secondary | ICD-10-CM

## 2021-05-28 DIAGNOSIS — O34211 Maternal care for low transverse scar from previous cesarean delivery: Secondary | ICD-10-CM | POA: Insufficient documentation

## 2021-05-28 DIAGNOSIS — O09293 Supervision of pregnancy with other poor reproductive or obstetric history, third trimester: Secondary | ICD-10-CM | POA: Diagnosis not present

## 2021-05-28 DIAGNOSIS — Z7901 Long term (current) use of anticoagulants: Secondary | ICD-10-CM | POA: Diagnosis not present

## 2021-05-28 DIAGNOSIS — Z3A29 29 weeks gestation of pregnancy: Secondary | ICD-10-CM | POA: Diagnosis not present

## 2021-05-28 DIAGNOSIS — O34219 Maternal care for unspecified type scar from previous cesarean delivery: Secondary | ICD-10-CM | POA: Diagnosis not present

## 2021-05-28 NOTE — Progress Notes (Signed)
Maternal-Fetal Medicine   Name: Melissa Ruiz DOB: 01-31-1986 MRN: [id number] Referring Provider: [name], MD  I had the pleasure of seeing Dr. Horris Latino [facility]. She is G2 P0101 at 14-weeks' gestation and is here for early ultrasound evaluation.  Obstetric history significant for a preterm low-transverse cesarean delivery in January 2020 at [redacted] weeks gestation of a female infant weighing 1,440 grams at birth.  Her pregnancy was complicated by placenta previa.  At [redacted] weeks gestation patient was admitted with vaginal bleeding and had cesarean section.  Her son was in the NICU for 8 weeks before being discharged.  He is in good health with no neurological complications. GYN history: IVF and embryo transfer pregnancy.  No history of cervical surgeries.  Past medical history significant for pulmonary embolism (2017).  Patient was on oral contraceptives.  She takes Lovenox prophylaxis.  She does not have hypertension or diabetes.  Her most recent hemoglobin A1c was 5.7%.  Past surgical history: Cesarean section, cholecystectomy. Medications: Prenatal vitamins, low-dose aspirin, Lovenox 40 mg daily, famotidine 20 mg daily, iron supplements. Allergies: No known drug allergies. Social history: Denies tobacco or drug or alcohol use.  She has been married 2 years (together since 2014).  Husband is in good health. Family history: No history of venous thromboembolism in the family.    Prenatal course: On cell free fetal DNA screening, the risks of fetal aneuploidies are not increased.  Ultrasound Single intrauterine pregnancy.  Fetal biometry is consistent with the previously established dates.  Good fetal heart activity seen.  Fetal anatomical survey is very limited because of early gestational age. No obvious fetal structural defects are seen.  Placenta is posterior and there is no evidence of previa or placenta accreta spectrum.  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  Our concerns include: History of placenta previa Recurrence of placenta previa is seen in about 5 to 6% of cases.  I reassured ultrasound finding of normally located placenta and that there is no evidence of previa or placenta accreta spectrum.  Although Ultrasound has limitations in accurately establishing the diagnosis of placenta accreta spectrum, the normally-located placenta is reassuring.  History of pulmonary embolism Pregnancy increases risk of venous thromboembolism.  Normal pregnancy is associated with an incidence of VTE in 1 to 2 per thousand pregnant women.  Her history of pulmonary embolism, which is hormonally related, is associated with a small absolute increase in pregnancy (3 to 4-fold increase).  Patient had successful outcome in previous pregnancy without any episode of VTE. I discussed lovenox treatment in pregnancy and substitution with unfractionated heparin at 46- or 37-weeks' gestation.  Chronic hypertension Her blood pressures are well controlled without antihypertensives.  I counseled her that hypertension is associated with an increased risk of preeclampsia.  Discussed the benefit of low-dose aspirin prophylaxis that delays or prevents preeclampsia.  We recommend serial fetal growth assessments every 4 weeks till delivery.  Patient requires antihypertensives, will recommend weekly BPP from [redacted] weeks gestation till delivery.  Provided blood pressures are well controlled she can be delivered at [redacted] weeks gestation.  Previous cesarean delivery I reviewed the operative note and she had low transverse cesarean section.  I discussed the benefit of VBAC and the risk of scar dehiscence in about 1% of pregnancies.  Patient can safely attempt VBAC.  Alternatively, she may opt to have repeat cesarean delivery at can be performed at [redacted] weeks gestation.  Recommendations -Patient has an appointment for fetal  anatomy scan. -Fetal growth assessments every 4  weeks. -Continue Lovenox to 72- or 37-weeks' gestation and substitute with unfractionated heparin 10,000 units twice daily. -Continue low-dose aspirin prophylaxis.  Thank you for consultation.  If you have any questions or concerns, please contact me the [facility].  Consultation including face-to-face (more than 50%) counseling 45 minutes.

## 2021-05-29 ENCOUNTER — Telehealth: Payer: Self-pay | Admitting: General Practice

## 2021-05-29 ENCOUNTER — Encounter: Payer: Self-pay | Admitting: Obstetrics & Gynecology

## 2021-05-29 DIAGNOSIS — O99810 Abnormal glucose complicating pregnancy: Secondary | ICD-10-CM | POA: Insufficient documentation

## 2021-05-29 LAB — GLUCOSE TOLERANCE, 1 HOUR: Glucose, 1Hr PP: 166 mg/dL (ref 70–199)

## 2021-05-29 NOTE — Telephone Encounter (Signed)
-----   Message from Osborne Oman, MD sent at 05/29/2021  8:26 AM EDT ----- Patient needs a 3 hr GTT or 2 hr GTT as soon as possible given abnormal 1 hr GTT.  Please call to inform patient of results and recommendations.

## 2021-05-29 NOTE — Telephone Encounter (Signed)
Called patient and informed her of 1 hr gtt results. Offered appt for next week 10/26 @ 820. Patient verbalized understanding.

## 2021-06-03 ENCOUNTER — Other Ambulatory Visit: Payer: Self-pay

## 2021-06-03 ENCOUNTER — Other Ambulatory Visit: Payer: 59

## 2021-06-03 DIAGNOSIS — O0992 Supervision of high risk pregnancy, unspecified, second trimester: Secondary | ICD-10-CM

## 2021-06-04 ENCOUNTER — Telehealth: Payer: Self-pay | Admitting: *Deleted

## 2021-06-04 ENCOUNTER — Encounter: Payer: Self-pay | Admitting: Obstetrics & Gynecology

## 2021-06-04 ENCOUNTER — Other Ambulatory Visit: Payer: Self-pay | Admitting: Obstetrics & Gynecology

## 2021-06-04 DIAGNOSIS — O0992 Supervision of high risk pregnancy, unspecified, second trimester: Secondary | ICD-10-CM

## 2021-06-04 DIAGNOSIS — Z86718 Personal history of other venous thrombosis and embolism: Secondary | ICD-10-CM

## 2021-06-04 DIAGNOSIS — O24419 Gestational diabetes mellitus in pregnancy, unspecified control: Secondary | ICD-10-CM

## 2021-06-04 DIAGNOSIS — I10 Essential (primary) hypertension: Secondary | ICD-10-CM

## 2021-06-04 DIAGNOSIS — O24410 Gestational diabetes mellitus in pregnancy, diet controlled: Secondary | ICD-10-CM | POA: Insufficient documentation

## 2021-06-04 DIAGNOSIS — Z86711 Personal history of pulmonary embolism: Secondary | ICD-10-CM

## 2021-06-04 LAB — GLUCOSE TOLERANCE, 2 HOURS W/ 1HR
Glucose, 1 hour: 178 mg/dL (ref 70–179)
Glucose, 2 hour: 156 mg/dL — ABNORMAL HIGH (ref 70–152)
Glucose, Fasting: 86 mg/dL (ref 70–91)

## 2021-06-04 NOTE — Telephone Encounter (Signed)
-----   Message from Osborne Oman, MD sent at 06/04/2021 12:19 PM EDT ----- Patient's 2 hr GTT is abnormal and is consistent with Gestational Diabetes [ ]  GDM education and testing supplies [ ]  Nutrition consult [ ]  Referral to Cardio - Obstetrics given HTN, h/o DVT on Lovenox and now GDM, also AMA [ ]  Fetal ECHO given early diagnosis of GDM; please schedule Orders placed for referrals and ECHO. Please call to inform patient of results and recommendations.

## 2021-06-04 NOTE — Telephone Encounter (Addendum)
-  Called Dr Cotton/ Pediatric Cardiology to schedule fetal echo. Scheduled for 07/09/21 at [place] will call patient to schedule -ordered GDM supplies - will call patient and schedule referral to GDM per her preference.  I called [facility] and left a voicemail I was calling with information from her provider and to schedule appointments; but since I did not reach you I will send you a detailed MyChart message- I can see that you have seen your results and recommendation from your provider.  Ermin Parisien,RN

## 2021-06-05 ENCOUNTER — Ambulatory Visit (INDEPENDENT_AMBULATORY_CARE_PROVIDER_SITE_OTHER): Payer: 59 | Admitting: Cardiology

## 2021-06-05 ENCOUNTER — Other Ambulatory Visit: Payer: Self-pay

## 2021-06-05 ENCOUNTER — Encounter: Payer: Self-pay | Admitting: Cardiology

## 2021-06-05 VITALS — BP 120/80 | HR 97 | Ht 65.0 in | Wt 165.7 lb

## 2021-06-05 DIAGNOSIS — E782 Mixed hyperlipidemia: Secondary | ICD-10-CM | POA: Diagnosis not present

## 2021-06-05 DIAGNOSIS — O24419 Gestational diabetes mellitus in pregnancy, unspecified control: Secondary | ICD-10-CM

## 2021-06-05 DIAGNOSIS — Z9189 Other specified personal risk factors, not elsewhere classified: Secondary | ICD-10-CM

## 2021-06-05 DIAGNOSIS — O10919 Unspecified pre-existing hypertension complicating pregnancy, unspecified trimester: Secondary | ICD-10-CM | POA: Diagnosis not present

## 2021-06-05 NOTE — Patient Instructions (Signed)
Medication Instructions:  Your physician recommends that you continue on your current medications as directed. Please refer to the Current Medication list given to you today.  *If you need a refill on your cardiac medications before your next appointment, please call your pharmacy*   Lab Work: None If you have labs (blood work) drawn today and your tests are completely normal, you will receive your results only by: [place] (if you have MyChart) OR A paper copy in the mail If you have any lab test that is abnormal or we need to change your treatment, we will call you to review the results.   Testing/Procedures: None    Follow-Up: At [facility], you and your health needs are our priority.  As part of our continuing mission to provide you with exceptional heart care, we have created designated Provider Care Teams.  These Care Teams include your primary Cardiologist (physician) and Advanced Practice Providers (APPs -  Physician Assistants and Nurse Practitioners) who all work together to provide you with the care you need, when you need it.  We recommend signing up for the patient portal called "MyChart".  Sign up information is provided on this After Visit Summary.  MyChart is used to connect with patients for Virtual Visits (Telemedicine).  Patients are able to view lab/test results, encounter notes, upcoming appointments, etc.  Non-urgent messages can be sent to your provider as well.   To learn more about what you can do with MyChart, go to [email or web address].    Your next appointment:   February   The format for your next appointment:   In Person  Provider:   Berniece Salines, DO [address], [place], [place] [address] Or Loistine Simas [facility] [address], [place], [place] [address]    Other Instructions

## 2021-06-05 NOTE — Progress Notes (Signed)
Cardio-Obstetrics Clinic  New Evaluation  Date:  06/08/2021   ID:  Melissa Ruiz, Melissa Ruiz 12-28-1985, MRN [id number]  PCP:  Billie Ruddy, MD   [facility] Providers Cardiologist:  Berniece Salines, DO  Electrophysiologist:  None       Referring MD: Osborne Oman, MD   Chief Complaint: " I have hypertension recently has been diagnosed with gestational diabetes"  History of Present Illness:    Melissa Ruiz is a 35 y.o. female [G2P0101] who is being seen today for the evaluation of cardiovascular risk factors in pregnancy with hypertension and gestational diabetes at the request of Anyanwu, Sallyanne Havers, MD.   She has a medical history of hypertension which was diagnosed several years ago while the patient was a resident.  At that time she was started antihypertensive medication and was subsequently taken off, PE/DVT which was provoked the patient completed her 6 months course of Xarelto and was noted not to have any hypercoagulable state, hyperlipidemia she was previously on statin which she has stopped for now due to being pregnant.  She is currently not on any hypertensive medication and her blood pressure has been controlled.  She is currently 15 weeks and 2 days pregnant and was diagnosed with gestational diabetes.  She is not experiencing any chest pain, shortness of breath or any palpitations.    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   Prior CV Studies Reviewed: The following studies were reviewed today: None today  Past Medical History:  Diagnosis Date   Anemia    DVT (deep venous thrombosis) ([place]) 2017   Essential hypertension 09/16/2014   Hyperlipidemia    Pregnancy resulting from in vitro fertilization, antepartum 04/12/2018   Patient of Dr. Kerin Perna.  EDD 11/10/2018 by IVF dating.  [x]  fetal echo: normal except known PACs   Pulmonary embolism ([place]) 09/2015   Vitamin D deficiency 08/12/2017     Past Surgical History:  Procedure Laterality Date   CESAREAN SECTION N/A 08/27/2018   Procedure: CESAREAN SECTION;  Surgeon: Woodroe Mode, MD;  Location: [facility];  Service: Obstetrics;  Laterality: N/A;   CHOLECYSTECTOMY        OB History     Gravida  2   Para  1   Term  0   Preterm  1   AB  0   Living  1      SAB  0   IAB  0   Ectopic  0   Multiple  0   Live Births  1               Current Medications: Current Meds  Medication Sig   aspirin EC 81 MG tablet Take 1 tablet (81 mg total) by mouth daily. Take after 12 weeks for prevention of preeclampsia later in pregnancy   enoxaparin (LOVENOX) 40 MG/0.4ML injection Inject 0.4 mLs (40 mg total) into the skin daily.   famotidine (PEPCID) 20 MG tablet Take 20 mg by mouth 2 (two) times daily.   Ferrous Gluconate-C-Folic Acid (IRON-C PO) Take by mouth.   Prenatal Vit-Fe Fumarate-FA (MULTIVITAMIN-PRENATAL) 27-0.8 MG TABS tablet Take 1 tablet by mouth daily at 12 noon.     Allergies:   Patient has no known allergies.   Social History   Socioeconomic History   Marital status: Married    Spouse name: Not on file   Number of children: Not on file  Years of education: Not on file   Highest education level: Not on file  Occupational History   Not on file  Tobacco Use   Smoking status: Never   Smokeless tobacco: Never  Vaping Use   Vaping Use: Never used  Substance and Sexual Activity   Alcohol use: Not Currently    Alcohol/week: 0.0 standard drinks    Comment: rarely   Drug use: No   Sexual activity: Yes    Birth control/protection: None  Other Topics Concern   Not on file  Social History Narrative   Not on file   Social Determinants of Health   Financial Resource Strain: Low Risk    Difficulty of Paying Living Expenses: Not hard at all  Food Insecurity: No Food Insecurity   Worried About Charity fundraiser in the Last Year: Never true   Ran Out of Food in the Last Year: Never  true  Transportation Needs: No Transportation Needs   Lack of Transportation (Medical): No   Lack of Transportation (Non-Medical): No  Physical Activity: Not on file  Stress: Not on file  Social Connections: Not on file      Family History  Problem Relation Age of Onset   Hypertension Mother    Hypertension Father    Diabetes Father    Cancer Father       ROS:   Please see the history of present illness.     All other systems reviewed and are negative.   Labs/EKG Reviewed:    EKG:   EKG is was ordered today.  The ekg ordered today demonstrates sinus rhythm, heart rate 97 beats minute.  Recent Labs: 05/14/2021: ALT 10; BUN 7; Creatinine, Ser 0.75; Hemoglobin 12.7; Platelets 310; Potassium 3.9; Sodium 135; TSH 0.609   Recent Lipid Panel Lab Results  Component Value Date/Time   CHOL 202 (H) 10/29/2020 10:49 AM   TRIG 73.0 10/29/2020 10:49 AM   HDL 63.60 10/29/2020 10:49 AM   CHOLHDL 3 10/29/2020 10:49 AM   LDLCALC 123 (H) 10/29/2020 10:49 AM    Physical Exam:    VS:  BP 120/80 (BP Location: Left Arm)   Pulse 97   Ht 5\' 5"  (1.651 m)   Wt 165 lb 11.2 oz (75.2 kg)   LMP 10/22/2020   SpO2 99%   BMI 27.57 kg/m     Wt Readings from Last 3 Encounters:  06/05/21 165 lb 11.2 oz (75.2 kg)  05/14/21 162 lb (73.5 kg)  10/29/20 154 lb 6.4 oz (70 kg)     GEN: Very pleasant lady, well nourished, well developed in no acute distress HEENT: Normal NECK: No JVD; No carotid bruits LYMPHATICS: No lymphadenopathy CARDIAC: RRR, no murmurs, rubs, gallops RESPIRATORY:  Clear to auscultation without rales, wheezing or rhonchi  ABDOMEN: Soft, non-tender, non-distended MUSCULOSKELETAL:  No edema; No deformity  SKIN: Warm and dry NEUROLOGIC:  Alert and oriented x 3 PSYCHIATRIC:  Normal affect    Risk Assessment/Risk Calculators:     CARPREG II Risk Prediction Index Score:  1.  The patient's risk for a primary cardiac event is 5%.            ASSESSMENT & PLAN:     Gestational diabetes-recently diagnosed Chronic hypertension in pregnancy Hyperlipidemia  Today's visit was heavily weighted on discussing about cardiovascular prevention and her current risk factors.  Our goal during this pregnancy is to avoid adverse pregnancy outcomes.  Her blood pressure today is acceptable, and the patient tells me at home  she has been taking her blood pressure systolics has been running between 381-017 mmHg and diastolics has been in the 170s.    While she is pregnant our goal is to make sure we monitor the patient's blood pressure given her history of hypertension.  She is also on board with taking her blood pressure daily.  She understands that if her blood pressure goes above 130 and especially 510 mmHg systolic to notify me right away.  I would like to see the patient at the start of her third trimester as well.  I am very happy that her OB/GYN has started her on the aspirin 81 mg daily for prophylaxis for preeclampsia.  I also explained to the patient that this blood pressure monitor will continue in the postpartum period as that is also one of the most vulnerable time for adverse cardiovascular outcomes.  She is off statin now for hyperlipidemia I agree with this.  And I would recommend against starting Crestor while breast-feeding.  When we repeat her lipid profile in the postpartum setting at which time we will also get LP(a) to understand as I do suspect familial hyperlipidemia is playing a role greatly here.  Please do not get her lipid profile while she is pregnant as this will be elevated.  ASCVD risk based on calculator is less than 7.5% , but with her risk factors it may be beneficial at some point to get a coronary calcium score done.  The patient is in agreement with the above plan. The patient left the office in stable condition.  The patient will follow up in 3 months.  Patient Instructions  Medication Instructions:  Your physician recommends that you  continue on your current medications as directed. Please refer to the Current Medication list given to you today.  *If you need a refill on your cardiac medications before your next appointment, please call your pharmacy*   Lab Work: None If you have labs (blood work) drawn today and your tests are completely normal, you will receive your results only by: [place] (if you have MyChart) OR A paper copy in the mail If you have any lab test that is abnormal or we need to change your treatment, we will call you to review the results.   Testing/Procedures: None    Follow-Up: At [facility], you and your health needs are our priority.  As part of our continuing mission to provide you with exceptional heart care, we have created designated Provider Care Teams.  These Care Teams include your primary Cardiologist (physician) and Advanced Practice Providers (APPs -  Physician Assistants and Nurse Practitioners) who all work together to provide you with the care you need, when you need it.  We recommend signing up for the patient portal called "MyChart".  Sign up information is provided on this After Visit Summary.  MyChart is used to connect with patients for Virtual Visits (Telemedicine).  Patients are able to view lab/test results, encounter notes, upcoming appointments, etc.  Non-urgent messages can be sent to your provider as well.   To learn more about what you can do with MyChart, go to [email or web address].    Your next appointment:   February   The format for your next appointment:   In Person  Provider:   Berniece Salines, DO [address], [place], [place] [address] Or [facility] [address], [place], [place] [address]    Other Instructions     Dispo:  No follow-ups  on file.   Medication Adjustments/Labs and Tests Ordered: Current medicines are reviewed at length with the patient today.  Concerns regarding medicines are outlined above.   Tests Ordered: Orders Placed This Encounter  Procedures   EKG 12-Lead   Medication Changes: No orders of the defined types were placed in this encounter.

## 2021-06-08 DIAGNOSIS — E782 Mixed hyperlipidemia: Secondary | ICD-10-CM | POA: Insufficient documentation

## 2021-06-08 DIAGNOSIS — O119 Pre-existing hypertension with pre-eclampsia, unspecified trimester: Secondary | ICD-10-CM | POA: Insufficient documentation

## 2021-06-08 DIAGNOSIS — O10919 Unspecified pre-existing hypertension complicating pregnancy, unspecified trimester: Secondary | ICD-10-CM | POA: Insufficient documentation

## 2021-06-09 ENCOUNTER — Other Ambulatory Visit (HOSPITAL_COMMUNITY): Payer: Self-pay

## 2021-06-11 ENCOUNTER — Other Ambulatory Visit (HOSPITAL_COMMUNITY): Payer: Self-pay

## 2021-06-11 ENCOUNTER — Other Ambulatory Visit: Payer: Self-pay

## 2021-06-11 ENCOUNTER — Encounter: Payer: 59 | Attending: Obstetrics & Gynecology | Admitting: Registered"

## 2021-06-11 ENCOUNTER — Ambulatory Visit: Payer: 59 | Admitting: Registered"

## 2021-06-11 DIAGNOSIS — O24419 Gestational diabetes mellitus in pregnancy, unspecified control: Secondary | ICD-10-CM

## 2021-06-11 DIAGNOSIS — O10912 Unspecified pre-existing hypertension complicating pregnancy, second trimester: Secondary | ICD-10-CM | POA: Insufficient documentation

## 2021-06-11 DIAGNOSIS — O24410 Gestational diabetes mellitus in pregnancy, diet controlled: Secondary | ICD-10-CM | POA: Diagnosis not present

## 2021-06-11 DIAGNOSIS — Z86711 Personal history of pulmonary embolism: Secondary | ICD-10-CM | POA: Insufficient documentation

## 2021-06-11 DIAGNOSIS — Z98891 History of uterine scar from previous surgery: Secondary | ICD-10-CM | POA: Diagnosis not present

## 2021-06-11 DIAGNOSIS — Z3A18 18 weeks gestation of pregnancy: Secondary | ICD-10-CM | POA: Insufficient documentation

## 2021-06-11 DIAGNOSIS — Z86718 Personal history of other venous thrombosis and embolism: Secondary | ICD-10-CM | POA: Insufficient documentation

## 2021-06-11 MED ORDER — FREESTYLE LIBRE 3 SENSOR MISC
1.0000 | 11 refills | Status: DC
  Filled 2021-06-11 – 2021-07-06 (×2): qty 2, 28d supply, fill #0
  Filled 2021-07-30: qty 2, 28d supply, fill #1
  Filled 2021-09-07: qty 2, 28d supply, fill #2
  Filled 2021-09-28: qty 2, 28d supply, fill #3
  Filled 2021-10-27: qty 2, 28d supply, fill #4

## 2021-06-11 NOTE — Progress Notes (Signed)
Patient was seen for Gestational Diabetes self-management on 06/11/21  Start time 0820 and End time 0918   Estimated due date: 11/25/21; [redacted]w[redacted]d  Clinical: Medications: reviewed Medical History: reviewed Labs: OGTT 1-hr screen + 2hr 156 (H), A1c 5.7%   Dietary and Lifestyle History: Patient states she is from [place] and knows that many of her traditional foods are high in carbohydrates. Pt states she was eating well before pregnancy but due to nausea during first trimester was not able to drink straight water and mostly was drinking ginger ale. Pt states nausea has resolved and mostly drinks water now.  Pt states with work would be very difficult to do SMBG and requested Rx for CGM and understands that insurance will not cover it.   Physical Activity: not assessed Stress: not assessed Sleep: not assessed  24 hr Recall:  First Meal: 1 c oatmeal with sm amount of brown sugar, milk sometimes hb egg Snack: Second meal: cafeteria when at work, home may be soup with okra, fufu Snack: Third meal: salad Snack: Beverages: water, body amour sometimes  NUTRITION INTERVENTION  Nutrition education (E-1) on the following topics:   Initial Follow-up  [x]  []  Definition of Gestational Diabetes []  []  Why dietary management is important in controlling blood glucose []  []  Effects each nutrient has on blood glucose levels []  []  Simple carbohydrates vs complex carbohydrates []  []  Fluid intake [x]  []  Creating a balanced meal plan [x]  []  Carbohydrate counting  [x]  []  When to check blood glucose levels [x]  []  Proper blood glucose monitoring techniques [x]  []  Effect of stress and stress reduction techniques  [x]  []  Exercise effect on blood glucose levels, appropriate exercise during pregnancy [x]  []  Importance of limiting caffeine and abstaining from alcohol and smoking [x]  []  Medications used for blood sugar control during pregnancy [x]  []  Hypoglycemia and rule of 15 [x]  []  Postpartum self  care  Blood glucose monitor given: none - Rx placed for Libre 3 CGM  Patient instructed to monitor glucose levels: FBS: 60 - ? 95 mg/dL (some clinics use 90 for cutoff) 1 hour: ? 140 mg/dL 2 hour: ? 120 mg/dL  Patient received handouts: Nutrition Diabetes and Pregnancy Carbohydrate Counting List  Patient will be seen for follow-up as needed.

## 2021-06-12 ENCOUNTER — Other Ambulatory Visit (HOSPITAL_COMMUNITY): Payer: Self-pay

## 2021-06-17 ENCOUNTER — Encounter: Payer: 59 | Admitting: Obstetrics & Gynecology

## 2021-06-17 ENCOUNTER — Other Ambulatory Visit: Payer: Self-pay | Admitting: Obstetrics & Gynecology

## 2021-06-17 ENCOUNTER — Other Ambulatory Visit: Payer: Self-pay

## 2021-06-17 DIAGNOSIS — H1033 Unspecified acute conjunctivitis, bilateral: Secondary | ICD-10-CM

## 2021-06-17 MED ORDER — POLYMYXIN B-TRIMETHOPRIM 10000-0.1 UNIT/ML-% OP SOLN
1.0000 [drp] | Freq: Four times a day (QID) | OPHTHALMIC | 1 refills | Status: AC
Start: 2021-06-17 — End: 2021-06-28
  Filled 2021-06-17: qty 10, 10d supply, fill #0

## 2021-06-17 NOTE — Progress Notes (Signed)
Patient called reporting that she contracted bacterial conjunctivitis from her son, who is currently being treated with erythromycin ointment. Called in Polytrim ophthalmic solution as she preferred drops to erythromycin ointment.  She was told to call or come in for worsening symptoms.   [name], MD, [place] for [employer], [place]

## 2021-06-18 ENCOUNTER — Other Ambulatory Visit (HOSPITAL_COMMUNITY): Payer: Self-pay

## 2021-06-26 ENCOUNTER — Ambulatory Visit (INDEPENDENT_AMBULATORY_CARE_PROVIDER_SITE_OTHER): Payer: 59 | Admitting: Obstetrics & Gynecology

## 2021-06-26 ENCOUNTER — Other Ambulatory Visit: Payer: Self-pay

## 2021-06-26 ENCOUNTER — Encounter: Payer: Self-pay | Admitting: Obstetrics & Gynecology

## 2021-06-26 VITALS — BP 131/83 | HR 101 | Wt 168.5 lb

## 2021-06-26 DIAGNOSIS — O24410 Gestational diabetes mellitus in pregnancy, diet controlled: Secondary | ICD-10-CM

## 2021-06-26 DIAGNOSIS — O09522 Supervision of elderly multigravida, second trimester: Secondary | ICD-10-CM

## 2021-06-26 DIAGNOSIS — Z3A18 18 weeks gestation of pregnancy: Secondary | ICD-10-CM

## 2021-06-26 DIAGNOSIS — O0991 Supervision of high risk pregnancy, unspecified, first trimester: Secondary | ICD-10-CM

## 2021-06-26 DIAGNOSIS — Z86718 Personal history of other venous thrombosis and embolism: Secondary | ICD-10-CM

## 2021-06-26 DIAGNOSIS — Z86711 Personal history of pulmonary embolism: Secondary | ICD-10-CM

## 2021-06-26 DIAGNOSIS — O10919 Unspecified pre-existing hypertension complicating pregnancy, unspecified trimester: Secondary | ICD-10-CM

## 2021-06-26 DIAGNOSIS — Z98891 History of uterine scar from previous surgery: Secondary | ICD-10-CM

## 2021-06-26 NOTE — Progress Notes (Signed)
PRENATAL VISIT NOTE  Subjective:  Melissa Ruiz is a 35 y.o. G2P0101 at [redacted]w[redacted]d being seen today for ongoing prenatal care.  She is currently monitored for the following issues for this high-risk pregnancy and has History of pulmonary embolus (PE); History of DVT (deep vein thrombosis); History of goiter; Supervision of high-risk pregnancy; Endometriosis; History of placenta previa; Multigravida of advanced maternal age; Gestational diabetes mellitus, antepartum; HTN in pregnancy, chronic; Mixed hyperlipidemia; and History of low transverse cesarean section at 29 weeks on their problem list.  Patient reports no complaints.  Contractions: Not present. Vag. Bleeding: None.  Movement: Present. Denies leaking of fluid.   The following portions of the patient's history were reviewed and updated as appropriate: allergies, current medications, past family history, past medical history, past social history, past surgical history and problem list.   Objective:   Vitals:   06/26/21 1129  BP: 131/83  Pulse: (!) 101  Weight: 168 lb 8 oz (76.4 kg)   Fetal Status: Fetal Heart Rate (bpm): 146   Movement: Present     General:  Alert, oriented and cooperative. Patient is in no acute distress.  Skin: Skin is warm and dry. No rash noted.   Cardiovascular: Normal heart rate noted  Respiratory: Normal respiratory effort, no problems with respiration noted  Abdomen: Soft, gravid, appropriate for gestational age.  Pain/Pressure: Absent     Pelvic: Cervical exam deferred        Extremities: Normal range of motion.  Edema: None  Mental Status: Normal mood and affect. Normal behavior. Normal judgment and thought content.   Imaging: [place] MFM OB COMP + 14 WK  Result Date: 05/28/2021 ----------------------------------------------------------------------  OBSTETRICS REPORT                       (Signed Final 05/28/2021 04:22 pm) ----------------------------------------------------------------------  Patient Info  ID #:       [id number]                          D.O.B.:  [birth year] (35 yrs)  Name:       Melissa Ruiz                  Visit Date: 05/28/2021 07:42 am              [place] ---------------------------------------------------------------------- Performed By  Attending:        Tama High MD        Ref. Address:     [address]                                                             [place], [place]  Performed By:     Rodrigo Ran BS      [facility]                                                             [place] [facility]                                                             [facility]  Referred By:      [facility] ---------------------------------------------------------------------- Orders  #  Description                           Code        Ordered By  1  [place] MFM OB COMP + 46 WK                76805.01    [name] ----------------------------------------------------------------------  #  Order #                     Accession #                Episode #  1  [id number]                   [id number]                 [id number] ---------------------------------------------------------------------- Indications  Advanced maternal age multigravida 49+,        O23.522  second trimester  Medical complication of pregnancy (PE,         O26.90  DVT) on Lovenox  Hypertension - Chronic/Pre-existing (no        O10.019  meds)  Previous cesarean delivery, antepartum         O34.219  Pregnancy resulting from assisted              O88.819  reproductive technology (IVF)  Antenatal screening for malformations          Z36.3  Poor obstetric history: Previous preterm       O09.219  delivery, antepartum (29 weeks)  Low risk NIPS  Poor obstetrical history (previa w/ recurrent  O09.299  bleeding)  [redacted] weeks gestation of  pregnancy                Z3A.14  Uterine fibroids affecting pregnancy in        O34.12, D25.9  second trimester, antepartum ---------------------------------------------------------------------- Fetal Evaluation  Num Of Fetuses:         1  Fetal Heart Rate(bpm):  164  Cardiac Activity:       Observed  Presentation:           Transverse, head to maternal right  Placenta:               Posterior  Amniotic Fluid  AFI  FV:      Within normal limits                              Largest Pocket(cm)                              1.8 ---------------------------------------------------------------------- Biometry  BPD:      24.8  mm     G. Age:  14w 2d                  CI:        66.26   %    70 - 86                                                          FL/HC:      14.6   %  HC:       97.7  mm     G. Age:  14w 4d                  HC/AC:      1.17        1.14 - 1.31  AC:       83.8  mm     G. Age:  14w 5d                  FL/BPD:     57.7   %  FL:       14.3  mm     G. Age:  14w 2d                  FL/AC:      17.1   %    20 - 24  Est. FW:      98  gm      0 lb 3 oz ---------------------------------------------------------------------- Gestational Age  LMP:           31w 1d        Date:  10/22/20                 EDD:   07/29/21  U/S Today:     14w 3d                                        EDD:   11/23/21  Best:          14w 1d     Det. ByLoman Chroman         EDD:   11/25/21                                      (04/15/21) ---------------------------------------------------------------------- Anatomy  Cranium:               Appears normal         Kidneys:                Visualized  Choroid Plexus:        Appears normal  Bladder:                Appears normal  Face:                  Profile appears        Upper Extremities:      Visualized                         normal  Stomach:               Appears normal, left   Lower Extremities:      Visualized                         sided  ---------------------------------------------------------------------- Cervix Uterus Adnexa  Cervix  Closed  Uterus  Multiple fibroids noted, see table below.  Right Ovary  Not visualized.  Left Ovary  Not visualized.  Cul De Sac  No free fluid seen.  Adnexa  No abnormality visualized. ---------------------------------------------------------------------- Myomas  Site                     L(cm)      W(cm)      D(cm)       Location  Posterior                3.5        3          2.5         Intramural  Posterior                2.8        2.2        1.8         Intramural ----------------------------------------------------------------------  Blood Flow                  RI       PI       Comments  Blood Flow                  RI       PI       Comments ---------------------------------------------------------------------- Impression  Single intrauterine pregnancy.  Fetal biometry is consistent  with the previously established dates.  Good fetal heart  activity seen.  Fetal anatomical survey is very limited because  of early gestational age. No obvious fetal structural defects  are seen.  Placenta is posterior and there is no evidence of  previa or placenta accreta spectrum.  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  xxxxxxxxxxxxxxxxxxxxxxxxxxxxxxxxxxxxxxxxxxxxxxxxxxxxxxxx  Consultation (see EPIC )  I had the pleasure of seeing Dr. Horris Latino today at [facility]. She is G2 P0101 at 40-  weeks' gestation and is here for early ultrasound evaluation.  Obstetric history significant for a preterm low-transverse  cesarean delivery in January 2020 at [redacted] weeks gestation of a  female infant weighing 1,440 grams at birth.  Her pregnancy  was complicated by placenta previa.  At [redacted] weeks gestation  patient was admitted with vaginal bleeding and had cesarean  section.  Her son was in the NICU for 8 weeks before being  discharged.  He is in good health with no neurological  complications.  GYN history: IVF  and embryo transfer pregnancy.  No history  of cervical surgeries.  Past medical history significant for pulmonary embolism  (2017).  Patient was on  oral contraceptives.  She takes  Lovenox prophylaxis.  She does not have hypertension or  diabetes.  Her most recent hemoglobin A1c was 5.7%.  Past surgical history: Cesarean section, cholecystectomy.  Medications: Prenatal vitamins, low-dose aspirin, Lovenox 40  mg daily, famotidine 20 mg daily, iron supplements.  Allergies: No known drug allergies.  Social history: Denies tobacco or drug or alcohol use.  She  has been married 2 years (together since 2014).  Husband is  in good health.  Family history: No history of venous thromboembolism in the  family.  Prenatal course: On cell free fetal DNA screening, the risks of  fetal aneuploidies are not increased.  Our concerns include:  History of placenta previa  Recurrence of placenta previa is seen in about 5 to 6% of  cases.  I reassured ultrasound finding of normally located  placenta and that there is no evidence of previa or placenta  accreta spectrum.  Although Ultrasound has limitations in  accurately establishing the diagnosis of placenta accreta  spectrum, the normally-located placenta is reassuring.  History of pulmonary embolism  Pregnancy increases risk of venous thromboembolism.  Normal pregnancy is associated with an incidence of VTE in  1 to 2 per thousand pregnant women.  Her history of  pulmonary embolism, which is hormonally related, is  associated with a small absolute increase in pregnancy (3 to  4-fold increase).  Patient had successful outcome in previous  pregnancy without any episode of VTE.  I discussed lovenox treatment in pregnancy and substitution  with unfractionated heparin at 62- or 37-weeks' gestation.  Chronic hypertension  Her blood pressures are well controlled without  antihypertensives.  I counseled her that hypertension is  associated with an increased risk of preeclampsia.  Discussed  the benefit of low-dose aspirin prophylaxis that  delays or prevents preeclampsia.  We recommend serial fetal growth assessments every 4  weeks till delivery.  Patient requires antihypertensives, will  recommend weekly BPP from [redacted] weeks gestation till delivery.  Provided blood pressures are well controlled she can be  delivered at [redacted] weeks gestation.  Previous cesarean delivery  I reviewed the operative note and she had low transverse  cesarean section.  I discussed the benefit of VBAC and the  risk of scar dehiscence in about 1% of pregnancies.  Patient  can safely attempt VBAC.  Alternatively, she may opt to have  repeat cesarean delivery at can be performed at [redacted] weeks  gestation. ---------------------------------------------------------------------- Recommendations  -Patient has an appointment for fetal anatomy scan.  -Fetal growth assessments every 4 weeks.  -Continue Lovenox to 30- or 37-weeks' gestation and  substitute with unfractionated heparin 10,000 units twice daily.  -Continue low-dose aspirin prophylaxis. ----------------------------------------------------------------------                  Tama High, MD Electronically Signed Final Report   05/28/2021 04:22 pm ----------------------------------------------------------------------   Assessment and Plan:  Pregnancy: G2P0101 at [redacted]w[redacted]d 1. Diet controlled gestational diabetes mellitus (GDM), antepartum Has continuous CBG, blood sugars within range. Continue diet and exercise control.  2. HTN in pregnancy, chronic Stable BP. Seen by [facility]. Serial growth scans as per MFM.  3. History of pulmonary embolus (PE) 4. History of DVT (deep vein thrombosis) Continue Lovenox as prescribed.  5. History of low transverse cesarean section at 29 weeks Desires planned cesarean section, declines TOLAC. Aware of surgical implications of Stage IV endometriosis and adhesions. Will be a two attending case.   6. [redacted] weeks gestation of pregnancy  62.  Multigravida of advanced maternal age in second trimester 8. Supervision of high risk pregnancy in first trimester Low risk NIPS. Declines AFP.  Scheduled for anatomy scan. No other complaints or concerns.  Routine obstetric precautions reviewed.  Please refer to After Visit Summary for other counseling recommendations.   Return in about 4 weeks (around 07/24/2021) for OFFICE OB VISIT (MD only).  Future Appointments  Date Time Provider [place]  07/10/2021  7:30 AM [facility] NURSE [facility]  07/10/2021  7:45 AM [facility] US4 [facility] [place]  09/18/2021  1:20 PM Tobb, Godfrey Pick, DO [facility] None    [name], MD

## 2021-07-06 ENCOUNTER — Other Ambulatory Visit (HOSPITAL_COMMUNITY): Payer: Self-pay

## 2021-07-07 ENCOUNTER — Other Ambulatory Visit (HOSPITAL_COMMUNITY): Payer: Self-pay

## 2021-07-09 ENCOUNTER — Other Ambulatory Visit (HOSPITAL_COMMUNITY): Payer: Self-pay

## 2021-07-09 ENCOUNTER — Encounter: Payer: Self-pay | Admitting: Radiology

## 2021-07-09 DIAGNOSIS — Q2112 Patent foramen ovale: Secondary | ICD-10-CM | POA: Diagnosis not present

## 2021-07-10 ENCOUNTER — Ambulatory Visit: Payer: 59 | Admitting: *Deleted

## 2021-07-10 ENCOUNTER — Ambulatory Visit: Payer: 59

## 2021-07-10 ENCOUNTER — Encounter: Payer: Self-pay | Admitting: Obstetrics & Gynecology

## 2021-07-10 ENCOUNTER — Other Ambulatory Visit: Payer: Self-pay | Admitting: *Deleted

## 2021-07-10 ENCOUNTER — Encounter: Payer: Self-pay | Admitting: *Deleted

## 2021-07-10 ENCOUNTER — Other Ambulatory Visit: Payer: Self-pay

## 2021-07-10 ENCOUNTER — Ambulatory Visit: Payer: 59 | Attending: Obstetrics & Gynecology

## 2021-07-10 VITALS — BP 129/75 | HR 116

## 2021-07-10 DIAGNOSIS — Z8759 Personal history of other complications of pregnancy, childbirth and the puerperium: Secondary | ICD-10-CM

## 2021-07-10 DIAGNOSIS — Z98891 History of uterine scar from previous surgery: Secondary | ICD-10-CM | POA: Diagnosis not present

## 2021-07-10 DIAGNOSIS — D259 Leiomyoma of uterus, unspecified: Secondary | ICD-10-CM

## 2021-07-10 DIAGNOSIS — Z3A12 12 weeks gestation of pregnancy: Secondary | ICD-10-CM | POA: Diagnosis not present

## 2021-07-10 DIAGNOSIS — O3412 Maternal care for benign tumor of corpus uteri, second trimester: Secondary | ICD-10-CM

## 2021-07-10 DIAGNOSIS — O09521 Supervision of elderly multigravida, first trimester: Secondary | ICD-10-CM | POA: Diagnosis not present

## 2021-07-10 DIAGNOSIS — I10 Essential (primary) hypertension: Secondary | ICD-10-CM | POA: Diagnosis not present

## 2021-07-10 DIAGNOSIS — O43199 Other malformation of placenta, unspecified trimester: Secondary | ICD-10-CM | POA: Insufficient documentation

## 2021-07-10 DIAGNOSIS — Z86718 Personal history of other venous thrombosis and embolism: Secondary | ICD-10-CM

## 2021-07-10 DIAGNOSIS — O09522 Supervision of elderly multigravida, second trimester: Secondary | ICD-10-CM

## 2021-07-10 DIAGNOSIS — O10912 Unspecified pre-existing hypertension complicating pregnancy, second trimester: Secondary | ICD-10-CM

## 2021-07-10 DIAGNOSIS — O24410 Gestational diabetes mellitus in pregnancy, diet controlled: Secondary | ICD-10-CM

## 2021-07-10 DIAGNOSIS — Z86711 Personal history of pulmonary embolism: Secondary | ICD-10-CM

## 2021-07-10 DIAGNOSIS — O88212 Thromboembolism in pregnancy, second trimester: Secondary | ICD-10-CM

## 2021-07-27 ENCOUNTER — Other Ambulatory Visit: Payer: Self-pay

## 2021-07-27 ENCOUNTER — Encounter: Payer: Self-pay | Admitting: Obstetrics and Gynecology

## 2021-07-27 ENCOUNTER — Ambulatory Visit (INDEPENDENT_AMBULATORY_CARE_PROVIDER_SITE_OTHER): Payer: 59 | Admitting: Obstetrics and Gynecology

## 2021-07-27 VITALS — BP 141/86 | HR 126 | Wt 167.0 lb

## 2021-07-27 DIAGNOSIS — Z8639 Personal history of other endocrine, nutritional and metabolic disease: Secondary | ICD-10-CM

## 2021-07-27 DIAGNOSIS — O09812 Supervision of pregnancy resulting from assisted reproductive technology, second trimester: Secondary | ICD-10-CM

## 2021-07-27 DIAGNOSIS — Z8759 Personal history of other complications of pregnancy, childbirth and the puerperium: Secondary | ICD-10-CM

## 2021-07-27 DIAGNOSIS — O10919 Unspecified pre-existing hypertension complicating pregnancy, unspecified trimester: Secondary | ICD-10-CM

## 2021-07-27 DIAGNOSIS — D259 Leiomyoma of uterus, unspecified: Secondary | ICD-10-CM

## 2021-07-27 DIAGNOSIS — O43199 Other malformation of placenta, unspecified trimester: Secondary | ICD-10-CM

## 2021-07-27 DIAGNOSIS — O24410 Gestational diabetes mellitus in pregnancy, diet controlled: Secondary | ICD-10-CM

## 2021-07-27 DIAGNOSIS — Z98891 History of uterine scar from previous surgery: Secondary | ICD-10-CM

## 2021-07-27 DIAGNOSIS — Z86718 Personal history of other venous thrombosis and embolism: Secondary | ICD-10-CM

## 2021-07-27 DIAGNOSIS — E559 Vitamin D deficiency, unspecified: Secondary | ICD-10-CM

## 2021-07-27 DIAGNOSIS — Z7901 Long term (current) use of anticoagulants: Secondary | ICD-10-CM

## 2021-07-27 DIAGNOSIS — O3412 Maternal care for benign tumor of corpus uteri, second trimester: Secondary | ICD-10-CM

## 2021-07-27 DIAGNOSIS — Z3A22 22 weeks gestation of pregnancy: Secondary | ICD-10-CM

## 2021-07-27 DIAGNOSIS — O09522 Supervision of elderly multigravida, second trimester: Secondary | ICD-10-CM

## 2021-07-27 DIAGNOSIS — O0992 Supervision of high risk pregnancy, unspecified, second trimester: Secondary | ICD-10-CM

## 2021-07-27 NOTE — Progress Notes (Signed)
PRENATAL VISIT NOTE  Subjective:  Melissa Ruiz is a 35 y.o. G2P0101 at [redacted]w[redacted]d being seen today for ongoing prenatal care.  She is currently monitored for the following issues for this high-risk pregnancy and has History of pulmonary embolus (PE); History of DVT (deep vein thrombosis); History of goiter; Supervision of high-risk pregnancy; Endometriosis; History of placenta previa; Multigravida of advanced maternal age; Gestational diabetes mellitus, antepartum; HTN in pregnancy, chronic; Mixed hyperlipidemia; History of low transverse cesarean section at 29 weeks; and Marginal insertion of umbilical cord affecting management of mother on their problem list.  Patient reports  fatigue .  Contractions: Not present. Vag. Bleeding: None.  Movement: Present. Denies leaking of fluid.   The following portions of the patient's history were reviewed and updated as appropriate: allergies, current medications, past family history, past medical history, past social history, past surgical history and problem list.   Objective:   Vitals:   07/27/21 1048  BP: (!) 141/86  Pulse: (!) 126  Weight: 167 lb (75.8 kg)    Fetal Status: Fetal Heart Rate (bpm): 160   Movement: Present     General:  Alert, oriented and cooperative. Patient is in no acute distress.  Skin: Skin is warm and dry. No rash noted.   Cardiovascular: Normal heart rate noted  Respiratory: Normal respiratory effort, no problems with respiration noted  Abdomen: Soft, gravid, appropriate for gestational age.  Pain/Pressure: Absent     Pelvic: Cervical exam deferred        Extremities: Normal range of motion.  Edema: None  Mental Status: Normal mood and affect. Normal behavior. Normal judgment and thought content.   Assessment and Plan:  Pregnancy: G2P0101 at [redacted]w[redacted]d 1. Supervision of high risk pregnancy in second trimester Pt to consider covid booster shot - [facility] AMB BABYSCRIPTS SCHEDULE OPTIMIZATION  2. HTN in pregnancy,  chronic On no meds currently. Seen by OB cards and f/u in 3 months. Pt states BPs and HR at home are normal. I told her BP goal in pregnancy is <140 SBP or <90 for DBP and pt would like to avoid starting anything right now. Pt to continue to check bid and bring in cuff nv to check with one here.  3. Diet controlled gestational diabetes mellitus (GDM), antepartum Pt has libre 3. Am fasting and 2h PP averages are normal but unable to tell exact numbers. Will ask DM education if there is a way but pt believes 2h PPs are usually in the 120s. I told her that I'd recommend metformin at this point, given the numbers and how early she is pregnancy. Pt would like to continue to work on her diet.   4. History of DVT (deep vein thrombosis) No issues on lovenox 40 qday  5. History of goiter Normal tsh 05/2021  6. Marginal insertion of umbilical cord affecting management of mother Getting surveillance u/s qmonth. 12/2 afi normal, 43%, ac 41%.  7. History of low transverse cesarean section at 29 weeks Pt leaning towards rpt  8. History of placenta previa Negative on u/s this pregnancy  9. Multigravida of advanced maternal age in second trimester No current issues   10. Uterine fibroids affecting pregnancy in second trimester Small. Shouldn't be an issue. F/u at rpt u/s  Site                     L(cm)      W(cm)      D(cm)  Location  Posterior                3.6        2.8        3.1         Intramural  Posterior                2.9        1.6        2.7         Intramural   11. Vitamin D deficiency Recheck today - VITAMIN D 25 Hydroxy (Vit-D Deficiency, Fractures)  12. IVF pregnancy S/p normal fetal echo  Preterm labor symptoms and general obstetric precautions including but not limited to vaginal bleeding, contractions, leaking of fluid and fetal movement were reviewed in detail with the patient. Please refer to After Visit Summary for other counseling recommendations.   Return in  about 11 days (around 08/07/2021) for in person, high risk ob, md visit.  Future Appointments  Date Time Provider [place]  08/07/2021  7:30 AM [facility] NURSE [facility]  08/07/2021  7:45 AM [facility] US5 [facility]  08/07/2021  8:55 AM Aletha Halim, MD [facility]  09/18/2021  1:20 PM Tobb, Godfrey Pick, DO [facility] None    Aletha Halim, MD

## 2021-07-28 LAB — VITAMIN D 25 HYDROXY (VIT D DEFICIENCY, FRACTURES): Vit D, 25-Hydroxy: 41.1 ng/mL (ref 30.0–100.0)

## 2021-07-30 ENCOUNTER — Other Ambulatory Visit (HOSPITAL_COMMUNITY): Payer: Self-pay

## 2021-07-30 ENCOUNTER — Other Ambulatory Visit: Payer: Self-pay | Admitting: Family Medicine

## 2021-07-30 DIAGNOSIS — K219 Gastro-esophageal reflux disease without esophagitis: Secondary | ICD-10-CM

## 2021-07-30 NOTE — Telephone Encounter (Signed)
Patient called regarding her prescription for famotidine (PEPCID) 20 MG tablet. She says that she is needing a new prescription written.  Please advise.

## 2021-07-31 ENCOUNTER — Other Ambulatory Visit (HOSPITAL_COMMUNITY): Payer: Self-pay

## 2021-07-31 MED ORDER — FAMOTIDINE 20 MG PO TABS
ORAL_TABLET | Freq: Two times a day (BID) | ORAL | 3 refills | Status: DC
  Filled 2021-07-31: qty 180, 90d supply, fill #0

## 2021-07-31 NOTE — Telephone Encounter (Signed)
Patient called to see if Dr.Banks had wrote the new prescription for her  famotidine (PEPCID) 20 MG tablet   I let patient know that message had been sent back and that we were just waiting for her.    Please advise

## 2021-08-04 ENCOUNTER — Other Ambulatory Visit (HOSPITAL_COMMUNITY): Payer: Self-pay

## 2021-08-07 ENCOUNTER — Ambulatory Visit: Payer: 59 | Attending: Obstetrics and Gynecology | Admitting: *Deleted

## 2021-08-07 ENCOUNTER — Encounter: Payer: Self-pay | Admitting: *Deleted

## 2021-08-07 ENCOUNTER — Ambulatory Visit: Payer: 59

## 2021-08-07 ENCOUNTER — Ambulatory Visit (INDEPENDENT_AMBULATORY_CARE_PROVIDER_SITE_OTHER): Payer: 59 | Admitting: Obstetrics and Gynecology

## 2021-08-07 ENCOUNTER — Other Ambulatory Visit: Payer: Self-pay

## 2021-08-07 ENCOUNTER — Ambulatory Visit (HOSPITAL_BASED_OUTPATIENT_CLINIC_OR_DEPARTMENT_OTHER): Payer: 59

## 2021-08-07 ENCOUNTER — Other Ambulatory Visit: Payer: Self-pay | Admitting: *Deleted

## 2021-08-07 VITALS — BP 131/74 | HR 104

## 2021-08-07 VITALS — BP 122/76 | HR 97 | Wt 167.0 lb

## 2021-08-07 DIAGNOSIS — O88212 Thromboembolism in pregnancy, second trimester: Secondary | ICD-10-CM | POA: Diagnosis not present

## 2021-08-07 DIAGNOSIS — Z86718 Personal history of other venous thrombosis and embolism: Secondary | ICD-10-CM

## 2021-08-07 DIAGNOSIS — O34219 Maternal care for unspecified type scar from previous cesarean delivery: Secondary | ICD-10-CM

## 2021-08-07 DIAGNOSIS — Z86711 Personal history of pulmonary embolism: Secondary | ICD-10-CM

## 2021-08-07 DIAGNOSIS — O10912 Unspecified pre-existing hypertension complicating pregnancy, second trimester: Secondary | ICD-10-CM

## 2021-08-07 DIAGNOSIS — O24419 Gestational diabetes mellitus in pregnancy, unspecified control: Secondary | ICD-10-CM | POA: Insufficient documentation

## 2021-08-07 DIAGNOSIS — Z7901 Long term (current) use of anticoagulants: Secondary | ICD-10-CM | POA: Diagnosis not present

## 2021-08-07 DIAGNOSIS — Z98891 History of uterine scar from previous surgery: Secondary | ICD-10-CM | POA: Insufficient documentation

## 2021-08-07 DIAGNOSIS — D259 Leiomyoma of uterus, unspecified: Secondary | ICD-10-CM | POA: Diagnosis not present

## 2021-08-07 DIAGNOSIS — O3412 Maternal care for benign tumor of corpus uteri, second trimester: Secondary | ICD-10-CM | POA: Insufficient documentation

## 2021-08-07 DIAGNOSIS — O09522 Supervision of elderly multigravida, second trimester: Secondary | ICD-10-CM | POA: Insufficient documentation

## 2021-08-07 DIAGNOSIS — O0992 Supervision of high risk pregnancy, unspecified, second trimester: Secondary | ICD-10-CM

## 2021-08-07 DIAGNOSIS — O24410 Gestational diabetes mellitus in pregnancy, diet controlled: Secondary | ICD-10-CM

## 2021-08-07 DIAGNOSIS — Z3A24 24 weeks gestation of pregnancy: Secondary | ICD-10-CM | POA: Diagnosis not present

## 2021-08-07 DIAGNOSIS — Z8639 Personal history of other endocrine, nutritional and metabolic disease: Secondary | ICD-10-CM

## 2021-08-07 DIAGNOSIS — O09812 Supervision of pregnancy resulting from assisted reproductive technology, second trimester: Secondary | ICD-10-CM

## 2021-08-07 DIAGNOSIS — Z363 Encounter for antenatal screening for malformations: Secondary | ICD-10-CM | POA: Diagnosis not present

## 2021-08-07 DIAGNOSIS — O43199 Other malformation of placenta, unspecified trimester: Secondary | ICD-10-CM

## 2021-08-07 DIAGNOSIS — O10919 Unspecified pre-existing hypertension complicating pregnancy, unspecified trimester: Secondary | ICD-10-CM

## 2021-08-07 NOTE — Progress Notes (Signed)
° ° ° °  PRENATAL VISIT NOTE  Subjective:  Melissa Ruiz is a 35 y.o. G2P0101 at [redacted]w[redacted]d being seen today for ongoing prenatal care.  She is currently monitored for the following issues for this high-risk pregnancy and has History of pulmonary embolus (PE); History of DVT (deep vein thrombosis); History of goiter; Supervision of high-risk pregnancy; Endometriosis; Multigravida of advanced maternal age; GDM (gestational diabetes mellitus), class A1; HTN in pregnancy, chronic; Mixed hyperlipidemia; History of low transverse cesarean section at 29 weeks; Marginal insertion of umbilical cord affecting management of mother; Pregnancy resulting from in vitro fertilization, second trimester; and Anticoagulated on their problem list.  Patient reports no complaints.  Contractions: Not present. Vag. Bleeding: None.  Movement: Present. Denies leaking of fluid.   The following portions of the patient's history were reviewed and updated as appropriate: allergies, current medications, past family history, past medical history, past social history, past surgical history and problem list.   Objective:   Vitals:   08/07/21 0844  BP: 122/76  Pulse: 97  Weight: 167 lb (75.8 kg)    Fetal Status: Fetal Heart Rate (bpm): 145   Movement: Present     General:  Alert, oriented and cooperative. Patient is in no acute distress.  Skin: Skin is warm and dry. No rash noted.   Cardiovascular: Normal heart rate noted  Respiratory: Normal respiratory effort, no problems with respiration noted  Abdomen: Soft, gravid, appropriate for gestational age.  Pain/Pressure: Present     Pelvic: Cervical exam deferred        Extremities: Normal range of motion.  Edema: None  Mental Status: Normal mood and affect. Normal behavior. Normal judgment and thought content.   Assessment and Plan:  Pregnancy: G2P0101 at [redacted]w[redacted]d 1. [redacted] weeks gestation of pregnancy  2. HTN in pregnancy, chronic Doing well on no meds Normal growth  today (26%, 645g, ac 11%, afi wnl). Continue qmonth u/s  3. Supervision of high risk pregnancy in second trimester  4. Pregnancy resulting from in vitro fertilization, second trimester S/p normal fetal echo  5. Multigravida of advanced maternal age in second trimester  6. Marginal insertion of umbilical cord affecting management of mother  7. History of pulmonary embolus (PE) Continue lovenox 40 qday  8. History of low transverse cesarean section at 29 weeks Considering delivery method  9. History of goiter 05/2021 tsh wnl  10. History of DVT (deep vein thrombosis)  11. Anticoagulated  Preterm labor symptoms and general obstetric precautions including but not limited to vaginal bleeding, contractions, leaking of fluid and fetal movement were reviewed in detail with the patient. Please refer to After Visit Summary for other counseling recommendations.   Return in about 2 weeks (around 08/21/2021) for high risk ob, md visit, in person or virtual.  Future Appointments  Date Time Provider [place]  08/24/2021  1:15 PM Radene Gunning, MD [facility]  09/04/2021  7:30 AM [facility] NURSE [facility]  09/04/2021  7:45 AM [facility] US5 [facility] [place]  09/18/2021  1:20 PM Tobb, Godfrey Pick, DO [facility] None    Aletha Halim, MD

## 2021-08-13 IMAGING — US US PELVIS COMPLETE WITH TRANSVAGINAL
1 series · 13 of 25 positions shown · non-contrast
Comparison: Prior gestational ultrasound 08/25/2018
hysterosonography 09/09/2017

CLINICAL DATA: Fibroids



[Series 1: us pelvis complete with transvaginal · 0.20mm/px · 13 of 64 slices shown]
[im 1/64]
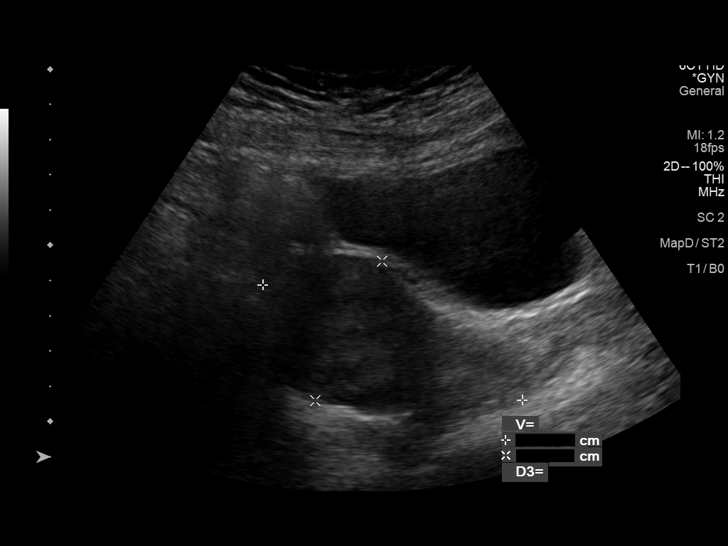
[im 6/64]
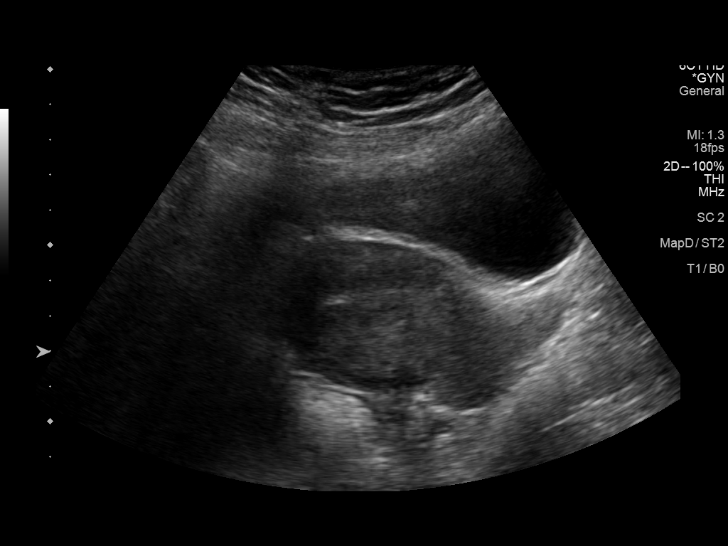
[im 11/64]
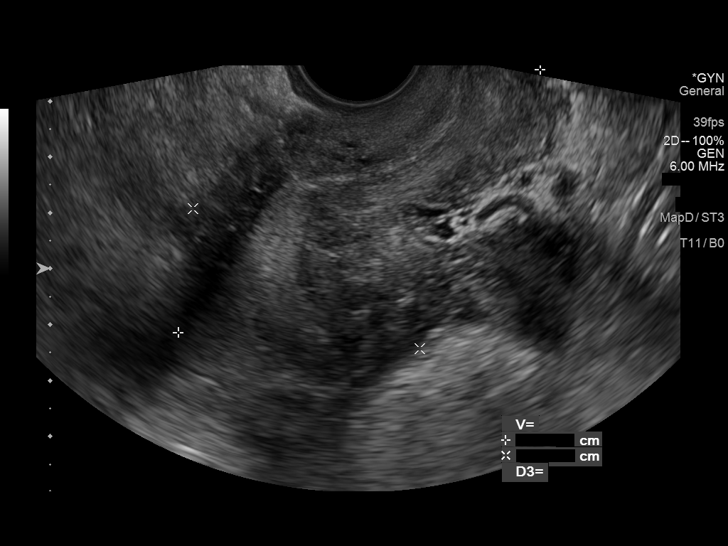
[im 16/64]
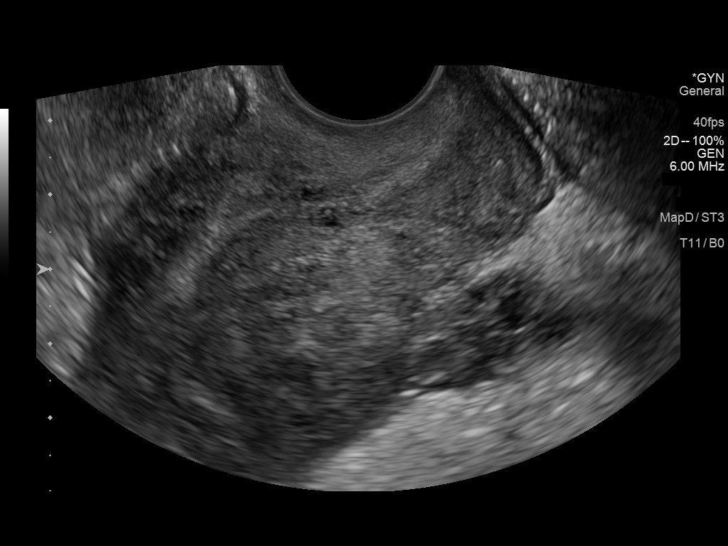
[im 22/64]
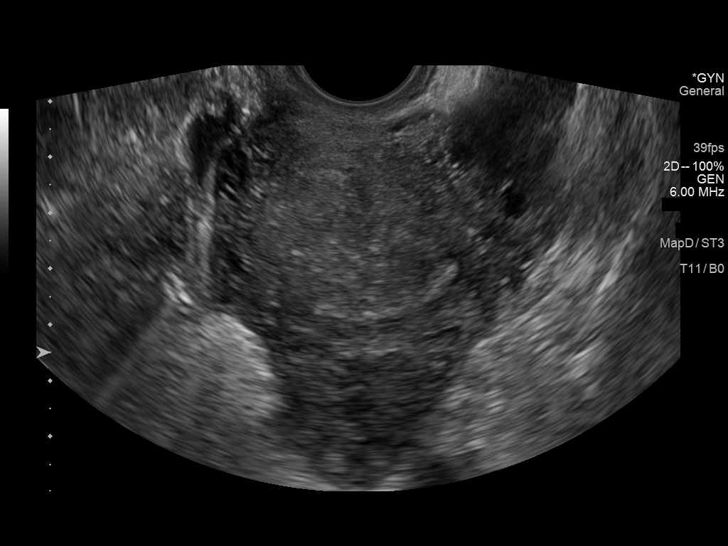
[im 27/64]
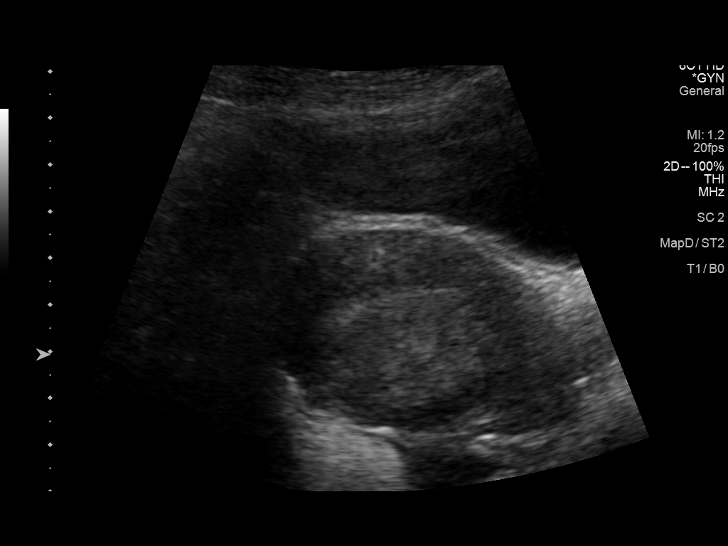
[im 32/64]
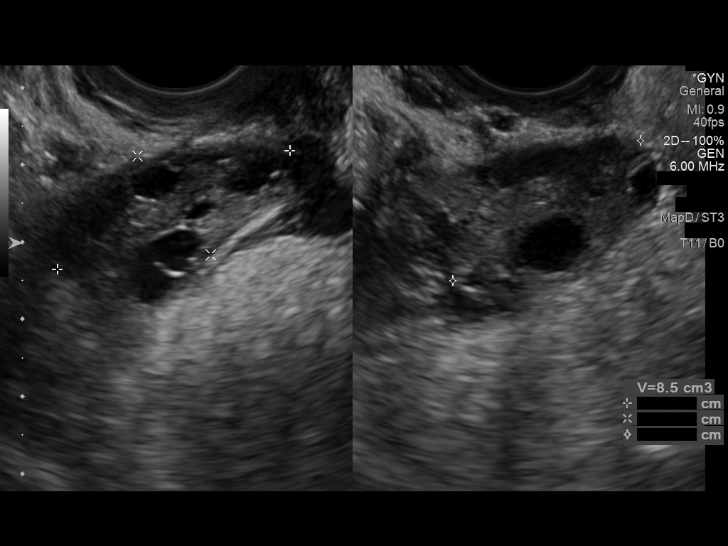
[im 37/64]
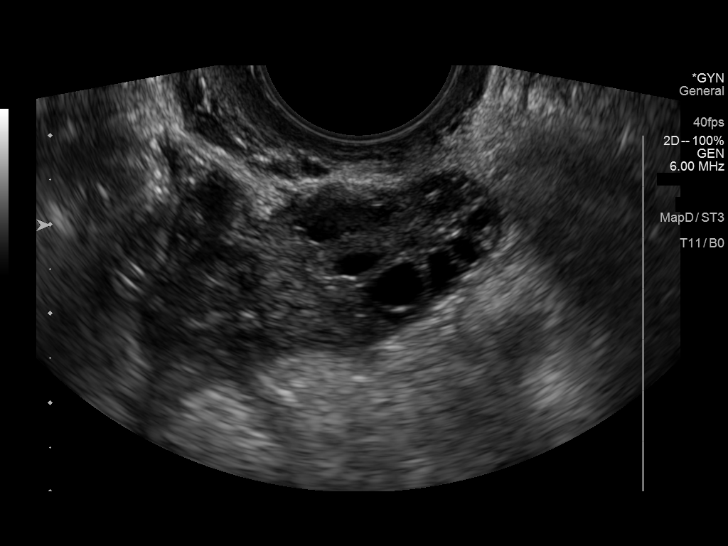
[im 43/64]
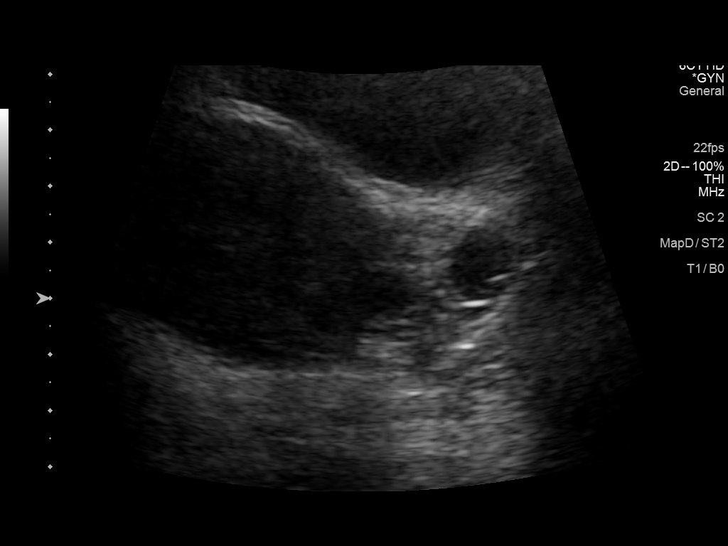
[im 48/64]
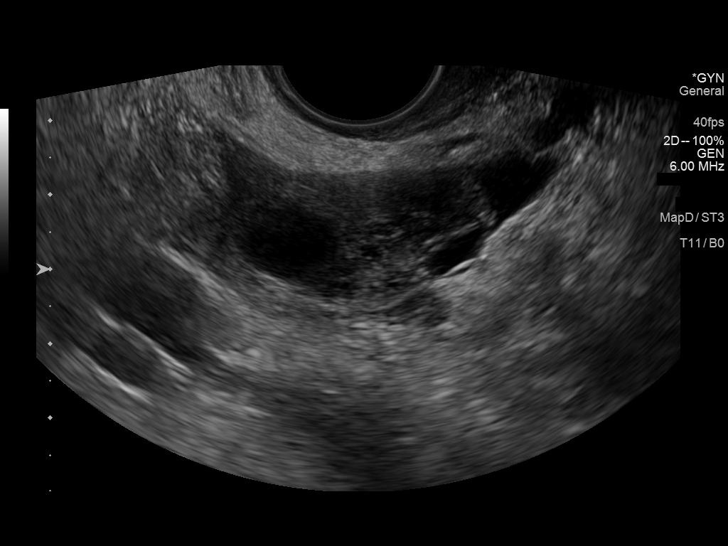
[im 53/64]
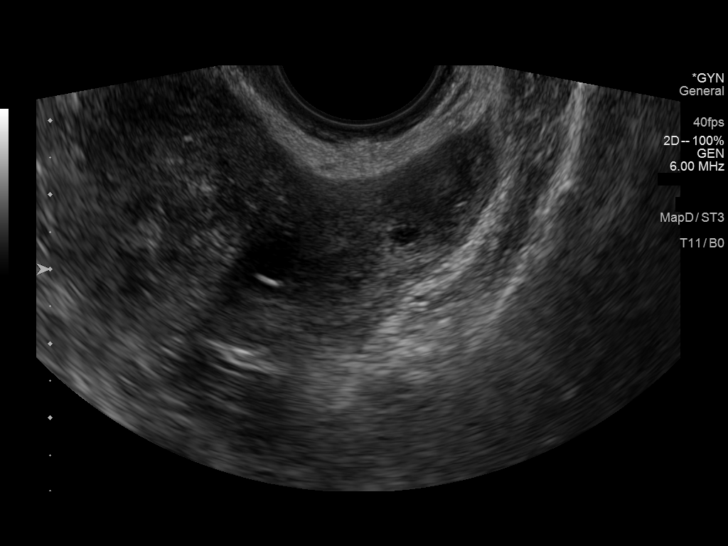
[im 58/64]
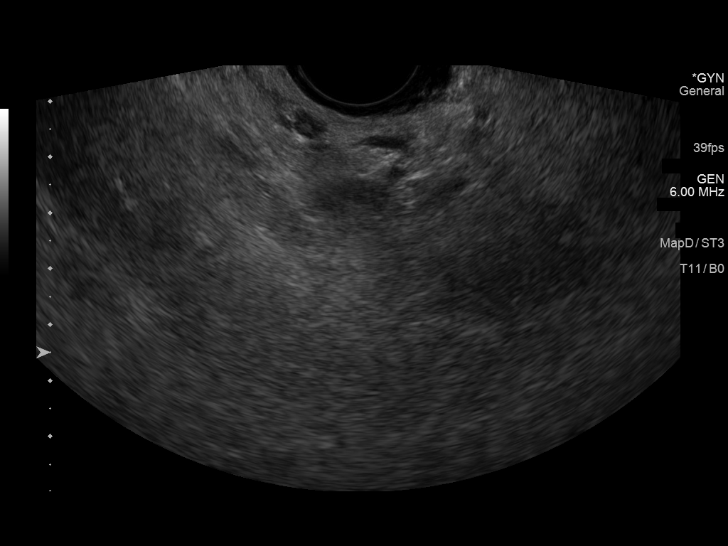
[im 64/64]
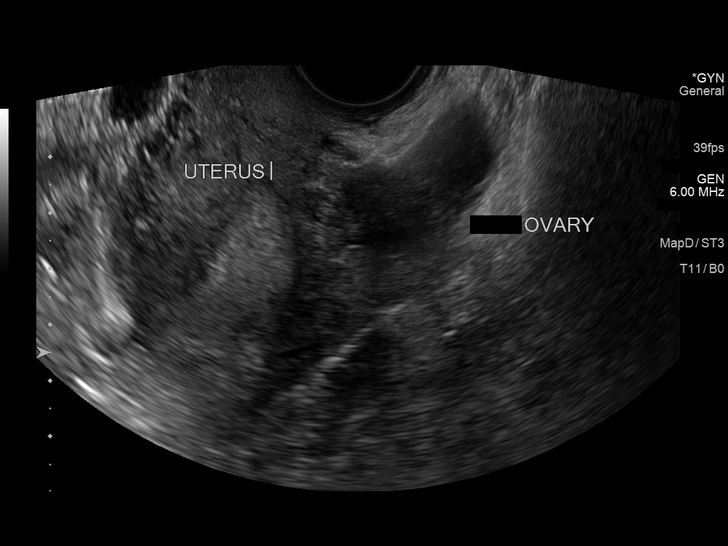

[13 of 25 positions shown; findings below may reference images not displayed]

FINDINGS: Uterus

Measurements: 8.1 x 4.4 x 4.5 cm = volume: 83 mL. Uterus is
retroflexed. There is a 2.8 x 2.5 x 2.8 cm heterogeneously
hypoechoic subserosal fundal fibroid noted.

Endometrium

Thickness: 8 mm.  No focal abnormality visualized.

Right ovary

Measurements: 3.4 x 1.6 x 3.0 cm = volume: 8.5 mL. Normal
appearance/no adnexal mass.

Left ovary

Measurements: 2.9 x 4.3 x 2.1 cm = volume: 14 mL. Trace anechoic
free fluid noted in the posterior cul-de-sac

Other findings

No abnormal free fluid.
IMPRESSION: 2.8 cm subserosal uterine fibroid is noted.

Uterine retroflexion.

Trace anechoic pelvic free fluid is likely physiologic in this
reproductive age female.

Otherwise unremarkable pelvic ultrasound.

## 2021-08-24 ENCOUNTER — Telehealth: Payer: 59 | Admitting: Obstetrics and Gynecology

## 2021-08-24 ENCOUNTER — Telehealth (INDEPENDENT_AMBULATORY_CARE_PROVIDER_SITE_OTHER): Payer: 59 | Admitting: Obstetrics and Gynecology

## 2021-08-24 DIAGNOSIS — O0992 Supervision of high risk pregnancy, unspecified, second trimester: Secondary | ICD-10-CM

## 2021-08-24 DIAGNOSIS — O10912 Unspecified pre-existing hypertension complicating pregnancy, second trimester: Secondary | ICD-10-CM

## 2021-08-24 DIAGNOSIS — O43192 Other malformation of placenta, second trimester: Secondary | ICD-10-CM

## 2021-08-24 DIAGNOSIS — O24410 Gestational diabetes mellitus in pregnancy, diet controlled: Secondary | ICD-10-CM

## 2021-08-24 DIAGNOSIS — Z86711 Personal history of pulmonary embolism: Secondary | ICD-10-CM

## 2021-08-24 DIAGNOSIS — O43199 Other malformation of placenta, unspecified trimester: Secondary | ICD-10-CM

## 2021-08-24 DIAGNOSIS — O99112 Other diseases of the blood and blood-forming organs and certain disorders involving the immune mechanism complicating pregnancy, second trimester: Secondary | ICD-10-CM

## 2021-08-24 DIAGNOSIS — Z7901 Long term (current) use of anticoagulants: Secondary | ICD-10-CM

## 2021-08-24 DIAGNOSIS — O09522 Supervision of elderly multigravida, second trimester: Secondary | ICD-10-CM

## 2021-08-24 DIAGNOSIS — Z98891 History of uterine scar from previous surgery: Secondary | ICD-10-CM

## 2021-08-24 DIAGNOSIS — Z8639 Personal history of other endocrine, nutritional and metabolic disease: Secondary | ICD-10-CM

## 2021-08-24 DIAGNOSIS — O10919 Unspecified pre-existing hypertension complicating pregnancy, unspecified trimester: Secondary | ICD-10-CM

## 2021-08-24 DIAGNOSIS — O34212 Maternal care for vertical scar from previous cesarean delivery: Secondary | ICD-10-CM

## 2021-08-24 DIAGNOSIS — O09812 Supervision of pregnancy resulting from assisted reproductive technology, second trimester: Secondary | ICD-10-CM

## 2021-08-24 DIAGNOSIS — Z3A26 26 weeks gestation of pregnancy: Secondary | ICD-10-CM

## 2021-08-24 NOTE — Progress Notes (Signed)
TELEHEALTH OBSTETRICS VISIT ENCOUNTER NOTE  Provider location: [facility] for [place] [facility] [place] [facility]   Patient location: Home  I connected with Melissa Ruiz on 08/24/21 at  8:15 AM EST by telephone at home and verified that I am speaking with the correct person using two identifiers. Of note, unable to do video encounter due to technical difficulties.    I discussed the limitations, risks, security and privacy concerns of performing an evaluation and management service by telephone and the availability of in person appointments. I also discussed with the patient that there may be a patient responsible charge related to this service. The patient expressed understanding and agreed to proceed.  Subjective:  Melissa Ruiz is a 36 y.o. G2P0101 at [redacted]w[redacted]d being followed for ongoing prenatal care.  She is currently monitored for the following issues for this high-risk pregnancy and has History of pulmonary embolus (PE); History of DVT (deep vein thrombosis); History of goiter; Supervision of high-risk pregnancy; Endometriosis; Multigravida of advanced maternal age; GDM (gestational diabetes mellitus), class A1; HTN in pregnancy, chronic; Mixed hyperlipidemia; History of low transverse cesarean section at 29 weeks; Marginal insertion of umbilical cord affecting management of mother; Pregnancy resulting from in vitro fertilization, second trimester; and Anticoagulated on their problem list.  Patient reports  some easy bruising . Reports fetal movement. Denies any contractions, bleeding or leaking of fluid.   The following portions of the patient's history were reviewed and updated as appropriate: allergies, current medications, past family history, past medical history, past social history, past surgical history and problem list.   Objective:  Last menstrual period 10/22/2020, currently breastfeeding. General:  Alert, oriented and cooperative.   Mental  Status: Normal mood and affect perceived. Normal judgment and thought content.  Rest of physical exam deferred due to type of encounter  Assessment and Plan:  Pregnancy: G2P0101 at [redacted]w[redacted]d 1. History of cesarean section D/w herre: delivery mode later in pregnancy  2. GDM (gestational diabetes mellitus), class A1 No issues. Had some oozing with the libre 3. Will have pt d/c asa for now.   3. Marginal insertion of umbilical cord affecting management of mother F/u 1/27 suveillance growth 12/30: 26%, 645g, ac 11%, afi wnl  4. Pregnancy resulting from in vitro fertilization, second trimester S/p normal fetal echo  5. Anticoagulated Continue lovenox 40 qday  6. HTN in pregnancy, chronic No issue son no meds  7. Multigravida of advanced maternal age in second trimester  8. History of pulmonary embolus (PE)  9. Supervision of high risk pregnancy in second trimester  10. History of goiter Normal 10/22 tsh  Preterm labor symptoms and general obstetric precautions including but not limited to vaginal bleeding, contractions, leaking of fluid and fetal movement were reviewed in detail with the patient.  I discussed the assessment and treatment plan with the patient. The patient was provided an opportunity to ask questions and all were answered. The patient agreed with the plan and demonstrated an understanding of the instructions. The patient was advised to call back or seek an in-person office evaluation/go to MAU at [facility] for any urgent or concerning symptoms. Please refer to After Visit Summary for other counseling recommendations.   I provided 7 minutes of non-face-to-face time during this encounter.  Return in about 2 weeks (around 09/07/2021) for in person or virtual, md visit, high risk ob.  Future Appointments  Date Time Provider [place]  09/04/2021  7:30 AM [facility] NURSE [facility]  [facility]  09/04/2021  7:45 AM [facility] US5 [facility]US [place]  09/18/2021  1:20 PM  Tobb, Godfrey Pick, DO [facility] None    Aletha Halim, MD Center for [place]

## 2021-08-25 ENCOUNTER — Other Ambulatory Visit (HOSPITAL_COMMUNITY): Payer: Self-pay

## 2021-09-04 ENCOUNTER — Other Ambulatory Visit: Payer: Self-pay | Admitting: *Deleted

## 2021-09-04 ENCOUNTER — Ambulatory Visit: Payer: 59 | Attending: Obstetrics and Gynecology

## 2021-09-04 ENCOUNTER — Ambulatory Visit: Payer: 59 | Admitting: *Deleted

## 2021-09-04 ENCOUNTER — Other Ambulatory Visit: Payer: Self-pay

## 2021-09-04 VITALS — BP 137/79 | HR 112

## 2021-09-04 DIAGNOSIS — Z86711 Personal history of pulmonary embolism: Secondary | ICD-10-CM | POA: Insufficient documentation

## 2021-09-04 DIAGNOSIS — Z86718 Personal history of other venous thrombosis and embolism: Secondary | ICD-10-CM

## 2021-09-04 DIAGNOSIS — Z3A28 28 weeks gestation of pregnancy: Secondary | ICD-10-CM | POA: Diagnosis not present

## 2021-09-04 DIAGNOSIS — O24410 Gestational diabetes mellitus in pregnancy, diet controlled: Secondary | ICD-10-CM

## 2021-09-04 DIAGNOSIS — O09523 Supervision of elderly multigravida, third trimester: Secondary | ICD-10-CM

## 2021-09-04 DIAGNOSIS — O10912 Unspecified pre-existing hypertension complicating pregnancy, second trimester: Secondary | ICD-10-CM | POA: Insufficient documentation

## 2021-09-04 DIAGNOSIS — O34219 Maternal care for unspecified type scar from previous cesarean delivery: Secondary | ICD-10-CM | POA: Insufficient documentation

## 2021-09-04 DIAGNOSIS — Z98891 History of uterine scar from previous surgery: Secondary | ICD-10-CM | POA: Insufficient documentation

## 2021-09-04 DIAGNOSIS — O43199 Other malformation of placenta, unspecified trimester: Secondary | ICD-10-CM | POA: Insufficient documentation

## 2021-09-04 DIAGNOSIS — O09522 Supervision of elderly multigravida, second trimester: Secondary | ICD-10-CM | POA: Insufficient documentation

## 2021-09-04 DIAGNOSIS — O10012 Pre-existing essential hypertension complicating pregnancy, second trimester: Secondary | ICD-10-CM | POA: Diagnosis not present

## 2021-09-04 DIAGNOSIS — O24419 Gestational diabetes mellitus in pregnancy, unspecified control: Secondary | ICD-10-CM

## 2021-09-04 DIAGNOSIS — O10913 Unspecified pre-existing hypertension complicating pregnancy, third trimester: Secondary | ICD-10-CM

## 2021-09-07 ENCOUNTER — Other Ambulatory Visit (HOSPITAL_COMMUNITY): Payer: Self-pay

## 2021-09-07 ENCOUNTER — Telehealth (INDEPENDENT_AMBULATORY_CARE_PROVIDER_SITE_OTHER): Payer: 59 | Admitting: Obstetrics and Gynecology

## 2021-09-07 ENCOUNTER — Encounter: Payer: Self-pay | Admitting: Obstetrics and Gynecology

## 2021-09-07 VITALS — BP 113/81

## 2021-09-07 DIAGNOSIS — O99323 Drug use complicating pregnancy, third trimester: Secondary | ICD-10-CM

## 2021-09-07 DIAGNOSIS — O09523 Supervision of elderly multigravida, third trimester: Secondary | ICD-10-CM

## 2021-09-07 DIAGNOSIS — O163 Unspecified maternal hypertension, third trimester: Secondary | ICD-10-CM

## 2021-09-07 DIAGNOSIS — Z86711 Personal history of pulmonary embolism: Secondary | ICD-10-CM

## 2021-09-07 DIAGNOSIS — O34219 Maternal care for unspecified type scar from previous cesarean delivery: Secondary | ICD-10-CM

## 2021-09-07 DIAGNOSIS — O09813 Supervision of pregnancy resulting from assisted reproductive technology, third trimester: Secondary | ICD-10-CM

## 2021-09-07 DIAGNOSIS — N736 Female pelvic peritoneal adhesions (postinfective): Secondary | ICD-10-CM

## 2021-09-07 DIAGNOSIS — O43199 Other malformation of placenta, unspecified trimester: Secondary | ICD-10-CM

## 2021-09-07 DIAGNOSIS — O24410 Gestational diabetes mellitus in pregnancy, diet controlled: Secondary | ICD-10-CM

## 2021-09-07 DIAGNOSIS — Z3A28 28 weeks gestation of pregnancy: Secondary | ICD-10-CM

## 2021-09-07 DIAGNOSIS — Z98891 History of uterine scar from previous surgery: Secondary | ICD-10-CM

## 2021-09-07 DIAGNOSIS — Z7901 Long term (current) use of anticoagulants: Secondary | ICD-10-CM

## 2021-09-07 DIAGNOSIS — O43193 Other malformation of placenta, third trimester: Secondary | ICD-10-CM

## 2021-09-07 NOTE — Progress Notes (Signed)
TELEHEALTH OBSTETRICS VISIT ENCOUNTER NOTE  Provider location: [facility] for [place] [facility] [place] [facility]   Patient location: Home  I connected with Jason Fila Cadott on 09/07/21 at  1:15 PM EST by telephone at home and verified that I am speaking with the correct person using two identifiers. Of note, unable to do video encounter due to technical difficulties.    I discussed the limitations, risks, security and privacy concerns of performing an evaluation and management service by telephone and the availability of in person appointments. I also discussed with the patient that there may be a patient responsible charge related to this service. The patient expressed understanding and agreed to proceed.  Subjective:  Melissa Ruiz is a 36 y.o. G2P0101 at [redacted]w[redacted]d being followed for ongoing prenatal care.  She is currently monitored for the following issues for this high-risk pregnancy and has History of pulmonary embolus (PE); History of DVT (deep vein thrombosis); History of goiter; Supervision of high-risk pregnancy; Endometriosis; Multigravida of advanced maternal age; GDM (gestational diabetes mellitus), class A1; HTN in pregnancy, chronic; Mixed hyperlipidemia; History of low transverse cesarean section at 29 weeks; Marginal insertion of umbilical cord affecting management of mother; Pregnancy resulting from in vitro fertilization, second trimester; and Anticoagulated on their problem list.  Patient reports no complaints. Reports fetal movement. Denies any contractions, bleeding or leaking of fluid.   The following portions of the patient's history were reviewed and updated as appropriate: allergies, current medications, past family history, past medical history, past social history, past surgical history and problem list.   Objective:  Blood pressure 113/81, last menstrual period 10/22/2020, currently breastfeeding. General:  Alert, oriented and cooperative.    Mental Status: Normal mood and affect perceived. Normal judgment and thought content.  Rest of physical exam deferred due to type of encounter  Assessment and Plan:  Pregnancy: G2P0101 at [redacted]w[redacted]d 1. History of cesarean section Desires repeat. Pt states she had a laparoscopy and LOA and left salpingectomy since her last pregnancy and adhesions noted. Request made to Dr. Kerin Perna for op note   2. GDM (gestational diabetes mellitus), class A1 Normal CBGs   3. Marginal insertion of umbilical cord affecting management of mother Continue with qmonth growth u/s, next one on 2/24 (1/27: 31%, 1178gm, ac 31%)   4. Pregnancy resulting from in vitro fertilization, second trimester S/p normal fetal echo on 12/1   5. Anticoagulated Continue lovenox 40 qday   6. HTN in pregnancy, chronic No issue son no meds   7. Multigravida of advanced maternal age in second trimester   8. History of pulmonary embolus (PE)   9. Supervision of high risk pregnancy in second trimester   10. History of goiter Normal 10/22 tsh  11. Pregnancy Needs third trimester cbc, rpr, hiv. I told her she can have it done when she comes for her next u/s if she likes or any time before  Preterm labor symptoms and general obstetric precautions including but not limited to vaginal bleeding, contractions, leaking of fluid and fetal movement were reviewed in detail with the patient.  I discussed the assessment and treatment plan with the patient. The patient was provided an opportunity to ask questions and all were answered. The patient agreed with the plan and demonstrated an understanding of the instructions. The patient was advised to call back or seek an in-person office evaluation/go to MAU at [facility] for any urgent or concerning symptoms. Please refer to After Visit Summary  for other counseling recommendations.   I provided 10 minutes of non-face-to-face time during this encounter.  Return in about  2 weeks (around 09/21/2021) for high risk ob, md visit, in person or virtual.  Future Appointments  Date Time Provider [place]  09/16/2021 10:00 AM [facility] LAB [facility]  09/18/2021  1:20 PM Tobb, Kardie, DO CVD-[facility] None  09/21/2021  3:15 PM Anyanwu, Sallyanne Havers, MD [facility]  10/02/2021  7:45 AM [facility] NURSE [facility]  10/02/2021  8:00 AM W[facility] US1 W[facility]-M[facility]US [place], MD Center for [facility], [place]

## 2021-09-08 ENCOUNTER — Other Ambulatory Visit (HOSPITAL_COMMUNITY): Payer: Self-pay

## 2021-09-12 DIAGNOSIS — N736 Female pelvic peritoneal adhesions (postinfective): Secondary | ICD-10-CM | POA: Insufficient documentation

## 2021-09-15 ENCOUNTER — Other Ambulatory Visit: Payer: Self-pay | Admitting: Obstetrics & Gynecology

## 2021-09-15 DIAGNOSIS — O10919 Unspecified pre-existing hypertension complicating pregnancy, unspecified trimester: Secondary | ICD-10-CM

## 2021-09-15 DIAGNOSIS — O26813 Pregnancy related exhaustion and fatigue, third trimester: Secondary | ICD-10-CM

## 2021-09-15 DIAGNOSIS — O99013 Anemia complicating pregnancy, third trimester: Secondary | ICD-10-CM

## 2021-09-15 DIAGNOSIS — D509 Iron deficiency anemia, unspecified: Secondary | ICD-10-CM

## 2021-09-15 DIAGNOSIS — O24410 Gestational diabetes mellitus in pregnancy, diet controlled: Secondary | ICD-10-CM

## 2021-09-15 DIAGNOSIS — Z3A30 30 weeks gestation of pregnancy: Secondary | ICD-10-CM

## 2021-09-15 DIAGNOSIS — O0992 Supervision of high risk pregnancy, unspecified, second trimester: Secondary | ICD-10-CM

## 2021-09-15 NOTE — Progress Notes (Signed)
Patient will get third trimester labs on 09/16/2021, future orders placed. She also wanted a ferritin, iron, TIBC level checked due to increased fatigue, these were added.  1. GDM (gestational diabetes mellitus), class A1 - Hemoglobin A1c; Future  2. HTN in pregnancy, chronic - Comprehensive metabolic panel; Future  3. Fatigue during pregnancy in third trimester - CBC; Future - Ferritin; Future - Iron and TIBC; Future  4. [redacted] weeks gestation of pregnancy 5. Supervision of high risk pregnancy in second trimester - CBC; Future - RPR; Future - HIV Antibody (routine testing w rflx); Future  Will follow up all labs and discuss results during our upcoming visit on 09/21/2021.    [name], MD, [place] for [employer], [place]

## 2021-09-16 ENCOUNTER — Other Ambulatory Visit: Payer: Self-pay

## 2021-09-16 ENCOUNTER — Other Ambulatory Visit: Payer: 59

## 2021-09-16 DIAGNOSIS — O0992 Supervision of high risk pregnancy, unspecified, second trimester: Secondary | ICD-10-CM

## 2021-09-16 DIAGNOSIS — O10919 Unspecified pre-existing hypertension complicating pregnancy, unspecified trimester: Secondary | ICD-10-CM | POA: Diagnosis not present

## 2021-09-16 DIAGNOSIS — O24410 Gestational diabetes mellitus in pregnancy, diet controlled: Secondary | ICD-10-CM | POA: Diagnosis not present

## 2021-09-16 DIAGNOSIS — O26813 Pregnancy related exhaustion and fatigue, third trimester: Secondary | ICD-10-CM

## 2021-09-16 DIAGNOSIS — Z3A30 30 weeks gestation of pregnancy: Secondary | ICD-10-CM | POA: Diagnosis not present

## 2021-09-17 LAB — COMPREHENSIVE METABOLIC PANEL
ALT: 25 IU/L (ref 0–32)
AST: 38 IU/L (ref 0–40)
Albumin/Globulin Ratio: 1.3 (ref 1.2–2.2)
Albumin: 3.5 g/dL — ABNORMAL LOW (ref 3.8–4.8)
Alkaline Phosphatase: 111 IU/L (ref 44–121)
BUN/Creatinine Ratio: 9 (ref 9–23)
BUN: 6 mg/dL (ref 6–20)
Bilirubin Total: 0.4 mg/dL (ref 0.0–1.2)
CO2: 19 mmol/L — ABNORMAL LOW (ref 20–29)
Calcium: 9.4 mg/dL (ref 8.7–10.2)
Chloride: 103 mmol/L (ref 96–106)
Creatinine, Ser: 0.68 mg/dL (ref 0.57–1.00)
Globulin, Total: 2.6 g/dL (ref 1.5–4.5)
Glucose: 132 mg/dL — ABNORMAL HIGH (ref 70–99)
Potassium: 4 mmol/L (ref 3.5–5.2)
Sodium: 134 mmol/L (ref 134–144)
Total Protein: 6.1 g/dL (ref 6.0–8.5)
eGFR: 115 mL/min/{1.73_m2} (ref >59)

## 2021-09-17 LAB — CBC
Hematocrit: 30.6 % — ABNORMAL LOW (ref 34.0–46.6)
Hemoglobin: 10.2 g/dL — ABNORMAL LOW (ref 11.1–15.9)
MCH: 29.5 pg (ref 26.6–33.0)
MCHC: 33.3 g/dL (ref 31.5–35.7)
MCV: 88 fL (ref 79–97)
Platelets: 287 10*3/uL (ref 150–450)
RBC: 3.46 x10E6/uL — ABNORMAL LOW (ref 3.77–5.28)
RDW: 13.1 % (ref 11.7–15.4)
WBC: 6.7 10*3/uL (ref 3.4–10.8)

## 2021-09-17 LAB — HEMOGLOBIN A1C
Est. average glucose Bld gHb Est-mCnc: 117 mg/dL
Hgb A1c MFr Bld: 5.7 % — ABNORMAL HIGH (ref 4.8–5.6)

## 2021-09-17 LAB — FERRITIN: Ferritin: 9 ng/mL — ABNORMAL LOW (ref 15–150)

## 2021-09-17 LAB — RPR: RPR Ser Ql: NONREACTIVE

## 2021-09-17 LAB — IRON AND TIBC
Iron Saturation: 7 % — CL (ref 15–55)
Iron: 41 ug/dL (ref 27–159)
Total Iron Binding Capacity: 601 ug/dL (ref 250–450)
UIBC: 560 ug/dL — ABNORMAL HIGH (ref 131–425)

## 2021-09-17 LAB — HIV ANTIBODY (ROUTINE TESTING W REFLEX): HIV Screen 4th Generation wRfx: NONREACTIVE

## 2021-09-18 ENCOUNTER — Other Ambulatory Visit: Payer: Self-pay

## 2021-09-18 ENCOUNTER — Encounter: Payer: Self-pay | Admitting: Cardiology

## 2021-09-18 ENCOUNTER — Ambulatory Visit (INDEPENDENT_AMBULATORY_CARE_PROVIDER_SITE_OTHER): Payer: 59 | Admitting: Cardiology

## 2021-09-18 VITALS — BP 138/90 | HR 112 | Ht 65.0 in | Wt 169.5 lb

## 2021-09-18 DIAGNOSIS — O24410 Gestational diabetes mellitus in pregnancy, diet controlled: Secondary | ICD-10-CM

## 2021-09-18 DIAGNOSIS — O99013 Anemia complicating pregnancy, third trimester: Secondary | ICD-10-CM | POA: Insufficient documentation

## 2021-09-18 DIAGNOSIS — O10919 Unspecified pre-existing hypertension complicating pregnancy, unspecified trimester: Secondary | ICD-10-CM | POA: Diagnosis not present

## 2021-09-18 DIAGNOSIS — E782 Mixed hyperlipidemia: Secondary | ICD-10-CM | POA: Diagnosis not present

## 2021-09-18 DIAGNOSIS — D509 Iron deficiency anemia, unspecified: Secondary | ICD-10-CM | POA: Insufficient documentation

## 2021-09-18 NOTE — Progress Notes (Signed)
Cardio-Obstetrics Clinic  Follow Up Note   Date:  09/20/2021   ID:  Melissa Ruiz, Melissa Ruiz, MRN [id number]  PCP:  Billie Ruddy, MD   [facility] Providers Cardiologist:  Berniece Salines, DO  Electrophysiologist:  None        Referring MD: Billie Ruddy, MD   Chief Complaint: "I am doing better":  History of Present Illness:    [name] is a 36 y.o. female [[id number]]  who returns for follow up of hypertension and pregnancy as well as gestational diabetes.  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  She initially started on antihypertensive medications but was able to wean off.  She also has history of PE/DVT which was provoked and she completed 6 weeks of Xarelto.  She is not currently on any hypertensive medications.  Patient reports that her blood sugars are diet controlled and she is doing well with her gestational diabetes.  Regarding her gestational hypertension she reports that most of her blood pressures are in the 110s/80s.  She does occasionally have elevated blood pressures into the 130s/90s.  She reports she has also noticed elevation in her pulse rate especially with activity up to the 110s which resolves with rest.  Patient denies any chest pain, shortness of breath, change in vision, right upper quadrant pain.  She does acknowledge mild swelling in her lower extremities when she is on her feet all day.  Patient reports that she was initially taking aspirin but she is also on Lovenox for history of PE and has been bruising considerably.  She discontinued the aspirin but is willing to restart this medication.   Prior CV Studies Reviewed: The following studies were reviewed today:   Past Medical History:  Diagnosis Date   Anemia    DVT (deep venous thrombosis) ([place]) 2017   Endometriosis 01/08/2021   Essential hypertension 09/16/2014   History of placenta previa 05/14/2021   Not seen on  2022 pregnancy   Hyperlipidemia    Pregnancy resulting from in vitro fertilization, antepartum 04/12/2018   Patient of Dr. Kerin Perna.  EDD 11/10/2018 by IVF dating.  [x]  fetal echo: normal except known PACs   Pulmonary embolism ([place]) 09/2015   Vitamin D deficiency 08/12/2017    Past Surgical History:  Procedure Laterality Date   CESAREAN SECTION N/A 08/27/2018   Procedure: CESAREAN SECTION;  Surgeon: Woodroe Mode, MD;  Location: [place];  Service: Obstetrics;  Laterality: N/A;   CHOLECYSTECTOMY     DIAGNOSTIC LAPAROSCOPY  11/2020   LOA, right salpingectoy      OB History     Gravida  2   Para  1   Term  0   Preterm  1   AB  0   Living  1      SAB  0   IAB  0   Ectopic  0   Multiple  0   Live Births  1               Current Medications: Current Meds  Medication Sig   Continuous Blood Gluc Sensor (FREESTYLE LIBRE 3 SENSOR) MISC Use as directed every 14 days.   enoxaparin (LOVENOX) 40 MG/0.4ML injection Inject 0.4 mLs (40 mg total) into the skin daily.   famotidine (PEPCID) 20 MG tablet Take 20 mg by mouth 2 (two) times daily.   Prenatal Vit-Fe Fumarate-FA (MULTIVITAMIN-PRENATAL) 27-0.8 MG TABS tablet Take 1 tablet by mouth daily  at 12 noon.     Allergies:   Patient has no known allergies.   Social History   Socioeconomic History   Marital status: Married    Spouse name: Not on file   Number of children: Not on file   Years of education: Not on file   Highest education level: Not on file  Occupational History   Not on file  Tobacco Use   Smoking status: Never   Smokeless tobacco: Never  Vaping Use   Vaping Use: Never used  Substance and Sexual Activity   Alcohol use: Not Currently    Alcohol/week: 0.0 standard drinks    Comment: rarely   Drug use: No   Sexual activity: Yes    Birth control/protection: None  Other Topics Concern   Not on file  Social History Narrative   Not on file   Social Determinants of Health    Financial Resource Strain: Low Risk    Difficulty of Paying Living Expenses: Not hard at all  Food Insecurity: No Food Insecurity   Worried About Charity fundraiser in the Last Year: Never true   [place] in the Last Year: Never true  Transportation Needs: No Transportation Needs   Lack of Transportation (Medical): No   Lack of Transportation (Non-Medical): No  Physical Activity: Not on file  Stress: Not on file  Social Connections: Not on file      Family History  Problem Relation Age of Onset   Hypertension Mother    Hypertension Father    Diabetes Father    Cancer Father       ROS:   Please see the history of present illness.     All other systems reviewed and are negative.   Labs/EKG Reviewed:    EKG:   EKG is was not ordered today.    Recent Labs: 05/14/2021: TSH 0.609 09/16/2021: ALT 25; BUN 6; Creatinine, Ser 0.68; Hemoglobin 10.2; Platelets 287; Potassium 4.0; Sodium 134   Recent Lipid Panel Lab Results  Component Value Date/Time   CHOL 202 (H) 10/29/2020 10:49 AM   TRIG 73.0 10/29/2020 10:49 AM   HDL 63.60 10/29/2020 10:49 AM   CHOLHDL 3 10/29/2020 10:49 AM   LDLCALC 123 (H) 10/29/2020 10:49 AM    Physical Exam:    VS:  BP 138/90    Pulse (!) 112    Ht 5\' 5"  (1.651 m)    Wt 169 lb 8 oz (76.9 kg)    LMP 10/22/2020    SpO2 99%    BMI 28.21 kg/m     Wt Readings from Last 3 Encounters:  09/18/21 169 lb 8 oz (76.9 kg)  08/07/21 167 lb (75.8 kg)  07/27/21 167 lb (75.8 kg)     GEN:  Well nourished, well developed in no acute distress HEENT: Normal NECK: No JVD; No carotid bruits LYMPHATICS: No lymphadenopathy CARDIAC: RRR, no murmurs, rubs, gallops RESPIRATORY:  Clear to auscultation without rales, wheezing or rhonchi  ABDOMEN: Soft, non-tender, non-distended MUSCULOSKELETAL:  No edema; No deformity  SKIN: Warm and dry NEUROLOGIC:  Alert and oriented x 3 PSYCHIATRIC:  Normal affect    Risk Assessment/Risk Calculators:     CARPREG  II Risk Prediction Index Score:  1.  The patient's risk for a primary cardiac event is 5%.            ASSESSMENT & PLAN:    Gestational diabetes Chronic hypertension in pregnancy Hyperlipidemia  Patient blood pressure mildly elevated today  but reports that most of her blood pressures are within goal at home.  She reports that her systolic blood pressures are typically in the 110s mmHg although if she is up all day or stressed she does see systolic blood pressures in the 130s mmHg..  Although patient is having some elevated blood pressures majority of her blood pressures are at goal.  We will continue to monitor and not initiate antihypertensive medications at this time.  Recommended patient restart her 81 mg aspirin daily and she reports that she will do this.  Plan on follow-up appointment with her just prior to delivery as well as after delivery.  Discussed with patient that one of the most at risk times for adverse cardiovascular outcomes is in the postpartum period.  Patient has history of hyperlipidemia but is not currently taking her statin.  Continue to recommend against Crestor use while breast-feeding.  Repeat lipid panel in the postpartum setting also plan on getting LP (a).   Patient seen and examined, note reviewed with the signed resident. I personally reviewed laboratory data, imaging studies and relevant notes. I independently examined the patient and formulated the important aspects of the plan. I have personally discussed the plan with the patient and/or family.  I agree with the above plan. Patient Instructions  Medication Instructions:  Your physician recommends that you continue on your current medications as directed. Please refer to the Current Medication list given to you today.  *If you need a refill on your cardiac medications before your next appointment, please call your pharmacy*   Lab Work: None If you have labs (blood work) drawn today and your tests are  completely normal, you will receive your results only by: [place] (if you have MyChart) OR A paper copy in the mail If you have any lab test that is abnormal or we need to change your treatment, we will call you to review the results.   Testing/Procedures: None   Follow-Up: At [facility], you and your health needs are our priority.  As part of our continuing mission to provide you with exceptional heart care, we have created designated Provider Care Teams.  These Care Teams include your primary Cardiologist (physician) and Advanced Practice Providers (APPs -  Physician Assistants and Nurse Practitioners) who all work together to provide you with the care you need, when you need it.  We recommend signing up for the patient portal called "MyChart".  Sign up information is provided on this After Visit Summary.  MyChart is used to connect with patients for Virtual Visits (Telemedicine).  Patients are able to view lab/test results, encounter notes, upcoming appointments, etc.  Non-urgent messages can be sent to your provider as well.   To learn more about what you can do with MyChart, go to [email or web address].    Your next appointment:    Last week in April  The format for your next appointment:   Virtual Visit   Provider:   Berniece Salines  [facility] [address], [place], [place] [address]     Other Instructions     Dispo:  No follow-ups on file.   Medication Adjustments/Labs and Tests Ordered: Current medicines are reviewed at length with the patient today.  Concerns regarding medicines are outlined above.  Tests Ordered: No orders of the defined types were placed in this encounter.  Medication Changes: No orders of the defined types were placed in this encounter.

## 2021-09-18 NOTE — Patient Instructions (Signed)
Medication Instructions:  Your physician recommends that you continue on your current medications as directed. Please refer to the Current Medication list given to you today.  *If you need a refill on your cardiac medications before your next appointment, please call your pharmacy*   Lab Work: None If you have labs (blood work) drawn today and your tests are completely normal, you will receive your results only by: [place] (if you have MyChart) OR A paper copy in the mail If you have any lab test that is abnormal or we need to change your treatment, we will call you to review the results.   Testing/Procedures: None   Follow-Up: At [facility], you and your health needs are our priority.  As part of our continuing mission to provide you with exceptional heart care, we have created designated Provider Care Teams.  These Care Teams include your primary Cardiologist (physician) and Advanced Practice Providers (APPs -  Physician Assistants and Nurse Practitioners) who all work together to provide you with the care you need, when you need it.  We recommend signing up for the patient portal called "MyChart".  Sign up information is provided on this After Visit Summary.  MyChart is used to connect with patients for Virtual Visits (Telemedicine).  Patients are able to view lab/test results, encounter notes, upcoming appointments, etc.  Non-urgent messages can be sent to your provider as well.   To learn more about what you can do with MyChart, go to [email or web address].    Your next appointment:    Last week in April  The format for your next appointment:   Virtual Visit   Provider:   Berniece Salines  [facility] [address] [place], [place] [address]     Other Instructions

## 2021-09-18 NOTE — Addendum Note (Signed)
Addended by: [name] A on: 09/18/2021 10:36 AM   Modules accepted: Orders

## 2021-09-20 ENCOUNTER — Encounter: Payer: Self-pay | Admitting: Cardiology

## 2021-09-21 ENCOUNTER — Telehealth (INDEPENDENT_AMBULATORY_CARE_PROVIDER_SITE_OTHER): Payer: 59 | Admitting: Obstetrics & Gynecology

## 2021-09-21 ENCOUNTER — Encounter: Payer: Self-pay | Admitting: Obstetrics & Gynecology

## 2021-09-21 ENCOUNTER — Telehealth: Payer: 59 | Admitting: Obstetrics and Gynecology

## 2021-09-21 VITALS — BP 119/78

## 2021-09-21 DIAGNOSIS — Z86711 Personal history of pulmonary embolism: Secondary | ICD-10-CM

## 2021-09-21 DIAGNOSIS — Z3A30 30 weeks gestation of pregnancy: Secondary | ICD-10-CM

## 2021-09-21 DIAGNOSIS — D509 Iron deficiency anemia, unspecified: Secondary | ICD-10-CM

## 2021-09-21 DIAGNOSIS — O43193 Other malformation of placenta, third trimester: Secondary | ICD-10-CM

## 2021-09-21 DIAGNOSIS — Z98891 History of uterine scar from previous surgery: Secondary | ICD-10-CM

## 2021-09-21 DIAGNOSIS — O10919 Unspecified pre-existing hypertension complicating pregnancy, unspecified trimester: Secondary | ICD-10-CM

## 2021-09-21 DIAGNOSIS — O99013 Anemia complicating pregnancy, third trimester: Secondary | ICD-10-CM

## 2021-09-21 DIAGNOSIS — Z0289 Encounter for other administrative examinations: Secondary | ICD-10-CM

## 2021-09-21 DIAGNOSIS — O24410 Gestational diabetes mellitus in pregnancy, diet controlled: Secondary | ICD-10-CM

## 2021-09-21 DIAGNOSIS — O10913 Unspecified pre-existing hypertension complicating pregnancy, third trimester: Secondary | ICD-10-CM

## 2021-09-21 DIAGNOSIS — O43199 Other malformation of placenta, unspecified trimester: Secondary | ICD-10-CM

## 2021-09-21 DIAGNOSIS — O0993 Supervision of high risk pregnancy, unspecified, third trimester: Secondary | ICD-10-CM

## 2021-09-21 NOTE — Patient Instructions (Signed)
Return to office for any scheduled appointments. Call the office or go to the MAU at [facility] if:  You begin to have strong, frequent contractions  Your water breaks.  Sometimes it is a big gush of fluid, sometimes it is just a trickle that keeps getting your panties wet or running down your legs  You have vaginal bleeding.  It is normal to have a small amount of spotting if your cervix was checked.   You do not feel your baby moving like normal.  If you do not, get something to eat and drink and lay down and focus on feeling your baby move.   If your baby is still not moving like normal, you should call the office or go to MAU.  Any other obstetric concerns.   

## 2021-09-21 NOTE — Progress Notes (Signed)
OBSTETRICS PRENATAL VIRTUAL VISIT ENCOUNTER NOTE  Provider location: [facility] [place] [facility]   Patient location: [place], where she has an appointment. Currently in a parking lot, in her car. Safe and private environment.  I connected with Melissa Ruiz on 09/21/21 at  3:15 PM EST by MyChart Video Encounter and verified that I am speaking with the correct person using two identifiers. I discussed the limitations, risks, security and privacy concerns of performing an evaluation and management service virtually and the availability of in person appointments. I also discussed with the patient that there may be a patient responsible charge related to this service. The patient expressed understanding and agreed to proceed. Subjective:  Melissa Ruiz is a 36 y.o. G2P0101 at [redacted]w[redacted]d being seen today for ongoing prenatal care.  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  Patient reports no complaints.  Contractions: Irritability. Vag. Bleeding: None.  Movement: Present. Denies any leaking of fluid.   The following portions of the patient's history were reviewed and updated as appropriate: allergies, current medications, past family history, past medical history, past social history, past surgical history and  problem list.   Objective:   Vitals:   09/21/21 1048  BP: 119/78    Fetal Status:     Movement: Present     General:  Alert, oriented and cooperative. Patient is in no acute distress.  Respiratory: Normal respiratory effort, no problems with respiration noted  Mental Status: Normal mood and affect. Normal behavior. Normal judgment and thought content.  Rest of physical exam deferred due to type of encounter  Imaging: [place] MFM OB FOLLOW UP  Result Date: 09/04/2021 ----------------------------------------------------------------------  OBSTETRICS REPORT                        (Signed Final 09/04/2021 02:16 pm) ---------------------------------------------------------------------- Patient Info  ID #:       [id number]                          D.O.B.:  September 24, 1985 (36 yrs)  Name:       Melissa Ruiz                  Visit Date: 09/04/2021 08:08 am              [place] ---------------------------------------------------------------------- Performed By  Attending:        Sander Nephew      Ref. Address:      [place]                    MD  [place], [place]  Performed By:     Eveline Keto         [facility]                                                              [facility]                                                              [facility]  Referred By:      [facility] ---------------------------------------------------------------------- Orders  #  Description                           Code        Ordered By  1  [place] MFM OB FOLLOW UP                   [phone]    [place] ----------------------------------------------------------------------  #  Order #                     Accession #                Episode #  1  [id number]                    [id number]                 [id number] ---------------------------------------------------------------------- Indications  [redacted] weeks gestation of pregnancy                 Z3A.8  Advanced maternal age multigravida 77+,         O5.522  second trimester  Medical complication of pregnancy (PE,          O26.90  DVT) on Lovenox  Hypertension - Chronic/Pre-existing (no         O10.019  meds)  Gestational diabetes in pregnancy, diet         O24.410  controlled  Previous cesarean delivery, antepartum          O34.219  Pregnancy resulting from assisted  O70.819  reproductive technology (IVF)  Poor obstetric history: Previous preterm        O09.219  delivery, antepartum (29 weeks)  Low risk NIPS  Poor obstetrical history (previa w/ recurrent   O09.299  bleeding)  Uterine fibroids affecting pregnancy in         O34.12, D25.9  second trimester, antepartum  Marginal insertion of umbilical cord affecting  O43.192  management of mother in second trimester ---------------------------------------------------------------------- Fetal Evaluation  Num Of Fetuses:          1  Fetal Heart Rate(bpm):   140  Cardiac Activity:        Observed  Presentation:            Breech  Placenta:                Posterior  P. Cord Insertion:       Marginal insertion, seen prev.  Amniotic Fluid  AFI FV:      Within normal limits  AFI Sum(cm)     %Tile       Largest Pocket(cm)  10.5            15          3.8  RUQ(cm)       RLQ(cm)       LUQ(cm)        LLQ(cm)  1.9           2.3           3.8            2.5 ---------------------------------------------------------------------- Biometry  BPD:        66  mm     G. Age:  26w 4d        3.7  %    CI:        67.43   %    70 - 86                                                          FL/HC:       21.1  %    18.8 - 20.6  HC:      257.3  mm     G. Age:  28w 0d         13  %    HC/AC:       1.09       1.05 - 1.21  AC:      236.1  mm     G. Age:  27w 6d         31  %    FL/BPD:      82.4  %     71 - 87  FL:       54.4  mm     G. Age:  28w 5d         49  %    FL/AC:       23.0  %    20 - 24  CER:      33.8  mm     G. Age:  28w 5d         59  %  LV:        2.7  mm  CM:        9.3  mm  Est. FW:    1178   gm    2 lb 10 oz     31  % ---------------------------------------------------------------------- Gestational Age  LMP:           45w 2d        Date:  10/22/20                 EDD:   07/29/21  U/S Today:     27w 6d                                        EDD:   11/28/21  Best:          28w 2d     Det. ByLoman Chroman         EDD:   11/25/21                                      (04/15/21) ---------------------------------------------------------------------- Anatomy  Cranium:               Appears normal         Aortic Arch:            Previously seen  Cavum:                 Appears normal         Ductal Arch:            Previously seen  Ventricles:            Appears normal         Diaphragm:              Appears normal  Choroid Plexus:        Previously seen        Stomach:                Appears normal, left                                                                        sided  Cerebellum:            Appears normal         Abdomen:                Previously seen  Posterior Fossa:       Appears normal         Abdominal Wall:         Previously seen  Nuchal Fold:           Not applicable (>14    Cord Vessels:           Previously seen                         wks GA)  Face:                  Orbits and profile     Kidneys:  Appear normal                         previously seen  Lips:                  Previously seen        Bladder:                Appears normal  Thoracic:              Previously seen        Spine:                  Previously seen  Heart:                 Appears normal         Upper Extremities:      Previously seen                         (4CH, axis, and                         situs)  RVOT:                  Previously seen        Lower Extremities:      Previously  seen  LVOT:                  Previously seen  Other:  Fetus appears to be female. 3VTV previously visualized. Heels/feet,          open right hand/5th digit previously visualized. ---------------------------------------------------------------------- Cervix Uterus Adnexa  Cervix  Not visualized (advanced GA >24wks)  Uterus  No abnormality visualized.  Right Ovary  Within normal limits.  Left Ovary  Within normal limits. ---------------------------------------------------------------------- Impression  Follow up growth due to marginal cord insertion and  gestational diabetes.  Normal interval growth with measurements consistent with  dates  Good fetal movement and amniotic fluid volume ---------------------------------------------------------------------- Recommendations  Follow up growth in 4 weeks. ----------------------------------------------------------------------               Sander Nephew, MD Electronically Signed Final Report   09/04/2021 02:16 pm ----------------------------------------------------------------------   Assessment and Plan:  Pregnancy: G2P0101 at [redacted]w[redacted]d 1. GDM (gestational diabetes mellitus), class A1 Reports sugars are mostly in range.  Continue diet and exercise control.  2. HTN in pregnancy, chronic Stable BP, no meds. Continue ASA. Followed by Cardiologist, Dr. Harriet Masson  3. Marginal insertion of umbilical cord affecting management of mother Follow up scans as per MFM  4. Maternal iron deficiency anemia complicating pregnancy in third trimester Iron infusions to be scheduled for patient  5. History of pulmonary embolus (PE) Continue Lovenox for prophylaxis.  6. [redacted] weeks gestation of pregnancy 7. Supervision of high risk pregnancy in third trimester Preterm labor symptoms and general obstetric precautions including but not limited to vaginal bleeding, contractions, leaking of fluid and fetal movement were reviewed in detail with the patient. I discussed the assessment  and treatment plan with the patient. The patient was provided an opportunity to ask questions and all were answered. The patient agreed with the plan and demonstrated an understanding of the instructions. The patient was advised to call back or seek an in-person office evaluation/go to [facility] at [facility] for any urgent or concerning symptoms. Please refer to After Visit Summary  for other counseling recommendations.   I provided 20 minutes of face-to-face time during this encounter.  Return in about 2 weeks (around 10/05/2021) for Virtual OB Visit.  Future Appointments  Date Time Provider [place]  09/21/2021  3:15 PM Osborne Oman, MD [facility]  10/02/2021  7:45 AM [facility] NURSE [facility]  10/02/2021  8:00 AM [facility] US1 [facility]  11/27/2021 11:00 AM Tobb, Godfrey Pick, DO [facility]    [name], MD [facility] for [employer], [place]

## 2021-09-24 ENCOUNTER — Encounter: Payer: Self-pay | Admitting: *Deleted

## 2021-09-24 NOTE — Progress Notes (Signed)
erroneous

## 2021-09-25 ENCOUNTER — Encounter (HOSPITAL_COMMUNITY)
Admission: RE | Admit: 2021-09-25 | Discharge: 2021-09-25 | Disposition: A | Discharge status: Home / Self Care | Payer: 59 | Source: Ambulatory Visit | Attending: Obstetrics & Gynecology | Admitting: Obstetrics & Gynecology

## 2021-09-25 ENCOUNTER — Other Ambulatory Visit: Payer: Self-pay

## 2021-09-25 DIAGNOSIS — D509 Iron deficiency anemia, unspecified: Secondary | ICD-10-CM | POA: Insufficient documentation

## 2021-09-25 DIAGNOSIS — O99013 Anemia complicating pregnancy, third trimester: Secondary | ICD-10-CM | POA: Diagnosis not present

## 2021-09-25 DIAGNOSIS — Z3A30 30 weeks gestation of pregnancy: Secondary | ICD-10-CM | POA: Insufficient documentation

## 2021-09-25 MED ORDER — SODIUM CHLORIDE 0.9 % IV SOLN
300.0000 mg | INTRAVENOUS | Status: DC
  Administered 2021-09-25: 300 mg via INTRAVENOUS
  Filled 2021-09-25: qty 300

## 2021-09-26 ENCOUNTER — Encounter: Payer: Self-pay | Admitting: Radiology

## 2021-09-28 ENCOUNTER — Other Ambulatory Visit (HOSPITAL_COMMUNITY): Payer: Self-pay

## 2021-09-29 ENCOUNTER — Telehealth (INDEPENDENT_AMBULATORY_CARE_PROVIDER_SITE_OTHER): Payer: 59 | Admitting: Family Medicine

## 2021-09-29 ENCOUNTER — Encounter: Payer: Self-pay | Admitting: Family Medicine

## 2021-09-29 ENCOUNTER — Other Ambulatory Visit (HOSPITAL_COMMUNITY): Payer: Self-pay

## 2021-09-29 ENCOUNTER — Telehealth: Payer: 59

## 2021-09-29 VITALS — BP 121/81

## 2021-09-29 DIAGNOSIS — O10913 Unspecified pre-existing hypertension complicating pregnancy, third trimester: Secondary | ICD-10-CM

## 2021-09-29 DIAGNOSIS — O99323 Drug use complicating pregnancy, third trimester: Secondary | ICD-10-CM

## 2021-09-29 DIAGNOSIS — O34219 Maternal care for unspecified type scar from previous cesarean delivery: Secondary | ICD-10-CM

## 2021-09-29 DIAGNOSIS — O10919 Unspecified pre-existing hypertension complicating pregnancy, unspecified trimester: Secondary | ICD-10-CM

## 2021-09-29 DIAGNOSIS — Z86718 Personal history of other venous thrombosis and embolism: Secondary | ICD-10-CM

## 2021-09-29 DIAGNOSIS — N809 Endometriosis, unspecified: Secondary | ICD-10-CM

## 2021-09-29 DIAGNOSIS — O09523 Supervision of elderly multigravida, third trimester: Secondary | ICD-10-CM

## 2021-09-29 DIAGNOSIS — O24410 Gestational diabetes mellitus in pregnancy, diet controlled: Secondary | ICD-10-CM

## 2021-09-29 DIAGNOSIS — Z7901 Long term (current) use of anticoagulants: Secondary | ICD-10-CM

## 2021-09-29 DIAGNOSIS — Z86711 Personal history of pulmonary embolism: Secondary | ICD-10-CM

## 2021-09-29 DIAGNOSIS — D509 Iron deficiency anemia, unspecified: Secondary | ICD-10-CM

## 2021-09-29 DIAGNOSIS — O43199 Other malformation of placenta, unspecified trimester: Secondary | ICD-10-CM

## 2021-09-29 DIAGNOSIS — O09812 Supervision of pregnancy resulting from assisted reproductive technology, second trimester: Secondary | ICD-10-CM

## 2021-09-29 DIAGNOSIS — O43193 Other malformation of placenta, third trimester: Secondary | ICD-10-CM

## 2021-09-29 DIAGNOSIS — O99013 Anemia complicating pregnancy, third trimester: Secondary | ICD-10-CM

## 2021-09-29 DIAGNOSIS — O0993 Supervision of high risk pregnancy, unspecified, third trimester: Secondary | ICD-10-CM

## 2021-09-29 DIAGNOSIS — O3483 Maternal care for other abnormalities of pelvic organs, third trimester: Secondary | ICD-10-CM

## 2021-09-29 DIAGNOSIS — Z98891 History of uterine scar from previous surgery: Secondary | ICD-10-CM

## 2021-09-29 DIAGNOSIS — Z3A31 31 weeks gestation of pregnancy: Secondary | ICD-10-CM

## 2021-09-29 MED ORDER — METFORMIN HCL 500 MG PO TABS
500.0000 mg | ORAL_TABLET | Freq: Two times a day (BID) | ORAL | 5 refills | Status: DC
  Filled 2021-09-29: qty 60, 30d supply, fill #0

## 2021-09-29 NOTE — Progress Notes (Signed)
I connected with Melissa Ruiz 09/29/21 at 10:15 AM EST by: MyChart video and verified that I am speaking with the correct person using two identifiers.  Patient is located at home and provider is located at [facility].     The purpose of this virtual visit is to provide medical care while limiting exposure to the novel coronavirus. I discussed the limitations, risks, security and privacy concerns of performing an evaluation and management service by MyChart video and the availability of in person appointments. I also discussed with the patient that there may be a patient responsible charge related to this service. By engaging in this virtual visit, you consent to the provision of healthcare.  Additionally, you authorize for your insurance to be billed for the services provided during this visit.  The patient expressed understanding and agreed to proceed.  The following staff members participated in the virtual visit:  Clarnce Flock, MD/MPH Attending Family Medicine Physician, [place] [facility]     PRENATAL VISIT NOTE  Subjective:  Melissa Ruiz is a 36 y.o. G2P0101 at [redacted]w[redacted]d  for MyChart Video visit for ongoing prenatal care.  She is currently monitored for the following issues for this high-risk pregnancy and has History of pulmonary embolus (PE); History of DVT (deep vein thrombosis); History of goiter; Supervision of high-risk pregnancy; Endometriosis; Multigravida of advanced maternal age; GDM (gestational diabetes mellitus), class A1; HTN in pregnancy, chronic; Mixed hyperlipidemia; History of low transverse cesarean section at 29 weeks; Marginal insertion of umbilical cord affecting management of mother; Pregnancy resulting from in vitro fertilization, second trimester; Anticoagulated; Pelvic adhesive disease; and Maternal iron deficiency anemia complicating pregnancy in third trimester on their problem  list.  Patient reports no complaints.  Contractions: Not present. Vag. Bleeding: None.  Movement: Present. Denies leaking of fluid.   The following portions of the patient's history were reviewed and updated as appropriate: allergies, current medications, past family history, past medical history, past social history, past surgical history and problem list.   Objective:   Vitals:   09/29/21 0948  BP: 121/81   Self-Obtained  Fetal Status:     Movement: Present     Assessment and Plan:  Pregnancy: G2P0101 at [redacted]w[redacted]d 1. History of cesarean section Desires repeat, sign consent at next in person visit  2. GDM (gestational diabetes mellitus), class A1 Diet controlled Using libre 3, reports frequently fastings are in high 90's and post prandials in 130's Discussed that I would recommend starting metformin 500mg  BID but she really wants to try and avoid this We agreed she would try some dietary and lifestyle changes (walking after meals) to see if this could improve her sugars If not though I have sent an rx in hand of metformin, and she is to start taking it if there is no change in a week She will also contact us so that we can start weekly antenatal testing Serial growth [place] w MFM, have been normal to date  3. Marginal insertion of umbilical cord affecting management of mother Following w MFM  4. Supervision of high risk pregnancy in third trimester BP normal on home cuff Good fetal movement Up to date on pregnancy testing  5. HTN in pregnancy, chronic Well controlled on no meds, though she reports occasional elevated diastolic values Taking ASA Discussed that if she is persistently mild range would recommend she start taking BP med  6. History of pulmonary embolus (PE) Chart reviewed, Heme  note from 12/25/2015 in [place] with the following history:  "Caryssa Elzey is a 36 YO female who has the following Hematologic History 07/2015: developed pain in RLE, thought to be  tendinitis 08/2015: had a lap-choly, was fully ambulatory, had recurrence of pain in her RLE 09/2015 -After a flight to [place] (and while on OCP) she developed sob and was found to have a R Popliteal DVT and bilateral PE -Started on Xarelto" Continue prophylactic lovenox 40 daily  7. Endometriosis S/p extensive lysis of adhesions with Dr. Kerin Perna in 2022, see op note in [facility]  8. Multigravida of advanced maternal age in third trimester   2. Pregnancy resulting from in vitro fertilization, second trimester   10. Maternal iron deficiency anemia complicating pregnancy in third trimester Hgb actually quite good at 10.2 on last check on 09/16/21, however iron studies from that day showed a ferritin of 9 She has an IV iron infusion scheduled for tomorrow  Preterm labor symptoms and general obstetric precautions including but not limited to vaginal bleeding, contractions, leaking of fluid and fetal movement were reviewed in detail with the patient.  Return in 2 weeks (on 10/13/2021) for [facility], ob visit, needs MD.  Future Appointments  Date Time Provider [place]  09/29/2021 10:15 AM Clarnce Flock, MD [facility]  09/30/2021 10:00 AM [facility] None  10/02/2021  7:45 AM [facility] NURSE [facility]  10/02/2021  8:00 AM [facility] US1 [facility]  10/14/2021 10:15 AM Radene Gunning, MD [facility]  10/29/2021 10:55 AM Harolyn Rutherford, Sallyanne Havers, MD [facility]  11/27/2021 11:00 AM Tobb, Godfrey Pick, DO [facility]     Time spent on virtual visit: 25 minutes  Clarnce Flock, MD

## 2021-09-29 NOTE — Progress Notes (Signed)
Fasting BS this morning 100.

## 2021-09-29 NOTE — Patient Instructions (Signed)

## 2021-09-30 ENCOUNTER — Other Ambulatory Visit (HOSPITAL_COMMUNITY): Payer: Self-pay

## 2021-09-30 ENCOUNTER — Other Ambulatory Visit: Payer: Self-pay

## 2021-09-30 ENCOUNTER — Encounter (HOSPITAL_COMMUNITY)
Admission: RE | Admit: 2021-09-30 | Discharge: 2021-09-30 | Disposition: A | Discharge status: Home / Self Care | Payer: 59 | Source: Ambulatory Visit | Attending: Obstetrics & Gynecology | Admitting: Obstetrics & Gynecology

## 2021-09-30 DIAGNOSIS — D509 Iron deficiency anemia, unspecified: Secondary | ICD-10-CM | POA: Diagnosis not present

## 2021-09-30 DIAGNOSIS — Z3A30 30 weeks gestation of pregnancy: Secondary | ICD-10-CM | POA: Diagnosis not present

## 2021-09-30 DIAGNOSIS — O99013 Anemia complicating pregnancy, third trimester: Secondary | ICD-10-CM

## 2021-09-30 MED ORDER — EPINEPHRINE PF 1 MG/ML IJ SOLN: 0.3000 mg | Freq: Once | INTRAMUSCULAR | Status: DC | PRN

## 2021-09-30 MED ORDER — DIPHENHYDRAMINE HCL 50 MG/ML IJ SOLN: 25.0000 mg | Freq: Once | INTRAMUSCULAR | Status: DC | PRN

## 2021-09-30 MED ORDER — SODIUM CHLORIDE 0.9 % IV BOLUS: 500.0000 mL | Freq: Once | INTRAVENOUS | Status: DC | PRN

## 2021-09-30 MED ORDER — METHYLPREDNISOLONE SODIUM SUCC 125 MG IJ SOLR: 125.0000 mg | Freq: Once | INTRAMUSCULAR | Status: DC | PRN

## 2021-09-30 MED ORDER — SODIUM CHLORIDE 0.9 % IV SOLN: INTRAVENOUS | Status: DC | PRN

## 2021-09-30 MED ORDER — ALBUTEROL SULFATE (2.5 MG/3ML) 0.083% IN NEBU: 2.5000 mg | INHALATION_SOLUTION | Freq: Once | RESPIRATORY_TRACT | Status: DC | PRN

## 2021-09-30 MED ORDER — SODIUM CHLORIDE 0.9 % IV SOLN
300.0000 mg | INTRAVENOUS | Status: DC
  Administered 2021-09-30: 300 mg via INTRAVENOUS
  Filled 2021-09-30: qty 15

## 2021-10-02 ENCOUNTER — Other Ambulatory Visit: Payer: Self-pay | Admitting: *Deleted

## 2021-10-02 ENCOUNTER — Encounter: Payer: Self-pay | Admitting: *Deleted

## 2021-10-02 ENCOUNTER — Other Ambulatory Visit: Payer: Self-pay

## 2021-10-02 ENCOUNTER — Ambulatory Visit: Payer: 59 | Attending: Obstetrics and Gynecology

## 2021-10-02 ENCOUNTER — Ambulatory Visit: Payer: 59 | Admitting: *Deleted

## 2021-10-02 VITALS — BP 126/79 | HR 99

## 2021-10-02 DIAGNOSIS — O24419 Gestational diabetes mellitus in pregnancy, unspecified control: Secondary | ICD-10-CM | POA: Insufficient documentation

## 2021-10-02 DIAGNOSIS — O09523 Supervision of elderly multigravida, third trimester: Secondary | ICD-10-CM | POA: Insufficient documentation

## 2021-10-02 DIAGNOSIS — Z98891 History of uterine scar from previous surgery: Secondary | ICD-10-CM

## 2021-10-02 DIAGNOSIS — Z86711 Personal history of pulmonary embolism: Secondary | ICD-10-CM | POA: Insufficient documentation

## 2021-10-02 DIAGNOSIS — Z3A32 32 weeks gestation of pregnancy: Secondary | ICD-10-CM | POA: Diagnosis not present

## 2021-10-02 DIAGNOSIS — Z3689 Encounter for other specified antenatal screening: Secondary | ICD-10-CM

## 2021-10-02 DIAGNOSIS — O24410 Gestational diabetes mellitus in pregnancy, diet controlled: Secondary | ICD-10-CM | POA: Insufficient documentation

## 2021-10-02 DIAGNOSIS — O10013 Pre-existing essential hypertension complicating pregnancy, third trimester: Secondary | ICD-10-CM

## 2021-10-02 DIAGNOSIS — D259 Leiomyoma of uterus, unspecified: Secondary | ICD-10-CM

## 2021-10-02 DIAGNOSIS — Z86718 Personal history of other venous thrombosis and embolism: Secondary | ICD-10-CM

## 2021-10-02 DIAGNOSIS — O43199 Other malformation of placenta, unspecified trimester: Secondary | ICD-10-CM

## 2021-10-02 DIAGNOSIS — O10913 Unspecified pre-existing hypertension complicating pregnancy, third trimester: Secondary | ICD-10-CM | POA: Insufficient documentation

## 2021-10-02 DIAGNOSIS — O09899 Supervision of other high risk pregnancies, unspecified trimester: Secondary | ICD-10-CM

## 2021-10-02 DIAGNOSIS — O34219 Maternal care for unspecified type scar from previous cesarean delivery: Secondary | ICD-10-CM

## 2021-10-07 ENCOUNTER — Other Ambulatory Visit (HOSPITAL_COMMUNITY): Payer: Self-pay

## 2021-10-12 NOTE — Progress Notes (Signed)
OBSTETRICS PRENATAL VIRTUAL VISIT ENCOUNTER NOTE  Provider location: [facility] [place] [facility]   Patient location: Home  I connected with [name] on 10/14/21 at 10:15 AM EST by MyChart Video Encounter and verified that I am speaking with the correct person using two identifiers. I discussed the limitations, risks, security and privacy concerns of performing an evaluation and management service virtually and the availability of in person appointments. I also discussed with the patient that there may be a patient responsible charge related to this service. The patient expressed understanding and agreed to proceed. Subjective:  Ariellah Faust is a 36 y.o. G2P0101 at 58w0dbeing seen today for ongoing prenatal care.  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  Patient reports no complaints.  Contractions: Irritability. Vag. Bleeding: None.  Movement: Present. Denies any leaking of fluid.   The following portions of the patient's history were reviewed and updated as appropriate: allergies, current medications, past family history, past medical history, past social history, past surgical history and problem list.   Objective:   Vitals:   10/14/21 1019  BP: 126/88  Pulse: 96  Weight: 169 lb 8 oz  (76.9 kg)    Fetal Status:     Movement: Present     General:  Alert, oriented and cooperative. Patient is in no acute distress.  Respiratory: Normal respiratory effort, no problems with respiration noted  Mental Status: Normal mood and affect. Normal behavior. Normal judgment and thought content.  Rest of physical exam deferred due to type of encounter  Imaging: UKoreaMFM OB FOLLOW UP  Result Date: 10/02/2021 ----------------------------------------------------------------------  OBSTETRICS REPORT                    (Corrected Final 10/02/2021 09:22 am) ---------------------------------------------------------------------- Patient Info  ID #:       [id number]                         D.O.B.:  003-14-1987(36 yrs)  Name:       [name]                 Visit Date: 10/02/2021 08:42 am              [place] ---------------------------------------------------------------------- Performed By  Attending:        CSander Nephew     Ref. Address:     [address]                   [place]                                                             [place]  [address]  Performed By:     Jacob Moores BS,       [facility]                                [facility]                                                             [place] [facility]                                                             [facility]  Referred By:      [facility] ---------------------------------------------------------------------- Orders  #  Description                           Code        Ordered By  1  [place] MFM OB FOLLOW UP                   [phone]    [place] ----------------------------------------------------------------------  #  Order #                     Accession #                Episode #  1  [id number]                   [id number]                 [id number]  ---------------------------------------------------------------------- Indications  [redacted] weeks gestation of pregnancy                Z3A.86  Advanced maternal age multigravida 51+,        O78.523  third trimester  Medical complication of pregnancy (PE,         O26.90  DVT) on Lovenox  Hypertension - Chronic/Pre-existing (no        O10.019  meds)  Gestational diabetes in pregnancy, diet        O24.410  controlled  Previous cesarean delivery, antepartum         O34.219  Pregnancy resulting from assisted              O3.819  reproductive technology (IVF)  Poor obstetric history: Previous preterm       O09.219  delivery, antepartum (29 weeks)  Low risk NIPS  Poor obstetrical history (previa w/ recurrent  O09.299  bleeding)  Uterine fibroids affecting pregnancy in third  O34.13, D25.9  trimester, antepartum  Marginal insertion of umbilical cord affecting O43.193  management of mother in third trimester  Encounter for other antenatal screening  Z36.2  follow-up ---------------------------------------------------------------------- Fetal Evaluation  Num Of Fetuses:         1  Fetal Heart Rate(bpm):  136  Cardiac Activity:       Observed  Presentation:           Cephalic  Placenta:               Posterior  P. Cord Insertion:      Marginal insertion, previously  Amniotic Fluid  AFI FV:      Within normal limits  AFI Sum(cm)     %Tile       Largest Pocket(cm)  11.6            29          3.7  RUQ(cm)       RLQ(cm)       LUQ(cm)        LLQ(cm)  3.7           3.4           2.1            2.4 ---------------------------------------------------------------------- Biometry  BPD:      74.8  mm     G. Age:  30w 0d        1.9  %    CI:        69.79   %    70 - 86                                                          FL/HC:      22.5   %    19.1 - 21.3  HC:      285.7  mm     G. Age:  31w 3d        4.1  %    HC/AC:      1.06        0.96 - 1.17  AC:      268.4  mm     G. Age:  31w 0d         14  %    FL/BPD:     86.0   %    71  - 87  FL:       64.3  mm     G. Age:  33w 1d         63  %    FL/AC:      24.0   %    20 - 24  LV:        2.1  mm  Est. FW:    1801  gm           4 lb     20  % ---------------------------------------------------------------------- Gestational Age  LMP:           49w 2d        Date:  10/22/20                 EDD:   07/29/21  U/S Today:     31w 3d                                        EDD:  12/01/21  Best:          32w 2d     Det. ByLoman Chroman         EDD:   11/25/21                                      (04/15/21) ---------------------------------------------------------------------- Anatomy  Cranium:               Appears normal         Aortic Arch:            Previously seen  Cavum:                 Appears normal         Ductal Arch:            Previously seen  Ventricles:            Appears normal         Diaphragm:              Appears normal  Choroid Plexus:        Previously seen        Stomach:                Appears normal, left                                                                        sided  Cerebellum:            Previously seen        Abdomen:                Previously seen  Posterior Fossa:       Previously seen        Abdominal Wall:         Previously seen  Nuchal Fold:           Not applicable (>26    Cord Vessels:           Previously seen                         wks GA)  Face:                  Orbits and profile     Kidneys:                Appear normal                         previously seen  Lips:                  Previously seen        Bladder:                Appears normal  Thoracic:              Previously seen        Spine:                  Previously seen  Heart:  Appears normal         Upper Extremities:      Previously seen                         (4CH, axis, and                         situs)  RVOT:                  Previously seen        Lower Extremities:      Previously seen  LVOT:                  Previously seen  Other:  Fetus appears to be  female. 3VTV previously visualized. Heels/feet,          open right hand/5th digit previously visualized. Technically difficult          due to advanced GA and fetal position. ---------------------------------------------------------------------- Cervix Uterus Adnexa  Cervix  Not visualized (advanced GA >24wks)  Uterus  Single fibroid noted, see table below.  Right Ovary  Not visualized.  Left Ovary  Not visualized.  Cul De Sac  No free fluid seen.  Adnexa  No abnormality visualized. ---------------------------------------------------------------------- Myomas  Site                     L(cm)      W(cm)      D(cm)       Location  Posterior Fundal         3.4        3.7        4.9         Intramural  Posterior                2.9        1.6        2.7         Intramural ----------------------------------------------------------------------  Blood Flow                  RI       PI       Comments                                                Previously seen, not                                                visualized today. ---------------------------------------------------------------------- Impression  Follow up growth due to AMA, CHTN (not on meds), with  known marginal cord insertion and A1GDM  Normal interval growth with measurements consistent with  dates  Good fetal movement and amniotic fluid volume  Ms. Krider reports that she is overall doing well, but  reports 2hr dinner postprandials that are a little elevated  After speaking with her it appears that it is circumstantial. We  discussed lifestyle modifcations that could adjust her blood  sugars. ---------------------------------------------------------------------- Recommendations  Repeat growth in 4 weeks. ----------------------------------------------------------------------                    Sander Nephew, MD Electronically Signed Corrected Final  Report  10/02/2021 09:22 am ----------------------------------------------------------------------    Assessment and Plan:  Pregnancy: G2P0101 at 43w0d1. History of pulmonary embolus (PE) - Continue lovenox - discussed option for heparin versus continuing lovenox given scheduled c-section is planned. She will consider heparin. Dose in third trimester is 10k BID.   2. GDM (gestational diabetes mellitus), class A1 - CBGs reviewed - fastings are around 90s, PP are 120-130s - Growth has been normal at 20%  3. HTN in pregnancy, chronic - controlled without medication - Following with Dr. THarriet Masson 4. Multigravida of advanced maternal age in third trimester  5. History of low transverse cesarean section at 29 weeks - Desires repeat c-section  6. 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 Pregnancy resulting from in vitro fertilization, second trimester - s/p normal fetal echo  8. Maternal iron deficiency anemia complicating pregnancy in third trimester - PO iron stopped - she has done IV iron. Last time was 3/22. Check cbc next time.   9. Supervision of high risk pregnancy in third trimester - GBS next time - TDAP next time - Reviewed timing of delivery - okay for 3107w5der mfm. Msg sent to pt after appt and scheduled for 4/10 per her request. May need sooner if new medical indication.   Preterm labor symptoms and general obstetric precautions including but not limited to vaginal bleeding, contractions, leaking of fluid and fetal movement were reviewed in detail with the patient. I discussed the assessment and treatment plan with the patient. The patient was provided an opportunity to ask questions and all were answered. The patient agreed with the plan and demonstrated an understanding of the instructions. The patient was advised to call back or seek an in-person office evaluation/[facility] any urgent or concerning symptoms. Please refer to After Visit Summary  for other counseling recommendations.   I provided 22 minutes of face-to-face time during this encounter.  Return in about 2 weeks (around 10/28/2021) for OB VISIT, MD only - next appt in person please.  Future Appointments  Date Time Provider [place]3/23/2023 10:55 AM AnHarolyn RutherfordgSallyanne HaversMD [facility]/24/2023  7:15 AM [facility] NURSE [facility]/2023  7:30 AM [facility] US3 [facility]FCUS [facility]4/21/2023 11:00 AM Tobb, KaGodfrey PickDO [facility]  PaRadene GunningMDMontgomery Cityor WoDean Foods CompanyCoIndian Harbour Beach

## 2021-10-14 ENCOUNTER — Encounter: Payer: Self-pay | Admitting: Obstetrics and Gynecology

## 2021-10-14 ENCOUNTER — Telehealth (INDEPENDENT_AMBULATORY_CARE_PROVIDER_SITE_OTHER): Payer: 59 | Admitting: Obstetrics and Gynecology

## 2021-10-14 VITALS — BP 126/88 | HR 96 | Wt 169.5 lb

## 2021-10-14 DIAGNOSIS — O0993 Supervision of high risk pregnancy, unspecified, third trimester: Secondary | ICD-10-CM

## 2021-10-14 DIAGNOSIS — Z98891 History of uterine scar from previous surgery: Secondary | ICD-10-CM

## 2021-10-14 DIAGNOSIS — O43193 Other malformation of placenta, third trimester: Secondary | ICD-10-CM

## 2021-10-14 DIAGNOSIS — O10919 Unspecified pre-existing hypertension complicating pregnancy, unspecified trimester: Secondary | ICD-10-CM

## 2021-10-14 DIAGNOSIS — O34219 Maternal care for unspecified type scar from previous cesarean delivery: Secondary | ICD-10-CM

## 2021-10-14 DIAGNOSIS — O24410 Gestational diabetes mellitus in pregnancy, diet controlled: Secondary | ICD-10-CM

## 2021-10-14 DIAGNOSIS — O09813 Supervision of pregnancy resulting from assisted reproductive technology, third trimester: Secondary | ICD-10-CM

## 2021-10-14 DIAGNOSIS — O09812 Supervision of pregnancy resulting from assisted reproductive technology, second trimester: Secondary | ICD-10-CM

## 2021-10-14 DIAGNOSIS — Z86711 Personal history of pulmonary embolism: Secondary | ICD-10-CM

## 2021-10-14 DIAGNOSIS — D509 Iron deficiency anemia, unspecified: Secondary | ICD-10-CM

## 2021-10-14 DIAGNOSIS — O99013 Anemia complicating pregnancy, third trimester: Secondary | ICD-10-CM

## 2021-10-14 DIAGNOSIS — O10913 Unspecified pre-existing hypertension complicating pregnancy, third trimester: Secondary | ICD-10-CM

## 2021-10-14 DIAGNOSIS — O09523 Supervision of elderly multigravida, third trimester: Secondary | ICD-10-CM

## 2021-10-14 DIAGNOSIS — O43199 Other malformation of placenta, unspecified trimester: Secondary | ICD-10-CM

## 2021-10-27 ENCOUNTER — Other Ambulatory Visit (HOSPITAL_COMMUNITY): Payer: Self-pay

## 2021-10-27 ENCOUNTER — Other Ambulatory Visit: Payer: Self-pay | Admitting: Family Medicine

## 2021-10-27 DIAGNOSIS — K219 Gastro-esophageal reflux disease without esophagitis: Secondary | ICD-10-CM

## 2021-10-27 MED ORDER — FAMOTIDINE 20 MG PO TABS
ORAL_TABLET | Freq: Two times a day (BID) | ORAL | 1 refills | Status: DC
  Filled 2021-10-27: qty 180, 90d supply, fill #0
  Filled 2022-01-12: qty 180, 90d supply, fill #1

## 2021-10-29 ENCOUNTER — Other Ambulatory Visit (HOSPITAL_COMMUNITY): Payer: Self-pay

## 2021-10-29 ENCOUNTER — Other Ambulatory Visit: Payer: Self-pay

## 2021-10-29 ENCOUNTER — Ambulatory Visit (INDEPENDENT_AMBULATORY_CARE_PROVIDER_SITE_OTHER): Payer: 59 | Admitting: Obstetrics & Gynecology

## 2021-10-29 ENCOUNTER — Other Ambulatory Visit (HOSPITAL_COMMUNITY)
Admission: RE | Admit: 2021-10-29 | Discharge: 2021-10-29 | Disposition: A | Discharge status: Home / Self Care | Payer: 59 | Source: Ambulatory Visit | Attending: Obstetrics & Gynecology | Admitting: Obstetrics & Gynecology

## 2021-10-29 ENCOUNTER — Encounter: Payer: Self-pay | Admitting: Obstetrics & Gynecology

## 2021-10-29 VITALS — BP 146/86 | HR 95 | Wt 175.2 lb

## 2021-10-29 DIAGNOSIS — Z86711 Personal history of pulmonary embolism: Secondary | ICD-10-CM

## 2021-10-29 DIAGNOSIS — O10919 Unspecified pre-existing hypertension complicating pregnancy, unspecified trimester: Secondary | ICD-10-CM

## 2021-10-29 DIAGNOSIS — Z23 Encounter for immunization: Secondary | ICD-10-CM | POA: Diagnosis not present

## 2021-10-29 DIAGNOSIS — Z3A36 36 weeks gestation of pregnancy: Secondary | ICD-10-CM

## 2021-10-29 DIAGNOSIS — Z86718 Personal history of other venous thrombosis and embolism: Secondary | ICD-10-CM

## 2021-10-29 DIAGNOSIS — O0993 Supervision of high risk pregnancy, unspecified, third trimester: Secondary | ICD-10-CM | POA: Diagnosis not present

## 2021-10-29 DIAGNOSIS — Z98891 History of uterine scar from previous surgery: Secondary | ICD-10-CM

## 2021-10-29 DIAGNOSIS — O24410 Gestational diabetes mellitus in pregnancy, diet controlled: Secondary | ICD-10-CM

## 2021-10-29 DIAGNOSIS — O119 Pre-existing hypertension with pre-eclampsia, unspecified trimester: Secondary | ICD-10-CM

## 2021-10-29 NOTE — Progress Notes (Signed)
? ?PRENATAL VISIT NOTE ? ?Subjective:  ?Melissa Ruiz is a 36 y.o. G2P0101 at 62w1dbeing seen today for ongoing prenatal care.  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 ? ?Patient reports elevated BP today at home 130s-140s/90s, with some nausea.  Patient denies any headaches, visual symptoms, RUQ/epigastric pain or other concerning symptoms.  Contractions: Irritability. Vag. Bleeding: None.  Movement: Present. Denies leaking of fluid.  ? ?The following portions of the patient's history were reviewed and updated as appropriate: allergies, current medications, past family history, past medical history, past social history, past surgical history and problem list.  ? ?Objective:  ? ?Vitals:  ? 10/29/21 1103  ?BP: (!) 146/86  ?Pulse: 95  ?Weight: 175 lb 3.2 oz (79.5 kg)  ? ? ?Fetal Status: Fetal Heart Rate (bpm): 130   Movement: Present    ? ?General:  Alert, oriented and cooperative. Patient is in no acute distress.  ?Skin: Skin is warm and dry. No rash noted.   ?Cardiovascular: Normal heart rate noted  ?Respiratory: Normal respiratory effort, no problems with respiration noted  ?Abdomen: Soft, gravid, appropriate for gestational age.  Pain/Pressure: Present     ?Pelvic: Pelvic cultures done. Exam performed in the presence of a chaperone         ?Extremities: Normal range of motion.  Edema: None  ?Mental Status: Normal mood and affect. Normal behavior. Normal judgment and thought content.  ? ?UKoreaMFM OB FOLLOW UP ? ?Result Date: 10/02/2021 ?----------------------------------------------------------------------  OBSTETRICS REPORT                    (Corrected Final 10/02/2021 09:22 am) ---------------------------------------------------------------------- Patient Info  ID #:       [id number]                         D.O.B.:  0Feb 23, 1987(35 yrs)  Name:       NAlverda Ruiz                 Visit Date: 10/02/2021 08:42 am              [place] ---------------------------------------------------------------------- Performed By  Attending:        CSander Nephew     Ref. Address:     [address]                   [place]                                                             [place]  [address]  Performed By:     Jacob Moores BS,       [facility]         [facility]                    RD[facility] RVT                                F[facility] Care at                                                             [place] for                                                             Women  Referred By:      [facility]                    for Women ---------------------------------------------------------------------- Orders  #  Description                           Code        Ordered By  1  [place] MFM OB FOLLOW UP                   [phone]    [place] ----------------------------------------------------------------------  #  Order #                     Accession #                Episode #  1  [id number]                   [id number]                 [id number] ---------------------------------------------------------------------- Indications  [redacted] weeks gestation of pregnancy                Z3A.22  Advanced maternal age multigravida 23+,        O38.523  third trimester  Medical complication of pregnancy  (PE,         O26.90  DVT) on Lovenox  Hypertension - Chronic/Pre-existing (no        O10.019  meds)  Gestational diabetes in pregnancy, diet        O24.410  controlled  Previous cesarean delivery, antepartum         O34.219  Pregnancy resulting from assisted              O88.819  reproductive technology (IVF)  Poor obstetric history: Previous preterm       O09.219  delivery, antepartum (29 weeks)  Low risk NIPS  Poor obstetrical history (previa w/ recurrent  O09.299  bleeding)  Uterine fibroids affecting pregnancy in third  O34.13, D25.9  trimester, antepartum  Marginal insertion of umbilical cord affecting O43.193  management of mother in third trimester  Encounter for other antenatal screening  Z36.2  follow-up ---------------------------------------------------------------------- Fetal Evaluation  Num Of Fetuses:         1  Fetal Heart Rate(bpm):  136  Cardiac Activity:       Observed  Presentation:           Cephalic  Placenta:               Posterior  P. Cord Insertion:      Marginal insertion, previously  Amniotic Fluid  AFI FV:      Within normal limits  AFI Sum(cm)     %Tile       Largest Pocket(cm)  11.6            29          3.7  RUQ(cm)       RLQ(cm)       LUQ(cm)        LLQ(cm)  3.7           3.4           2.1            2.4 ---------------------------------------------------------------------- Biometry  BPD:      74.8  mm     G. Age:  30w 0d        1.9  %    CI:        69.79   %    70 - 86                                                          FL/HC:      22.5   %    19.1 - 21.3  HC:      285.7  mm     G. Age:  31w 3d        4.1  %    HC/AC:      1.06        0.96 - 1.17  AC:      268.4  mm     G. Age:  31w 0d         14  %    FL/BPD:     86.0   %    71 - 87  FL:       64.3  mm     G. Age:  33w 1d         63  %    FL/AC:      24.0   %    20 - 24  LV:        2.1  mm  Est. FW:    1801  gm           4 lb     20  % ---------------------------------------------------------------------- Gestational  Age  LMP:           49w 2d        Date:  10/22/20                 EDD:   07/29/21  U/S Today:     31w 3d                                        EDD:  12/01/21  Best:          32w 2d     Det. ByLoman Chroman         EDD:   11/25/21                                      (04/15/21) ---------------------------------------------------------------------- Anatomy  Cranium:               Appears normal         Aortic Arch:            Previously seen  Cavum:                 Appears normal         Ductal Arch:            Previously seen  Ventricles:            Appears normal         Diaphragm:              Appears normal  Choroid Plexus:        Previously seen        Stomach:                Appears normal, left                                                                        sided  Cerebellum:            Previously seen        Abdomen:                Previously seen  Posterior Fossa:       Previously seen        Abdominal Wall:         Previously seen  Nuchal Fold:           Not applicable (>94    Cord Vessels:           Previously seen                         wks GA)  Face:                  Orbits and profile     Kidneys:                Appear normal                         previously seen  Lips:                  Previously seen        Bladder:                Appears normal  Thoracic:              Previously seen        Spine:                  Previously seen  Heart:  Appears normal         Upper Extremities:      Previously seen                         (4CH, axis, and                         situs)  RVOT:                  Previously seen        Lower Extremities:      Previously seen  LVOT:                  Previously seen  Other:  Fetus appears to be female. 3VTV previously visualized. Heels/feet,          open right hand/5th digit previously visualized. Technically difficult          due to advanced GA and fetal position. ----------------------------------------------------------------------  Cervix Uterus Adnexa  Cervix  Not visualized (advanced GA >24wks)  Uterus  Single fibroid noted, see table below.  Right Ovary  Not visualized.  Left Ovary  Not visualized.  Cul De Sac  No free fluid seen.  Adnexa  No a

## 2021-10-30 ENCOUNTER — Ambulatory Visit: Payer: 59 | Admitting: *Deleted

## 2021-10-30 ENCOUNTER — Other Ambulatory Visit: Payer: Self-pay | Admitting: *Deleted

## 2021-10-30 ENCOUNTER — Ambulatory Visit: Payer: 59 | Attending: Maternal & Fetal Medicine

## 2021-10-30 ENCOUNTER — Other Ambulatory Visit: Payer: Self-pay | Admitting: Obstetrics & Gynecology

## 2021-10-30 VITALS — BP 148/92 | HR 104

## 2021-10-30 DIAGNOSIS — O10013 Pre-existing essential hypertension complicating pregnancy, third trimester: Secondary | ICD-10-CM

## 2021-10-30 DIAGNOSIS — Z86718 Personal history of other venous thrombosis and embolism: Secondary | ICD-10-CM | POA: Diagnosis not present

## 2021-10-30 DIAGNOSIS — O10919 Unspecified pre-existing hypertension complicating pregnancy, unspecified trimester: Secondary | ICD-10-CM

## 2021-10-30 DIAGNOSIS — O43193 Other malformation of placenta, third trimester: Secondary | ICD-10-CM | POA: Diagnosis not present

## 2021-10-30 DIAGNOSIS — O3413 Maternal care for benign tumor of corpus uteri, third trimester: Secondary | ICD-10-CM

## 2021-10-30 DIAGNOSIS — O43199 Other malformation of placenta, unspecified trimester: Secondary | ICD-10-CM | POA: Diagnosis present

## 2021-10-30 DIAGNOSIS — D259 Leiomyoma of uterus, unspecified: Secondary | ICD-10-CM | POA: Insufficient documentation

## 2021-10-30 DIAGNOSIS — O133 Gestational [pregnancy-induced] hypertension without significant proteinuria, third trimester: Secondary | ICD-10-CM

## 2021-10-30 DIAGNOSIS — Z3689 Encounter for other specified antenatal screening: Secondary | ICD-10-CM | POA: Diagnosis present

## 2021-10-30 DIAGNOSIS — O34219 Maternal care for unspecified type scar from previous cesarean delivery: Secondary | ICD-10-CM | POA: Diagnosis not present

## 2021-10-30 DIAGNOSIS — Z86711 Personal history of pulmonary embolism: Secondary | ICD-10-CM

## 2021-10-30 DIAGNOSIS — O09899 Supervision of other high risk pregnancies, unspecified trimester: Secondary | ICD-10-CM

## 2021-10-30 DIAGNOSIS — O09213 Supervision of pregnancy with history of pre-term labor, third trimester: Secondary | ICD-10-CM | POA: Diagnosis not present

## 2021-10-30 DIAGNOSIS — Z3A36 36 weeks gestation of pregnancy: Secondary | ICD-10-CM

## 2021-10-30 DIAGNOSIS — Z98891 History of uterine scar from previous surgery: Secondary | ICD-10-CM

## 2021-10-30 DIAGNOSIS — O09523 Supervision of elderly multigravida, third trimester: Secondary | ICD-10-CM

## 2021-10-30 DIAGNOSIS — O10913 Unspecified pre-existing hypertension complicating pregnancy, third trimester: Secondary | ICD-10-CM

## 2021-10-30 DIAGNOSIS — O09813 Supervision of pregnancy resulting from assisted reproductive technology, third trimester: Secondary | ICD-10-CM | POA: Diagnosis not present

## 2021-10-30 DIAGNOSIS — O24410 Gestational diabetes mellitus in pregnancy, diet controlled: Secondary | ICD-10-CM

## 2021-10-30 DIAGNOSIS — O09293 Supervision of pregnancy with other poor reproductive or obstetric history, third trimester: Secondary | ICD-10-CM

## 2021-10-30 LAB — CERVICOVAGINAL ANCILLARY ONLY
Chlamydia: NEGATIVE
Comment: NEGATIVE
Comment: NORMAL
Neisseria Gonorrhea: NEGATIVE

## 2021-10-30 NOTE — Procedures (Signed)
Melissa Ruiz ?02-03-1986 ?[redacted]w[redacted]d? ?Fetus A Non-Stress Test Interpretation for 10/30/21 ? ?Indication: Unsatisfactory BPP ? ?Fetal Heart Rate A ?Mode: External ?Baseline Rate (A): 130 bpm ?Variability: Moderate ?Accelerations: 15 x 15 ?Decelerations: None ?Multiple birth?: No ? ?Uterine Activity ?Mode: Toco ?Contraction Frequency (min): none ?Resting Tone Palpated: Relaxed ? ?Interpretation (Fetal Testing) ?Nonstress Test Interpretation: Reactive ?Overall Impression: Reassuring for gestational age ?Comments: tracing reviewed by Dr. SDonalee Citrin? ? ?

## 2021-11-02 ENCOUNTER — Ambulatory Visit: Payer: 59 | Admitting: *Deleted

## 2021-11-02 ENCOUNTER — Encounter (HOSPITAL_COMMUNITY): Payer: Self-pay

## 2021-11-02 ENCOUNTER — Encounter: Payer: Self-pay | Admitting: *Deleted

## 2021-11-02 ENCOUNTER — Ambulatory Visit (HOSPITAL_BASED_OUTPATIENT_CLINIC_OR_DEPARTMENT_OTHER): Payer: 59

## 2021-11-02 ENCOUNTER — Other Ambulatory Visit: Payer: Self-pay

## 2021-11-02 ENCOUNTER — Encounter (HOSPITAL_COMMUNITY): Payer: 59

## 2021-11-02 ENCOUNTER — Ambulatory Visit: Payer: 59 | Attending: Obstetrics and Gynecology | Admitting: *Deleted

## 2021-11-02 VITALS — BP 151/87 | HR 91

## 2021-11-02 VITALS — BP 148/89

## 2021-11-02 DIAGNOSIS — O288 Other abnormal findings on antenatal screening of mother: Secondary | ICD-10-CM

## 2021-11-02 DIAGNOSIS — Z7901 Long term (current) use of anticoagulants: Secondary | ICD-10-CM | POA: Insufficient documentation

## 2021-11-02 DIAGNOSIS — O43199 Other malformation of placenta, unspecified trimester: Secondary | ICD-10-CM

## 2021-11-02 DIAGNOSIS — O09813 Supervision of pregnancy resulting from assisted reproductive technology, third trimester: Secondary | ICD-10-CM

## 2021-11-02 DIAGNOSIS — O09293 Supervision of pregnancy with other poor reproductive or obstetric history, third trimester: Secondary | ICD-10-CM

## 2021-11-02 DIAGNOSIS — O3413 Maternal care for benign tumor of corpus uteri, third trimester: Secondary | ICD-10-CM

## 2021-11-02 DIAGNOSIS — O34219 Maternal care for unspecified type scar from previous cesarean delivery: Secondary | ICD-10-CM | POA: Diagnosis not present

## 2021-11-02 DIAGNOSIS — O24410 Gestational diabetes mellitus in pregnancy, diet controlled: Secondary | ICD-10-CM

## 2021-11-02 DIAGNOSIS — O119 Pre-existing hypertension with pre-eclampsia, unspecified trimester: Secondary | ICD-10-CM

## 2021-11-02 DIAGNOSIS — Z98891 History of uterine scar from previous surgery: Secondary | ICD-10-CM

## 2021-11-02 DIAGNOSIS — O24419 Gestational diabetes mellitus in pregnancy, unspecified control: Secondary | ICD-10-CM | POA: Diagnosis not present

## 2021-11-02 DIAGNOSIS — O09213 Supervision of pregnancy with history of pre-term labor, third trimester: Secondary | ICD-10-CM | POA: Diagnosis not present

## 2021-11-02 DIAGNOSIS — D259 Leiomyoma of uterus, unspecified: Secondary | ICD-10-CM

## 2021-11-02 DIAGNOSIS — O4693 Antepartum hemorrhage, unspecified, third trimester: Secondary | ICD-10-CM | POA: Insufficient documentation

## 2021-11-02 DIAGNOSIS — Z3A36 36 weeks gestation of pregnancy: Secondary | ICD-10-CM

## 2021-11-02 DIAGNOSIS — O10013 Pre-existing essential hypertension complicating pregnancy, third trimester: Secondary | ICD-10-CM | POA: Diagnosis not present

## 2021-11-02 DIAGNOSIS — O43193 Other malformation of placenta, third trimester: Secondary | ICD-10-CM

## 2021-11-02 DIAGNOSIS — O1493 Unspecified pre-eclampsia, third trimester: Secondary | ICD-10-CM

## 2021-11-02 DIAGNOSIS — O10913 Unspecified pre-existing hypertension complicating pregnancy, third trimester: Secondary | ICD-10-CM

## 2021-11-02 DIAGNOSIS — O09523 Supervision of elderly multigravida, third trimester: Secondary | ICD-10-CM

## 2021-11-02 LAB — COMPREHENSIVE METABOLIC PANEL
ALT: 18 IU/L (ref 0–32)
AST: 22 IU/L (ref 0–40)
Albumin/Globulin Ratio: 1.4 (ref 1.2–2.2)
Albumin: 3.5 g/dL — ABNORMAL LOW (ref 3.8–4.8)
Alkaline Phosphatase: 182 IU/L — ABNORMAL HIGH (ref 44–121)
BUN/Creatinine Ratio: 9 (ref 9–23)
BUN: 6 mg/dL (ref 6–20)
Bilirubin Total: 0.4 mg/dL (ref 0.0–1.2)
CO2: 18 mmol/L — ABNORMAL LOW (ref 20–29)
Calcium: 9.4 mg/dL (ref 8.7–10.2)
Chloride: 105 mmol/L (ref 96–106)
Creatinine, Ser: 0.67 mg/dL (ref 0.57–1.00)
Globulin, Total: 2.5 g/dL (ref 1.5–4.5)
Glucose: 98 mg/dL (ref 70–99)
Potassium: 4.2 mmol/L (ref 3.5–5.2)
Sodium: 137 mmol/L (ref 134–144)
Total Protein: 6 g/dL (ref 6.0–8.5)
eGFR: 115 mL/min/{1.73_m2} (ref >59)

## 2021-11-02 LAB — PROTEIN / CREATININE RATIO, URINE
Creatinine, Urine: 81.8 mg/dL
Protein, Ur: 27.5 mg/dL
Protein/Creat Ratio: 336 mg/g creat — ABNORMAL HIGH (ref 0–200)

## 2021-11-02 LAB — CBC
Hematocrit: 36.3 % (ref 34.0–46.6)
Hemoglobin: 12.2 g/dL (ref 11.1–15.9)
MCH: 29.8 pg (ref 26.6–33.0)
MCHC: 33.6 g/dL (ref 31.5–35.7)
MCV: 89 fL (ref 79–97)
Platelets: 230 10*3/uL (ref 150–450)
RBC: 4.09 x10E6/uL (ref 3.77–5.28)
RDW: 16.9 % — ABNORMAL HIGH (ref 11.7–15.4)
WBC: 5.1 10*3/uL (ref 3.4–10.8)

## 2021-11-02 LAB — CULTURE, BETA STREP (GROUP B ONLY): Strep Gp B Culture: NEGATIVE

## 2021-11-02 NOTE — Progress Notes (Signed)
Lab Addendum ? ?Results for orders placed or performed in visit on 10/29/21 (from the past 168 hour(s))  ?Cervicovaginal ancillary only( [place])  ? Collection Time: 10/29/21 11:35 AM  ?Result Value Ref Range  ? Neisseria Gonorrhea Negative   ? Chlamydia Negative   ? Comment Normal Reference Ranger Chlamydia - Negative   ? Comment    ?  Normal Reference Range Neisseria Gonorrhea - Negative  ?Culture, beta strep (group b only)  ? Collection Time: 10/29/21 11:58 AM  ? Specimen: Vaginal/Rectal; Genital  ? VR  ?Result Value Ref Range  ? Strep Gp B Culture Negative Negative  ?CBC  ? Collection Time: 10/29/21 11:58 AM  ?Result Value Ref Range  ? WBC 5.1 3.4 - 10.8 x10E3/uL  ? RBC 4.09 3.77 - 5.28 x10E6/uL  ? Hemoglobin 12.2 11.1 - 15.9 g/dL  ? Hematocrit 36.3 34.0 - 46.6 %  ? MCV 89 79 - 97 fL  ? MCH 29.8 26.6 - 33.0 pg  ? MCHC 33.6 31.5 - 35.7 g/dL  ? RDW 16.9 (H) 11.7 - 15.4 %  ? Platelets 230 150 - 450 x10E3/uL  ?Comprehensive metabolic panel  ? Collection Time: 10/29/21 11:58 AM  ?Result Value Ref Range  ? Glucose 98 70 - 99 mg/dL  ? BUN 6 6 - 20 mg/dL  ? Creatinine, Ser 0.67 0.57 - 1.00 mg/dL  ? eGFR 115 >59 mL/min/1.73  ? BUN/Creatinine Ratio 9 9 - 23  ? Sodium 137 134 - 144 mmol/L  ? Potassium 4.2 3.5 - 5.2 mmol/L  ? Chloride 105 96 - 106 mmol/L  ? CO2 18 (L) 20 - 29 mmol/L  ? Calcium 9.4 8.7 - 10.2 mg/dL  ? Total Protein 6.0 6.0 - 8.5 g/dL  ? Albumin 3.5 (L) 3.8 - 4.8 g/dL  ? Globulin, Total 2.5 1.5 - 4.5 g/dL  ? Albumin/Globulin Ratio 1.4 1.2 - 2.2  ? Bilirubin Total 0.4 0.0 - 1.2 mg/dL  ? Alkaline Phosphatase 182 (H) 44 - 121 IU/L  ? AST 22 0 - 40 IU/L  ? ALT 18 0 - 32 IU/L  ?Protein / creatinine ratio, urine  ? Collection Time: 10/29/21 11:58 AM  ?Result Value Ref Range  ? Creatinine, Urine 81.8 Not Estab. mg/dL  ? Protein, Ur 27.5 Not Estab. mg/dL  ? Protein/Creat Ratio 336 (H) 0 - 200 mg/g creat  ? ?Given elevated BP and proteinuria, this meets [facility] with superimposed preeclampsia, no current  severe features.  Patient is already getting scheduled antenatal testing and is scheduled for repeat cesarean delivery on 11/06/21. She has been counseled and understands that earlier delivery could be indicated for severe features, or other concerning maternal-fetal conditions.  Fetal movement and labor precautions emphasized. ? ?Will follow up BP during her antenatal testing appointment with MFM on 11/02/21 and continue close monitoring. ? ? ? ?[name], MD, FACOG ?Obstetrician Social research officer, government, Faculty Practice ?[facility] [place] ? ? ?

## 2021-11-02 NOTE — Procedures (Signed)
Jason Fila Anastas ?Jul 29, 1986 ?[redacted]w[redacted]d? ?Fetus A Non-Stress Test Interpretation for 11/02/21 ? ?Indication: Chronic Hypertenstion and Superimposed preeclampsia ? ?Fetal Heart Rate A ?Mode: External ?Baseline Rate (A): 135 bpm ?Variability: Moderate ?Accelerations: 15 x 15 ?Decelerations: None ?Multiple birth?: No ? ?Uterine Activity ?Mode: Palpation, Toco ?Contraction Frequency (min): irreg ?Contraction Duration (sec): 60-80 ?Contraction Quality: Mild ?Resting Tone Palpated: Relaxed ?Resting Time: Adequate ? ?Interpretation (Fetal Testing) ?Nonstress Test Interpretation: Reactive ?Comments: Dr. SDonalee Citrinreviewed tracing. Patient to have BPP @ 1245 ? ? ?

## 2021-11-02 NOTE — Patient Instructions (Signed)
[name] ? 11/02/2021 ? ? Your procedure is scheduled on:  11/06/2021 ? Arrive at [facility] at [place] on [address] at [facility]  and [employer] [employer] [address]. You are invited to use the FREE valet parking or use the Visitor's parking deck. ? Pick up the phone at the desk and dial [phone]. ? Call this number if you have problems the morning of surgery: [phone] ? Remember: ? ? Do not eat food:(After Midnight) Desp?s de medianoche. ? Do not drink clear liquids: (After Midnight) Desp?s de medianoche. ? Take these medicines the morning of surgery with A SIP OF WATER:  none ? ? Do not wear jewelry, make-up or nail polish. ? Do not wear lotions, powders, or perfumes. Do not wear deodorant. ? Do not shave 48 hours prior to surgery. ? Do not bring valuables to the hospital.  [facility] is not  ? responsible for any belongings or valuables brought to the hospital. ? Contacts, dentures or bridgework may not be worn into surgery. ? Leave suitcase in the car. After surgery it may be brought to your room. ? For patients admitted to the hospital, checkout time is 11:00 AM the day of  ?            discharge. ? ?   ? Please read over the following fact sheets that you were given:  ?   Preparing for Surgery ? ? ?

## 2021-11-04 ENCOUNTER — Other Ambulatory Visit: Payer: Self-pay

## 2021-11-04 ENCOUNTER — Other Ambulatory Visit (HOSPITAL_COMMUNITY)
Admission: RE | Admit: 2021-11-04 | Discharge: 2021-11-04 | Disposition: A | Discharge status: Home / Self Care | Payer: 59 | Source: Ambulatory Visit | Attending: Obstetrics & Gynecology | Admitting: Obstetrics & Gynecology

## 2021-11-04 DIAGNOSIS — O114 Pre-existing hypertension with pre-eclampsia, complicating childbirth: Secondary | ICD-10-CM | POA: Diagnosis not present

## 2021-11-04 DIAGNOSIS — Z302 Encounter for sterilization: Secondary | ICD-10-CM | POA: Diagnosis not present

## 2021-11-04 DIAGNOSIS — Z7901 Long term (current) use of anticoagulants: Secondary | ICD-10-CM | POA: Diagnosis not present

## 2021-11-04 DIAGNOSIS — O9902 Anemia complicating childbirth: Secondary | ICD-10-CM | POA: Diagnosis not present

## 2021-11-04 DIAGNOSIS — O34219 Maternal care for unspecified type scar from previous cesarean delivery: Secondary | ICD-10-CM | POA: Diagnosis not present

## 2021-11-04 DIAGNOSIS — O24420 Gestational diabetes mellitus in childbirth, diet controlled: Secondary | ICD-10-CM | POA: Diagnosis not present

## 2021-11-04 DIAGNOSIS — D509 Iron deficiency anemia, unspecified: Secondary | ICD-10-CM | POA: Diagnosis not present

## 2021-11-04 DIAGNOSIS — O34211 Maternal care for low transverse scar from previous cesarean delivery: Secondary | ICD-10-CM | POA: Diagnosis not present

## 2021-11-04 DIAGNOSIS — Z98891 History of uterine scar from previous surgery: Secondary | ICD-10-CM

## 2021-11-04 DIAGNOSIS — Z86718 Personal history of other venous thrombosis and embolism: Secondary | ICD-10-CM | POA: Diagnosis not present

## 2021-11-04 DIAGNOSIS — Z3A37 37 weeks gestation of pregnancy: Secondary | ICD-10-CM | POA: Diagnosis not present

## 2021-11-04 DIAGNOSIS — O1002 Pre-existing essential hypertension complicating childbirth: Secondary | ICD-10-CM | POA: Diagnosis not present

## 2021-11-04 DIAGNOSIS — O43123 Velamentous insertion of umbilical cord, third trimester: Secondary | ICD-10-CM | POA: Diagnosis not present

## 2021-11-04 DIAGNOSIS — Z86711 Personal history of pulmonary embolism: Secondary | ICD-10-CM | POA: Diagnosis not present

## 2021-11-04 LAB — COMPREHENSIVE METABOLIC PANEL
ALT: 22 U/L (ref 0–44)
AST: 28 U/L (ref 15–41)
Albumin: 2.7 g/dL — ABNORMAL LOW (ref 3.5–5.0)
Alkaline Phosphatase: 165 U/L — ABNORMAL HIGH (ref 38–126)
Anion gap: 8 (ref 5–15)
BUN: 5 mg/dL — ABNORMAL LOW (ref 6–20)
CO2: 21 mmol/L — ABNORMAL LOW (ref 22–32)
Calcium: 9.3 mg/dL (ref 8.9–10.3)
Chloride: 105 mmol/L (ref 98–111)
Creatinine, Ser: 0.78 mg/dL (ref 0.44–1.00)
GFR, Estimated: >60 mL/min (ref >60)
Glucose, Bld: 137 mg/dL — ABNORMAL HIGH (ref 70–99)
Potassium: 3.8 mmol/L (ref 3.5–5.1)
Sodium: 134 mmol/L — ABNORMAL LOW (ref 135–145)
Total Bilirubin: 0.8 mg/dL (ref 0.3–1.2)
Total Protein: 5.9 g/dL — ABNORMAL LOW (ref 6.5–8.1)

## 2021-11-04 LAB — CBC
HCT: 37 % (ref 36.0–46.0)
Hemoglobin: 12.1 g/dL (ref 12.0–15.0)
MCH: 29.9 pg (ref 26.0–34.0)
MCHC: 32.7 g/dL (ref 30.0–36.0)
MCV: 91.4 fL (ref 80.0–100.0)
Platelets: 237 10*3/uL (ref 150–400)
RBC: 4.05 MIL/uL (ref 3.87–5.11)
RDW: 16.2 % — ABNORMAL HIGH (ref 11.5–15.5)
WBC: 5.2 10*3/uL (ref 4.0–10.5)
nRBC: 0 % (ref 0.0–0.2)

## 2021-11-04 LAB — TYPE AND SCREEN
ABO/RH(D): A POS
Antibody Screen: NEGATIVE

## 2021-11-04 NOTE — Progress Notes (Signed)
?  Subjective:  ?Melissa Ruiz is a 36 y.o. G2P0101 at 41w0dbeing seen today via MyChart for ongoing prenatal care.  She is currently monitored for the following issues for this high-risk pregnancy and has History of pulmonary embolus (PE); History of DVT (deep vein thrombosis); History of goiter; Supervision of high-risk pregnancy; Endometriosis; Multigravida of advanced maternal age; GDM (gestational diabetes mellitus), class A1; Chronic hypertension with superimposed preeclampsia; Mixed hyperlipidemia; History of low transverse cesarean section at 29 weeks; Marginal insertion of umbilical cord affecting management of mother; Pregnancy resulting from in vitro fertilization, second trimester; Anticoagulated; Pelvic adhesive disease; and Maternal iron deficiency anemia complicating pregnancy in third trimester on their problem list. ? ?Patient reports  some braxton hicks  .  Contractions: Not present. Vag. Bleeding: None.   . Denies leaking of fluid.  ? ?The following portions of the patient's history were reviewed and updated as appropriate: allergies, current medications, past family history, past medical history, past social history, past surgical history and problem list. Problem list updated. ? ?Objective:  ? ?Vitals:  ? 11/05/21 1038  ?BP: (!) 130/94  ?Pulse: 96  ? ? ? ?General:  Alert, oriented and cooperative. Patient is in no acute distress.  ?Respiratory: Deferred- Mychart visit  ?Abdomen: Deferred- Mychart visit Pain/Pressure: Absent     ?Pelvic: Vag. Bleeding: None     ?Cervical exam deferred        ?Mental Status: Normal mood and affect. Normal behavior. Normal judgment and thought content.  ? ? ?Assessment and Plan:  ?Pregnancy: G2P0101 at 312w0d ?1. Supervision of high risk pregnancy in third trimester ?Doing well. No acute concerns. Has RCS scheduled tomorrow ? ?2. History of pulmonary embolus (PE) ?On Lovenox. Scheduled for RCS tomorrow (3/31). Held this morning's dose in prep for  surgery. Plan to restart post op and continue for 6 weeks ? ?3. GDM (gestational diabetes mellitus), class A1 ?BG fine. CGM - fastings in 70s-80s. 2 hr PP 120s or less per patient. Plan for FBG on POD#1 and also 6 wk GTT ? ?4. Chronic hypertension with superimposed preeclampsia ?BP today at home 130/94. No symptoms. Cesarean delivery planned tomorrow ? ?5. History of low transverse cesarean section at 29 weeks ? ? ?6. Marginal insertion of umbilical cord affecting management of mother ? ?7. Pregnancy resulting from in vitro fertilization, second trimester ? ?8. Maternal iron deficiency anemia complicating pregnancy in third trimester ?S/p Iron infusion. Last  HgB 12.1 (yesterday done for pre-op labs) ? ?Preterm labor symptoms and general obstetric precautions including but not limited to vaginal bleeding, contractions, leaking of fluid and fetal movement were reviewed in detail with the patient. ?Please refer to After Visit Summary for other counseling recommendations.  ?Return in about 6 weeks (around 12/17/2021) for 6 wk PP visit . ? ? ?AnRenard MatterMD, MPH ?OB Fellow, Faculty Practice ? ? ?

## 2021-11-05 ENCOUNTER — Telehealth (INDEPENDENT_AMBULATORY_CARE_PROVIDER_SITE_OTHER): Payer: 59 | Admitting: Family Medicine

## 2021-11-05 VITALS — BP 130/94 | HR 96

## 2021-11-05 DIAGNOSIS — O09812 Supervision of pregnancy resulting from assisted reproductive technology, second trimester: Secondary | ICD-10-CM

## 2021-11-05 DIAGNOSIS — Z3A37 37 weeks gestation of pregnancy: Secondary | ICD-10-CM

## 2021-11-05 DIAGNOSIS — Z98891 History of uterine scar from previous surgery: Secondary | ICD-10-CM

## 2021-11-05 DIAGNOSIS — Z86711 Personal history of pulmonary embolism: Secondary | ICD-10-CM

## 2021-11-05 DIAGNOSIS — D509 Iron deficiency anemia, unspecified: Secondary | ICD-10-CM

## 2021-11-05 DIAGNOSIS — Z0289 Encounter for other administrative examinations: Secondary | ICD-10-CM

## 2021-11-05 DIAGNOSIS — O0993 Supervision of high risk pregnancy, unspecified, third trimester: Secondary | ICD-10-CM

## 2021-11-05 DIAGNOSIS — O43199 Other malformation of placenta, unspecified trimester: Secondary | ICD-10-CM

## 2021-11-05 DIAGNOSIS — O119 Pre-existing hypertension with pre-eclampsia, unspecified trimester: Secondary | ICD-10-CM

## 2021-11-05 DIAGNOSIS — O99013 Anemia complicating pregnancy, third trimester: Secondary | ICD-10-CM

## 2021-11-05 DIAGNOSIS — O43193 Other malformation of placenta, third trimester: Secondary | ICD-10-CM

## 2021-11-05 DIAGNOSIS — O24410 Gestational diabetes mellitus in pregnancy, diet controlled: Secondary | ICD-10-CM

## 2021-11-05 DIAGNOSIS — O34219 Maternal care for unspecified type scar from previous cesarean delivery: Secondary | ICD-10-CM

## 2021-11-05 LAB — RPR: RPR Ser Ql: NONREACTIVE

## 2021-11-05 NOTE — Progress Notes (Signed)
I connected with  Melissa Ruiz on 11/05/21 at 10:35 AM EDT by MyChart Video Visit and verified that I am speaking with the correct person using two identifiers. ?  ?I discussed the limitations, risks, security and privacy concerns of performing an evaluation and management service by telephone and the availability of in person appointments. I also discussed with the patient that there may be a patient responsible charge related to this service. The patient expressed understanding and agreed to proceed. ? ?Mena Goes, CMA ?11/05/2021  10:37 AM  ?

## 2021-11-05 NOTE — H&P (Signed)
? ?OBSTETRIC ADMISSION HISTORY AND PHYSICAL ? ?Melissa Ruiz is a 36 y.o. female G2P0101 with IUP at 52w2dby early [place] for repeat scheduled repeat cesarean section. She reports +FMs, No LOF, no VB, no blurry vision, headaches or peripheral edema, and RUQ pain.  She plans on breast feeding. She request salpoingectomy for birth control. ?[facility]  ? ?Dating: By early [place]--->  Estimated Date of Delivery: 11/25/21 ? ?Sono:   ?'@[redacted]w[redacted]d'$ , CWD, normal anatomy, cephalic presentation, posterior placental  lie, marginal cord insertion,  2716g, 33% EFW ? ? ?Prenatal History/Complications:  ?Low normal AFI -5%ile (3/24) ?cHTN with SIPE without SF ?GDM ?History of 1 prior CS (for placenta previa) ?History of PE (on Lovenox) ?IVF and embryo transfer ?Advanced maternal age - 89? ?Past Medical History: ?Past Medical History:  ?Diagnosis Date  ? Anemia   ? D[place]) [place]  ? Endometriosis 01/08/2021  ? Essential hypertension 09/16/2014  ? History of placenta previa 05/14/2021  ? Not seen on 2022 pregnancy  ? Hyperlipidemia   ? Pregnancy resulting from in vitro fertilization, antepartum 04/12/2018  ? Patient of Dr. YKerin Perna  EDD 11/10/2018 by IVF dating.  '[x]'$  fetal echo: normal except known PACs  ? Pulmonary embolism ([place] 09/2015  ? Vitamin D deficiency 08/12/2017  ? ? ?Past Surgical History: ?Past Surgical History:  ?Procedure Laterality Date  ? CESAREAN SECTION N/A 08/27/2018  ? Procedure: CESAREAN SECTION;  Surgeon: AWoodroe Mode MD;  [facility] [facility]  Service: Obstetrics;  Laterality: N/A;  ? CHOLECYSTECTOMY    ? DIAGNOSTIC LAPAROSCOPY  11/2020  ? LOA, right salpingectoy  ? ? ?Obstetrical History: ?OB History   ? ? Gravida  ?2  ? Para  ?1  ? Term  ?0  ? Preterm  ?1  ? AB  ?0  ? Living  ?1  ?  ? ? SAB  ?0  ? IAB  ?0  ? Ectopic  ?0  ? Multiple  ?0  ? Live Births  ?1  ?   ?  ?  ? ? ?Social History ?Social History  ? ?Socioeconomic History   ? Marital status: Married  ?  Spouse name: Not on file  ? Number of children: Not on file  ? Years of education: Not on file  ? Highest education level: Not on file  ?Occupational History  ? Not on file  ?Tobacco Use  ? Smoking status: Never  ? Smokeless tobacco: Never  ?Vaping Use  ? Vaping Use: Never used  ?Substance and Sexual Activity  ? Alcohol use: Not Currently  ?  Alcohol/week: 0.0 standard drinks  ?  Comment: rarely  ? Drug use: No  ? Sexual activity: Yes  ?  Birth control/protection: None  ?Other Topics Concern  ? Not on file  ?Social History Narrative  ? Not on file  ? ?Social Determinants of Health  ? ?Financial Resource Strain: Low Risk   ? Difficulty of Paying Living Expenses: Not hard at all  ?Food Insecurity: No Food Insecurity  ? Worried About RCharity fundraiserin the Last Year: Never true  ? Ran Out of Food in the Last Year: Never true  ?Transportation Needs: No Transportation Needs  ? Lack of Transportation (Medical): No  ? Lack of Transportation (Non-Medical): No  ?Physical Activity: Not on file  ?Stress: Not on file  ?Social Connections: Not on file  ? ? ?Family History: ?Family History  ?  Problem Relation Age of Onset  ? Hypertension Mother   ? Hypertension Father   ? Diabetes Father   ? Cancer Father   ? ? ?Allergies: ?No Known Allergies ? ?Medications Prior to Admission  ?Medication Sig Dispense Refill Last Dose  ? aspirin EC 81 MG tablet Take 1 tablet (81 mg total) by mouth daily. Take after 12 weeks for prevention of preeclampsia later in pregnancy 300 tablet 2 Past Week  ? enoxaparin (LOVENOX) 40 MG/0.4ML injection Inject 0.4 mLs (40 mg total) into the skin daily. 30 mL 6 11/04/2021  ? famotidine (PEPCID) 20 MG tablet TAKE 1 TABLET BY MOUTH 2 TIMES DAILY 180 tablet 1 11/05/2021  ? Prenatal Vit-Fe Fumarate-FA (MULTIVITAMIN-PRENATAL) 27-0.8 MG TABS tablet Take 1 tablet by mouth in the morning.   11/05/2021  ? Continuous Blood Gluc Sensor (FREESTYLE LIBRE 3 SENSOR) MISC Use as directed every  14 days. 2 each 11   ? ? ? ?Review of Systems  ? ?All systems reviewed and negative except as stated in HPI ? ?Physical Exam ?Blood pressure (!) 146/93, pulse 91, temperature 98.2 ?F (36.8 ?C), temperature source Oral, resp. rate 17, height '5\' 5"'$  (1.651 m), weight 79.4 kg, last menstrual period 10/22/2020, SpO2 100 %, currently breastfeeding. ?General appearance: alert, cooperative, and appears stated age ?Lungs: normal effort ?Heart: regular rate and rhythm ?Abdomen: soft, non-tender; bowel sounds normal ?Extremities: Homans sign is negative, no sign of DVT ?Presentation: cephalic ? ?  ? ? ?Prenatal labs: ?ABO, Rh: --/--/A POS (03/29 8921) ?Antibody: NEG (03/29 0955) ?Rubella: 11.00 (10/06 1529) ?RPR: NON REACTIVE (03/29 0955)  ?HBsAg: Negative (10/06 1529)  ?HIV: Non Reactive (02/08 1006)  ?GBS: Negative/-- (03/23 1158)  ?2 hr Glucola abnormal ?Genetic screening  LR NIPS ?Anatomy US normal growth and AFI ? ?Prenatal Transfer Tool  ?Maternal Diabetes: Yes:  Diabetes Type:  Diet controlled ?Genetic Screening: Normal ?Maternal Ultrasounds/Referrals: Normal ?Fetal Ultrasounds or other Referrals:  Fetal echo- normal (done for IVF conception) ?Maternal Substance Abuse:  No ?Significant Maternal Medications:  Meds include: Other: Lovenox, ASA, Pepcid ?Significant Maternal Lab Results: Group B Strep negative ? ?No results found for this or any previous visit (from the past 24 hour(s)). ? ?Patient Active Problem List  ? Diagnosis Date Noted  ? Maternal iron deficiency anemia complicating pregnancy in third trimester 09/18/2021  ? Pelvic adhesive disease 09/12/2021  ? Pregnancy resulting from in vitro fertilization, second trimester 07/27/2021  ? Anticoagulated 07/27/2021  ? Marginal insertion of umbilical cord affecting management of mother 07/10/2021  ? History of low transverse cesarean section at 29 weeks 06/26/2021  ? Chronic hypertension with superimposed preeclampsia 06/08/2021  ? Mixed hyperlipidemia 06/08/2021  ?  GDM (gestational diabetes mellitus), class A1 06/04/2021  ? Multigravida of advanced maternal age 41/01/2021  ? Endometriosis 01/08/2021  ? Supervision of high-risk pregnancy 04/12/2018  ? History of pulmonary embolus (PE) 08/12/2017  ? History of DVT (deep vein thrombosis) 08/12/2017  ? History of goiter 08/12/2017  ? ? ?Assessment/Plan:  ?Leone Mobley is a 36 y.o. G2P0101 at 64w2dhere for repeat scheduled repeat cesarean section ? ?#Scheduled repeat CS ?The risks of cesarean section were discussed with the patient including but were not limited to: bleeding which may require transfusion or reoperation; infection which may require antibiotics; injury to bowel, bladder, ureters or other surrounding organs; injury to the fetus; need for additional procedures including hysterectomy in the event of a life-threatening hemorrhage; placental abnormalities wth subsequent pregnancies, incisional problems, thromboembolic phenomenon and other  postoperative/anesthesia complications. Anesthesia and OR aware.  Preoperative prophylactic antibiotics and SCDs ordered on call to the OR.  To OR when ready. ? ?#Pain: Spinal ?#ID:  GBS neg, will get pre-op abx ?#MOF: breast ?#MOC: salpingectomy ?#Circ:  N/A ? ?#cHTN with SIPE without SF ?BP at admission 146/93. P:C 0.3 on 3/23. Plan for Lasix after delivery and if meets PP BP criteria and will start procardia ? ?#GDM ?Not on meds. Plan for POD#1 CBG and 6 wk PP GTT ? ?#Hx of PE and DVT ?On Lovenox ppx in pregnancy. Held yesterday for pre-op. Will restart post op and continue for 6 weeks PP ? ?Donnamae Jude, MD  ?11/06/2021, 10:51 AM ? ? ? ?

## 2021-11-05 NOTE — Progress Notes (Signed)
Patient informed that she has a scheduled Csection tomorrow and has no questions or concerns  ?

## 2021-11-06 ENCOUNTER — Encounter (HOSPITAL_COMMUNITY)
Admission: RE | Disposition: A | Discharge status: Home / Self Care | Payer: Self-pay | Source: Ambulatory Visit | Attending: Family Medicine

## 2021-11-06 ENCOUNTER — Encounter: Payer: Self-pay | Admitting: Radiology

## 2021-11-06 ENCOUNTER — Inpatient Hospital Stay (HOSPITAL_COMMUNITY): Payer: 59 | Admitting: Anesthesiology

## 2021-11-06 ENCOUNTER — Inpatient Hospital Stay (HOSPITAL_COMMUNITY)
Admission: RE | Admit: 2021-11-06 | Discharge: 2021-11-08 | DRG: 784 | Disposition: A | Discharge status: Home / Self Care | Payer: 59 | Source: Ambulatory Visit | Attending: Family Medicine | Admitting: Family Medicine

## 2021-11-06 ENCOUNTER — Other Ambulatory Visit: Payer: Self-pay

## 2021-11-06 ENCOUNTER — Encounter (HOSPITAL_COMMUNITY): Payer: Self-pay | Admitting: Family Medicine

## 2021-11-06 DIAGNOSIS — Z86718 Personal history of other venous thrombosis and embolism: Secondary | ICD-10-CM

## 2021-11-06 DIAGNOSIS — Z3A37 37 weeks gestation of pregnancy: Secondary | ICD-10-CM

## 2021-11-06 DIAGNOSIS — O43123 Velamentous insertion of umbilical cord, third trimester: Secondary | ICD-10-CM | POA: Diagnosis present

## 2021-11-06 DIAGNOSIS — Z302 Encounter for sterilization: Secondary | ICD-10-CM

## 2021-11-06 DIAGNOSIS — D509 Iron deficiency anemia, unspecified: Secondary | ICD-10-CM | POA: Diagnosis present

## 2021-11-06 DIAGNOSIS — O24420 Gestational diabetes mellitus in childbirth, diet controlled: Secondary | ICD-10-CM | POA: Diagnosis present

## 2021-11-06 DIAGNOSIS — O34211 Maternal care for low transverse scar from previous cesarean delivery: Principal | ICD-10-CM | POA: Diagnosis present

## 2021-11-06 DIAGNOSIS — Z98891 History of uterine scar from previous surgery: Secondary | ICD-10-CM

## 2021-11-06 DIAGNOSIS — Z86711 Personal history of pulmonary embolism: Secondary | ICD-10-CM | POA: Diagnosis not present

## 2021-11-06 DIAGNOSIS — Z7901 Long term (current) use of anticoagulants: Secondary | ICD-10-CM | POA: Diagnosis not present

## 2021-11-06 DIAGNOSIS — O09812 Supervision of pregnancy resulting from assisted reproductive technology, second trimester: Secondary | ICD-10-CM

## 2021-11-06 DIAGNOSIS — Z9079 Acquired absence of other genital organ(s): Secondary | ICD-10-CM

## 2021-11-06 DIAGNOSIS — N736 Female pelvic peritoneal adhesions (postinfective): Secondary | ICD-10-CM | POA: Diagnosis present

## 2021-11-06 DIAGNOSIS — O114 Pre-existing hypertension with pre-eclampsia, complicating childbirth: Secondary | ICD-10-CM | POA: Diagnosis not present

## 2021-11-06 DIAGNOSIS — O1002 Pre-existing essential hypertension complicating childbirth: Secondary | ICD-10-CM | POA: Diagnosis present

## 2021-11-06 DIAGNOSIS — O24410 Gestational diabetes mellitus in pregnancy, diet controlled: Secondary | ICD-10-CM | POA: Diagnosis present

## 2021-11-06 DIAGNOSIS — O9902 Anemia complicating childbirth: Secondary | ICD-10-CM | POA: Diagnosis present

## 2021-11-06 DIAGNOSIS — O0990 Supervision of high risk pregnancy, unspecified, unspecified trimester: Secondary | ICD-10-CM

## 2021-11-06 DIAGNOSIS — Z0289 Encounter for other administrative examinations: Secondary | ICD-10-CM

## 2021-11-06 DIAGNOSIS — O43199 Other malformation of placenta, unspecified trimester: Secondary | ICD-10-CM | POA: Diagnosis present

## 2021-11-06 SURGERY — Surgical Case
Anesthesia: Spinal

## 2021-11-06 MED ORDER — IBUPROFEN 600 MG PO TABS
600.0000 mg | ORAL_TABLET | Freq: Four times a day (QID) | ORAL | Status: DC
  Administered 2021-11-07 – 2021-11-08 (×5): 600 mg via ORAL
  Filled 2021-11-06 (×5): qty 1

## 2021-11-06 MED ORDER — BUPIVACAINE HCL (PF) 0.25 % IJ SOLN
INTRAMUSCULAR | Status: AC
  Filled 2021-11-06: qty 30

## 2021-11-06 MED ORDER — TETANUS-DIPHTH-ACELL PERTUSSIS 5-2.5-18.5 LF-MCG/0.5 IM SUSY: 0.5000 mL | PREFILLED_SYRINGE | Freq: Once | INTRAMUSCULAR | Status: DC

## 2021-11-06 MED ORDER — OXYCODONE HCL 5 MG PO TABS
5.0000 mg | ORAL_TABLET | Freq: Four times a day (QID) | ORAL | Status: DC | PRN
  Administered 2021-11-06: 10 mg via ORAL
  Administered 2021-11-07 – 2021-11-08 (×3): 5 mg via ORAL
  Filled 2021-11-06 (×2): qty 1
  Filled 2021-11-06: qty 2
  Filled 2021-11-06: qty 1

## 2021-11-06 MED ORDER — CELECOXIB 200 MG PO CAPS
ORAL_CAPSULE | ORAL | Status: AC
  Filled 2021-11-06: qty 1

## 2021-11-06 MED ORDER — MORPHINE SULFATE (PF) 0.5 MG/ML IJ SOLN
INTRAMUSCULAR | Status: DC | PRN
  Administered 2021-11-06: 150 ug via INTRATHECAL

## 2021-11-06 MED ORDER — OXYTOCIN-SODIUM CHLORIDE 30-0.9 UT/500ML-% IV SOLN
INTRAVENOUS | Status: DC | PRN
  Administered 2021-11-06: 30 [IU] via INTRAVENOUS

## 2021-11-06 MED ORDER — SOD CITRATE-CITRIC ACID 500-334 MG/5ML PO SOLN
ORAL | Status: AC
  Filled 2021-11-06: qty 30

## 2021-11-06 MED ORDER — PRENATAL MULTIVITAMIN CH
1.0000 | ORAL_TABLET | Freq: Every day | ORAL | Status: DC
  Administered 2021-11-07 – 2021-11-08 (×2): 1 via ORAL
  Filled 2021-11-06 (×2): qty 1

## 2021-11-06 MED ORDER — SIMETHICONE 80 MG PO CHEW: 80.0000 mg | CHEWABLE_TABLET | ORAL | Status: DC | PRN

## 2021-11-06 MED ORDER — OXYTOCIN-SODIUM CHLORIDE 30-0.9 UT/500ML-% IV SOLN
2.5000 [IU]/h | INTRAVENOUS | Status: AC
  Administered 2021-11-06: 2.5 [IU]/h via INTRAVENOUS
  Filled 2021-11-06: qty 500

## 2021-11-06 MED ORDER — SCOPOLAMINE 1 MG/3DAYS TD PT72
1.0000 | MEDICATED_PATCH | TRANSDERMAL | Status: DC
  Administered 2021-11-06: 1.5 mg via TRANSDERMAL

## 2021-11-06 MED ORDER — LACTATED RINGERS IV SOLN
INTRAVENOUS | Status: DC
  Administered 2021-11-06: 125 mL/h via INTRAVENOUS

## 2021-11-06 MED ORDER — DIPHENHYDRAMINE HCL 25 MG PO CAPS: 25.0000 mg | ORAL_CAPSULE | Freq: Four times a day (QID) | ORAL | Status: DC | PRN

## 2021-11-06 MED ORDER — PHENYLEPHRINE HCL-NACL 20-0.9 MG/250ML-% IV SOLN
INTRAVENOUS | Status: DC | PRN
  Administered 2021-11-06: 60 ug/min via INTRAVENOUS

## 2021-11-06 MED ORDER — DEXAMETHASONE SODIUM PHOSPHATE 10 MG/ML IJ SOLN
INTRAMUSCULAR | Status: DC | PRN
  Administered 2021-11-06: 10 mg via INTRAVENOUS

## 2021-11-06 MED ORDER — SENNOSIDES-DOCUSATE SODIUM 8.6-50 MG PO TABS
2.0000 | ORAL_TABLET | Freq: Every day | ORAL | Status: DC
  Administered 2021-11-07 – 2021-11-08 (×2): 2 via ORAL
  Filled 2021-11-06 (×2): qty 2

## 2021-11-06 MED ORDER — DIPHENHYDRAMINE HCL 25 MG PO CAPS: 25.0000 mg | ORAL_CAPSULE | ORAL | Status: DC | PRN

## 2021-11-06 MED ORDER — SCOPOLAMINE 1 MG/3DAYS TD PT72: 1.0000 | MEDICATED_PATCH | Freq: Once | TRANSDERMAL | Status: DC

## 2021-11-06 MED ORDER — MEPERIDINE HCL 25 MG/ML IJ SOLN: 6.2500 mg | INTRAMUSCULAR | Status: DC | PRN

## 2021-11-06 MED ORDER — NALOXONE HCL 4 MG/10ML IJ SOLN
1.0000 ug/kg/h | INTRAVENOUS | Status: DC | PRN
  Filled 2021-11-06: qty 5

## 2021-11-06 MED ORDER — ONDANSETRON HCL 4 MG/2ML IJ SOLN
INTRAMUSCULAR | Status: DC | PRN
  Administered 2021-11-06: 4 mg via INTRAVENOUS

## 2021-11-06 MED ORDER — ONDANSETRON HCL 4 MG/2ML IJ SOLN
INTRAMUSCULAR | Status: AC
  Filled 2021-11-06: qty 2

## 2021-11-06 MED ORDER — MENTHOL 3 MG MT LOZG: 1.0000 | LOZENGE | OROMUCOSAL | Status: DC | PRN

## 2021-11-06 MED ORDER — DIBUCAINE (PERIANAL) 1 % EX OINT: 1.0000 "application " | TOPICAL_OINTMENT | CUTANEOUS | Status: DC | PRN

## 2021-11-06 MED ORDER — KETOROLAC TROMETHAMINE 30 MG/ML IJ SOLN
30.0000 mg | Freq: Four times a day (QID) | INTRAMUSCULAR | Status: AC
  Administered 2021-11-06 – 2021-11-07 (×3): 30 mg via INTRAVENOUS
  Filled 2021-11-06 (×2): qty 1

## 2021-11-06 MED ORDER — ENOXAPARIN SODIUM 40 MG/0.4ML IJ SOSY
40.0000 mg | PREFILLED_SYRINGE | INTRAMUSCULAR | Status: DC
  Administered 2021-11-07 – 2021-11-08 (×2): 40 mg via SUBCUTANEOUS
  Filled 2021-11-06 (×2): qty 0.4

## 2021-11-06 MED ORDER — SODIUM CHLORIDE 0.9% FLUSH: 3.0000 mL | INTRAVENOUS | Status: DC | PRN

## 2021-11-06 MED ORDER — AMISULPRIDE (ANTIEMETIC) 5 MG/2ML IV SOLN: 10.0000 mg | Freq: Once | INTRAVENOUS | Status: DC | PRN

## 2021-11-06 MED ORDER — DIPHENHYDRAMINE HCL 50 MG/ML IJ SOLN: 12.5000 mg | INTRAMUSCULAR | Status: DC | PRN

## 2021-11-06 MED ORDER — CEFAZOLIN SODIUM-DEXTROSE 2-4 GM/100ML-% IV SOLN
2.0000 g | INTRAVENOUS | Status: AC
  Administered 2021-11-06: 2 g via INTRAVENOUS

## 2021-11-06 MED ORDER — OXYTOCIN-SODIUM CHLORIDE 30-0.9 UT/500ML-% IV SOLN
INTRAVENOUS | Status: AC
  Filled 2021-11-06: qty 500

## 2021-11-06 MED ORDER — NIFEDIPINE ER OSMOTIC RELEASE 30 MG PO TB24
30.0000 mg | ORAL_TABLET | Freq: Every day | ORAL | Status: DC
  Administered 2021-11-06 – 2021-11-08 (×3): 30 mg via ORAL
  Filled 2021-11-06 (×3): qty 1

## 2021-11-06 MED ORDER — ENOXAPARIN SODIUM 40 MG/0.4ML IJ SOSY: 40.0000 mg | PREFILLED_SYRINGE | INTRAMUSCULAR | Status: DC

## 2021-11-06 MED ORDER — POVIDONE-IODINE 10 % EX SWAB
2.0000 "application " | Freq: Once | CUTANEOUS | Status: AC
  Administered 2021-11-06: 2 via TOPICAL

## 2021-11-06 MED ORDER — DEXAMETHASONE SODIUM PHOSPHATE 10 MG/ML IJ SOLN
INTRAMUSCULAR | Status: AC
  Filled 2021-11-06: qty 1

## 2021-11-06 MED ORDER — SOD CITRATE-CITRIC ACID 500-334 MG/5ML PO SOLN
30.0000 mL | ORAL | Status: AC
  Administered 2021-11-06: 30 mL via ORAL

## 2021-11-06 MED ORDER — SCOPOLAMINE 1 MG/3DAYS TD PT72
MEDICATED_PATCH | TRANSDERMAL | Status: AC
  Filled 2021-11-06: qty 1

## 2021-11-06 MED ORDER — FENTANYL CITRATE (PF) 100 MCG/2ML IJ SOLN: 25.0000 ug | INTRAMUSCULAR | Status: DC | PRN

## 2021-11-06 MED ORDER — TRIAMCINOLONE ACETONIDE 40 MG/ML IJ SUSP
INTRAMUSCULAR | Status: AC
  Filled 2021-11-06: qty 1

## 2021-11-06 MED ORDER — KETOROLAC TROMETHAMINE 30 MG/ML IJ SOLN
INTRAMUSCULAR | Status: AC
  Filled 2021-11-06: qty 1

## 2021-11-06 MED ORDER — ZOLPIDEM TARTRATE 5 MG PO TABS: 5.0000 mg | ORAL_TABLET | Freq: Every evening | ORAL | Status: DC | PRN

## 2021-11-06 MED ORDER — NALOXONE HCL 0.4 MG/ML IJ SOLN: 0.4000 mg | INTRAMUSCULAR | Status: DC | PRN

## 2021-11-06 MED ORDER — PHENYLEPHRINE HCL-NACL 20-0.9 MG/250ML-% IV SOLN
INTRAVENOUS | Status: AC
  Filled 2021-11-06: qty 250

## 2021-11-06 MED ORDER — ONDANSETRON HCL 4 MG/2ML IJ SOLN: 4.0000 mg | Freq: Three times a day (TID) | INTRAMUSCULAR | Status: DC | PRN

## 2021-11-06 MED ORDER — ACETAMINOPHEN 500 MG PO TABS
1000.0000 mg | ORAL_TABLET | Freq: Four times a day (QID) | ORAL | Status: DC
  Administered 2021-11-06 – 2021-11-08 (×7): 1000 mg via ORAL
  Filled 2021-11-06 (×7): qty 2

## 2021-11-06 MED ORDER — WITCH HAZEL-GLYCERIN EX PADS: 1.0000 "application " | MEDICATED_PAD | CUTANEOUS | Status: DC | PRN

## 2021-11-06 MED ORDER — MORPHINE SULFATE (PF) 0.5 MG/ML IJ SOLN
INTRAMUSCULAR | Status: AC
  Filled 2021-11-06: qty 10

## 2021-11-06 MED ORDER — TRIAMCINOLONE ACETONIDE 40 MG/ML IJ SUSP
INTRAMUSCULAR | Status: DC | PRN
  Administered 2021-11-06: 40 mg via INTRAMUSCULAR

## 2021-11-06 MED ORDER — SIMETHICONE 80 MG PO CHEW
80.0000 mg | CHEWABLE_TABLET | Freq: Three times a day (TID) | ORAL | Status: DC
  Administered 2021-11-07 – 2021-11-08 (×5): 80 mg via ORAL
  Filled 2021-11-06 (×5): qty 1

## 2021-11-06 MED ORDER — BUPIVACAINE HCL 0.25 % IJ SOLN
INTRAMUSCULAR | Status: DC | PRN
  Administered 2021-11-06: 30 mL

## 2021-11-06 MED ORDER — CEFAZOLIN SODIUM-DEXTROSE 2-4 GM/100ML-% IV SOLN
INTRAVENOUS | Status: AC
  Filled 2021-11-06: qty 100

## 2021-11-06 MED ORDER — FENTANYL CITRATE (PF) 100 MCG/2ML IJ SOLN
INTRAMUSCULAR | Status: AC
  Filled 2021-11-06: qty 2

## 2021-11-06 MED ORDER — BUPIVACAINE IN DEXTROSE 0.75-8.25 % IT SOLN
INTRATHECAL | Status: DC | PRN
  Administered 2021-11-06: 1.4 mL via INTRATHECAL

## 2021-11-06 MED ORDER — ACETAMINOPHEN 500 MG PO TABS
ORAL_TABLET | ORAL | Status: AC
  Filled 2021-11-06: qty 2

## 2021-11-06 MED ORDER — COCONUT OIL OIL
1.0000 "application " | TOPICAL_OIL | Status: DC | PRN
  Administered 2021-11-08: 1 via TOPICAL

## 2021-11-06 MED ORDER — FUROSEMIDE 20 MG PO TABS
20.0000 mg | ORAL_TABLET | Freq: Every day | ORAL | Status: DC
  Administered 2021-11-07 – 2021-11-08 (×2): 20 mg via ORAL
  Filled 2021-11-06 (×2): qty 1

## 2021-11-06 MED ORDER — ACETAMINOPHEN 500 MG PO TABS
1000.0000 mg | ORAL_TABLET | Freq: Once | ORAL | Status: AC
  Administered 2021-11-06: 1000 mg via ORAL

## 2021-11-06 MED ORDER — FENTANYL CITRATE (PF) 100 MCG/2ML IJ SOLN
INTRAMUSCULAR | Status: DC | PRN
  Administered 2021-11-06: 15 ug via INTRATHECAL

## 2021-11-06 MED ORDER — SODIUM CHLORIDE (PF) 0.9 % IJ SOLN
INTRAMUSCULAR | Status: AC
  Filled 2021-11-06: qty 50

## 2021-11-06 SURGICAL SUPPLY — 33 items
BENZOIN TINCTURE PRP APPL 2/3 (GAUZE/BANDAGES/DRESSINGS) ×2 IMPLANT
CLAMP CORD UMBIL (MISCELLANEOUS) ×2 IMPLANT
CLOTH BEACON ORANGE TIMEOUT ST (SAFETY) ×2 IMPLANT
DRSG OPSITE POSTOP 4X10 (GAUZE/BANDAGES/DRESSINGS) ×2 IMPLANT
ELECT REM PT RETURN 9FT ADLT (ELECTROSURGICAL) ×2
ELECTRODE REM PT RTRN 9FT ADLT (ELECTROSURGICAL) ×1 IMPLANT
EXTRACTOR VACUUM M CUP 4 TUBE (SUCTIONS) IMPLANT
GLOVE BIOGEL PI IND STRL 7.0 (GLOVE) ×3 IMPLANT
GLOVE BIOGEL PI INDICATOR 7.0 (GLOVE) ×3
GLOVE ECLIPSE 7.0 STRL STRAW (GLOVE) ×2 IMPLANT
GOWN STRL REUS W/TWL LRG LVL3 (GOWN DISPOSABLE) ×4 IMPLANT
KIT ABG SYR 3ML LUER SLIP (SYRINGE) ×2 IMPLANT
NDL HYPO 25X5/8 SAFETYGLIDE (NEEDLE) ×1 IMPLANT
NEEDLE HYPO 22GX1.5 SAFETY (NEEDLE) ×2 IMPLANT
NEEDLE HYPO 25X5/8 SAFETYGLIDE (NEEDLE) ×2 IMPLANT
NS IRRIG 1000ML POUR BTL (IV SOLUTION) ×2 IMPLANT
PACK C SECTION WH (CUSTOM PROCEDURE TRAY) ×2 IMPLANT
PAD ABD 7.5X8 STRL (GAUZE/BANDAGES/DRESSINGS) ×2 IMPLANT
PAD ABD DERMACEA PRESS 5X9 (GAUZE/BANDAGES/DRESSINGS) ×1 IMPLANT
PAD OB MATERNITY 4.3X12.25 (PERSONAL CARE ITEMS) ×2 IMPLANT
PENCIL SMOKE EVAC W/HOLSTER (ELECTROSURGICAL) ×2 IMPLANT
RTRCTR C-SECT PINK 25CM LRG (MISCELLANEOUS) ×2 IMPLANT
STRIP CLOSURE SKIN 1/2X4 (GAUZE/BANDAGES/DRESSINGS) ×2 IMPLANT
SUT MNCRL 0 VIOLET CTX 36 (SUTURE) ×2 IMPLANT
SUT MONOCRYL 0 CTX 36 (SUTURE) ×2
SUT PLAIN 2 0 XLH (SUTURE) ×1 IMPLANT
SUT VIC AB 0 CTX 36 (SUTURE) ×1
SUT VIC AB 0 CTX36XBRD ANBCTRL (SUTURE) ×1 IMPLANT
SUT VIC AB 4-0 KS 27 (SUTURE) ×2 IMPLANT
SYR 30ML LL (SYRINGE) ×2 IMPLANT
TOWEL OR 17X24 6PK STRL BLUE (TOWEL DISPOSABLE) ×2 IMPLANT
TRAY FOLEY W/BAG SLVR 14FR LF (SET/KITS/TRAYS/PACK) ×2 IMPLANT
WATER STERILE IRR 1000ML POUR (IV SOLUTION) ×2 IMPLANT

## 2021-11-06 NOTE — Discharge Summary (Signed)
? ?  Postpartum Discharge Summary ? ?   ?Patient Name: Melissa Ruiz ?DOB: 1986/02/03 ?MRN: [id number] ? ?Date of admission: 11/06/2021 ?Delivery date:11/06/2021  ?Delivering provider: Donnamae Jude  ?Date of discharge: 11/08/2021 ? ?Admitting diagnosis: Supervision of high risk pregnancy, antepartum [O09.90] ?History of cesarean delivery [Z98.891] ?Intrauterine pregnancy: [redacted]w[redacted]d    ?Secondary diagnosis:  Principal Problem: ?  Status post repeat low transverse cesarean section ?Active Problems: ?  History of pulmonary embolus (PE) ?  History of DVT (deep vein thrombosis) ?  Supervision of high-risk pregnancy ?  GDM (gestational diabetes mellitus), class A1 ?  History of low transverse cesarean section at 29 weeks ?  Marginal insertion of umbilical cord affecting management of mother ?  Pregnancy resulting from in vitro fertilization, second trimester ?  Anticoagulated ?  Pelvic adhesive disease ?  H/O unilateral salpingectomy ?  Supervision of high risk pregnancy, antepartum ?  History of cesarean delivery ? ?Additional problems: None    ?Discharge diagnosis: Term Pregnancy Delivered, CHTN with superimposed preeclampsia, and GDM A1                                              ?Post partum procedures: Left salpingectomy (only has left tube, right tube previously removed) ?Augmentation: N/A ?Complications: None ? ?Hospital course: Sceduled C/S - 36y.o. yo G2P1102 at 38w2das admitted to the hospital 11/06/2021 for scheduled cesarean section with the following indication: Elective Repeat. Delivery details are as follows:  ?Membrane Rupture Time/Date: 12:11 PM ,11/06/2021   ?Delivery Method:C-Section, Low Transverse  ?Details of operation can be found in separate operative note.  Patient had an uncomplicated postpartum course.  She is ambulating, tolerating a regular diet, passing flatus, and urinating well.  She was continued on Lovenox postpartum due to her history of DVT, and she will continue this for a  total of 6 weeks postpartum.  She was also started on Lasix 20 mg daily and Procardia 30 mg daily for BP control. She will continue Lasix to complete a 5 day course.  She will continue Procardia for BP control, and she will have an appointment in 1 week to reassess need for ongoing treatment.  Pre-eclampsia precautions reviewed and patient voiced understanding.  Patient is discharged home in stable condition on  11/08/21 ?       ?Newborn Data: ?Birth date:11/06/2021  ?Birth time:12:11 PM  ?Gender:Female  ?Living status:Living  ?Apgars:9 ,9  ?Weight:2490 g    ? ?Magnesium Sulfate received: No ?BMZ received: No ?Rhophylac: N/A ?MMR: N/A ?T-DaP: Given prenatally ?Flu: Yes - given prenatally ?Transfusion: No ? ?Physical exam  ?Vitals:  ? 11/07/21 1545 11/07/21 1945 11/07/21 2057 11/08/21 0538  ?BP: 128/76 122/71 133/80 135/88  ?Pulse: 92  100 95  ?Resp: _0 ?Temp: 98.2 ?F (36.8 ?C)  98 ?F (36.7 ?C) 98.2 ?F (36.8 ?C)  ?TempSrc: Oral  Oral Oral  ?SpO2: 100%  99% 100%  ?Weight:      ?Height:      ? ?General: alert, cooperative, and no distress ?Lochia: appropriate ?Uterine Fundus: firm and below umbilicus  ?Incision: healing well with no significant drainage, no significant erythema, dressing is clean, dry, and intact ?DVT Evaluation: no LE edema or calf tenderness to palpation  ? ?Labs: ?Lab Results  ?Component Value Date  ? WBC 15.1 (H) 11/07/2021  ?  HGB 11.6 (L) 11/07/2021  ? HCT 34.7 (L) 11/07/2021  ? MCV 89.7 11/07/2021  ? PLT 247 11/07/2021  ? ? ?  Latest Ref Rng & Units 11/04/2021  ?  9:55 AM  ?CMP  ?Glucose 70 - 99 mg/dL 137    ?BUN 6 - 20 mg/dL 5    ?Creatinine 0.44 - 1.00 mg/dL 0.78    ?Sodium 135 - 145 mmol/L 134    ?Potassium 3.5 - 5.1 mmol/L 3.8    ?Chloride 98 - 111 mmol/L 105    ?CO2 22 - 32 mmol/L 21    ?Calcium 8.9 - 10.3 mg/dL 9.3    ?Total Protein 6.5 - 8.1 g/dL 5.9    ?Total Bilirubin 0.3 - 1.2 mg/dL 0.8    ?Alkaline Phos 38 - 126 U/L 165    ?AST 15 - 41 U/L 28    ?ALT 0 - 44 U/L 22    ? ?Edinburgh  Score: ? ?  11/08/2021  ? 10:58 AM  ?Flavia Shipper Postnatal Depression Scale Screening Tool  ?I have been able to laugh and see the funny side of things. 0  ?I have looked forward with enjoyment to things. 0  ?I have blamed myself unnecessarily when things went wrong. 0  ?I have been anxious or worried for no good reason. 0  ?I have felt scared or panicky for no good reason. 0  ?Things have been getting on top of me. 0  ?I have been so unhappy that I have had difficulty sleeping. 0  ?I have felt sad or miserable. 0  ?I have been so unhappy that I have been crying. 0  ?The thought of harming myself has occurred to me. 0  ?Edinburgh Postnatal Depression Scale Total 0  ? ? ? ?After visit meds:  ?Allergies as of 11/08/2021   ?No Known Allergies ?  ? ?  ?Medication List  ?  ? ?STOP taking these medications   ? ?aspirin EC 81 MG tablet ?  ?FreeStyle Libre 3 Sensor Misc ?  ? ?  ? ?TAKE these medications   ? ?enoxaparin 40 MG/0.4ML injection ?Commonly known as: Lovenox ?Inject 0.4 mLs (40 mg total) into the skin daily. ?  ?famotidine 20 MG tablet ?Commonly known as: PEPCID ?TAKE 1 TABLET BY MOUTH 2 TIMES DAILY ?  ?furosemide 20 MG tablet ?Commonly known as: LASIX ?Take 1 tablet (20 mg total) by mouth daily for 3 days. ?  ?multivitamin-prenatal 27-0.8 MG Tabs tablet ?Take 1 tablet by mouth in the morning. ?  ?NIFEdipine 30 MG 24 hr tablet ?Commonly known as: ADALAT CC ?Take 1 tablet (30 mg total) by mouth daily. ?  ?oxyCODONE 5 MG immediate release tablet ?Commonly known as: Oxy IR/ROXICODONE ?Take 1 tablet (5 mg total) by mouth every 6 (six) hours as needed for severe pain or breakthrough pain. ?  ? ?  ? ?  ?  ? ? ?  ?Discharge Care Instructions  ?(From admission, onward)  ?  ? ? ?  ? ?  Start     Ordered  ? 11/08/21 0000  Discharge wound care:       ?Comments: Remove dressing 5 days after your surgery date. You can then wash gently with soap and water in the shower and pat dry. You will have an incision check in about 1 week.  ?  11/08/21 1353  ? ?  ?  ? ?  ? ? ? ?Discharge home in stable condition ?Infant Feeding: Breast ?Infant Disposition: home with  mother ?Discharge instruction: per After Visit Summary and Postpartum booklet. ?Activity: Advance as tolerated. Pelvic rest for 6 weeks.  ?Diet: routine diet ?Future Appointments: ?Future Appointments  ?Date Time Provider [place]ne[place]  ?11/27/2021 11:00 AM Tobb, Godfrey Pick, DO [facility]  ? ?Follow up Visit: ?Message sent to [facility] by Dr. Cy Blamer on 3/31 ? ?Please schedule this patient for a In person postpartum visit in 4 weeks with the following provider: MD. ?Additional Postpartum F/U: 2 hour GTT, Incision check 1 week, and BP check 1 week  ?High risk pregnancy complicated by: GDM, HTN, and Bleeding disorder on Lovenox ?Delivery mode:  C-Section, Low Transverse  ?Anticipated Birth Control:  Salpingectomy done PP ? ?11/08/2021 ?Genia Del, MD ? ? ? ?

## 2021-11-06 NOTE — Transfer of Care (Signed)
Immediate Anesthesia Transfer of Care Note ? ?Patient: Melissa Ruiz ? ?Procedure(s) Performed: CESAREAN SECTION ? ?Patient Location: PACU ? ?Anesthesia Type:Spinal ? ?Level of Consciousness: awake, alert  and oriented ? ?Airway & Oxygen Therapy: Patient Spontanous Breathing ? ?Post-op Assessment: Report given to RN and Post -op Vital signs reviewed and stable ? ?Post vital signs: Reviewed and stable ? ?Last Vitals:  ?Vitals Value Taken Time  ?BP 147/97 11/06/21 1430  ?Temp 36.5 ?C 11/06/21 1310  ?Pulse 95 11/06/21 1441  ?Resp 17 11/06/21 1441  ?SpO2 100 % 11/06/21 1441  ?Vitals shown include unvalidated device data. ? ?Last Pain:  ?Vitals:  ? 11/06/21 1430  ?TempSrc:   ?PainSc: 0-No pain  ?   ? ?  ? ?Complications: No notable events documented. ?

## 2021-11-06 NOTE — Op Note (Signed)
Operative Note  ? ?Patient: Melissa Ruiz ? ?Date of Procedure: 11/06/2021 ? ?Procedure: Repeat Low Transverse Cesarean and left salpingectomy, excision of Keloid ? ?Indications: patient declines vag del attempt, elective repeat, previous uterine incision: low transverse, undesired fertility ? ?Pre-operative Diagnosis: RCS, Left Side Salpingectomy ? ?Post-operative Diagnosis: Same ? ?TOLAC Candidate: n/a ? ?Surgeon: Juliann Mule) and Role: ?   Donnamae Jude, MD - Primary ?   Renard Matter, MD - Fellow ? ?Assistants: none ? ?An experienced assistant was required given the standard of surgical care given the complexity of the case.  This assistant was needed for exposure, dissection, suctioning, retraction, instrument exchange, assisting with delivery with administration of fundal pressure, and for overall help during the procedure.  ? ?Anesthesia: spinal ? ?Anesthesiologist: Tobias Alexander ? ?Antibiotics: Cefazolin ?  ?Estimated Blood Loss: 250 ml  ? ?Total IV Fluids: 2400 ml ? ?Urine Output:  100 cc OF clear urine ? ?Specimens: Left tube to pathology  ? ?Complications: no complications  ? ?Indications: ?Melissa Ruiz is a 36 y.o. [phone] with an IUP [id number] for scheduled cesarean secondary to the indications listed above. 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 ? ? ?Findings: Viable infant in cephalic presentation, no nuchal cord present. Apgars 9 , 9  . Weight 2490 g . Clear amniotic fluid. Normal placenta, three vessel cord. Normal uterus, Normal left fallopian tubes, Surgically absent right tube. ? ?Procedure Details:  ?Patient is taken to the OR where spinal analgesia was administered. She was then placed in a supine position with left lateral tilt. She received 2 g of Ancef and SCDs were in place. A Foley catheter was placed in the bladder. She was prepped and draped in the usual sterile fashion. A timeout was performed. A  knife was then used to make a Pfannenstiel incision. A large keloid was excised and discarded. This incision was carried out to underlying fascia which was divided in the midline with the knife. The incision was extended laterally, sharply. The fascia was dissected off the underlying rectus superiorly.  The rectus was divided in the midline.  The peritoneal cavity was entered bluntly.  Alexis retractor was placed inside the incision.  A knife was used to make a low transverse incision on the uterus. This incision was carried down to the amniotic cavity was entered. Fetus was in LOA position and was brought up out of the incision without difficulty. Delayed cord clamping performed. Cord was clamped x 2 and cut. Infant taken to waiting pediatrician.  Cord blood was obtained. Placenta was delivered from the uterus.  Uterus was cleaned with dry lap pads. Uterine incision closed with 0 Monocryl suture in a locked running fashion. Attention was turned to the pt's left tube which was grasped with a Babcock clamp and followed to its fimbriated end.  Two Kelly clamps placed around tube incorporating the fimbria. Tube cut with a Metzenbaum scissors. A 1-0 Monocryl on an SH needle was used in a Heaney stitch under each clamp to achieve hemostasis.The right tube was previously removed. Alexis retractor was removed from the abdomen. Peritoneal closure was done with 0 Vicryl suture.  There was bleeding from the right rectus and electrocautery and suture was used to obtain hemostasis.  Fascia is closed with 0 Vicryl suture in a running fashion. Subcutaneous tissue infused with 30cc 0.25% Marcaine.  Subcutaneous closure was performed with 0 Plain suture.  Skin closed using 3-0  Vicryl on a Keith needle. 10 cc of Kenalog and Saline mixture injected in an attempt to prevent keloid formation. Honeycomb dressing applied, followed by pressure dressing.  All instrument, needle and lap counts were correct x 2.  Patient was awake and taken  to PACU stable.  Infant in couplet care, stable.  ? ? ? ?Signed: ?Donnamae Jude, MD ?[facility]) ? ?

## 2021-11-06 NOTE — Anesthesia Procedure Notes (Signed)
Spinal ? ?Patient location during procedure: OR ?Start time: 11/06/2021 11:44 AM ?End time: 11/06/2021 11:54 AM ?Reason for block: surgical anesthesia ?Staffing ?Performed: anesthesiologist  ?Anesthesiologist: Duane Boston, MD ?Preanesthetic Checklist ?Completed: patient identified, IV checked, risks and benefits discussed, surgical consent, monitors and equipment checked, pre-op evaluation and timeout performed ?Spinal Block ?Patient position: sitting ?Prep: DuraPrep ?Patient monitoring: cardiac monitor, continuous pulse ox and blood pressure ?Approach: midline ?Location: L2-3 ?Injection technique: single-shot ?Needle ?Needle type: Pencan  ?Needle gauge: 24 G ?Needle length: 9 cm ?Assessment ?Events: CSF return ?Additional Notes ?Functioning IV was confirmed and monitors were applied. Sterile prep and drape, including hand hygiene and sterile gloves were used. The patient was positioned and the spine was prepped. The skin was anesthetized with lidocaine.  Free flow of clear CSF was obtained prior to injecting local anesthetic into the CSF.  The spinal needle aspirated freely following injection.  The needle was carefully withdrawn.  The patient tolerated the procedure well.  ? ? ? ?

## 2021-11-06 NOTE — Anesthesia Preprocedure Evaluation (Signed)
Anesthesia Evaluation  ? ? ?Reviewed: ?Allergy & Precautions, Patient's Chart, lab work & pertinent test results ? ?Airway ?Mallampati: II ? ?TM Distance: >3 FB ?Neck ROM: Full ? ? ? Dental ?no notable dental hx. ? ?  ?Pulmonary ?PE ?PE in 2017 ?  ?Pulmonary exam normal ?breath sounds clear to auscultation ? ? ? ? ? ? Cardiovascular ?negative cardio ROS ?Normal cardiovascular exam ?Rhythm:Regular Rate:Normal ? ? ?  ?Neuro/Psych ?negative neurological ROS ? negative psych ROS  ? GI/Hepatic ?Neg liver ROS, GERD  ,  ?Endo/Other  ?negative endocrine ROS ? Renal/GU ?negative Renal ROS  ?negative genitourinary ?  ?Musculoskeletal ?negative musculoskeletal ROS ?(+)  ? Abdominal ?  ?Peds ?negative pediatric ROS ?(+)  Hematology ?negative hematology ROS ?(+)   ?Anesthesia Other Findings ? ? Reproductive/Obstetrics ?(+) Pregnancy ? ?  ? ? ? ? ? ? ? ? ? ? ? ? ? ?  ?  ? ? ? ? ? ? ? ? ?Anesthesia Physical ?Anesthesia Plan ? ?ASA: 2 ? ?Anesthesia Plan: Spinal  ? ?Post-op Pain Management: Tylenol PO (pre-op)*  ? ?Induction:  ? ?PONV Risk Score and Plan: 2 and Ondansetron and Scopolamine patch - Pre-op ? ?Airway Management Planned: Simple Face Mask ? ?Additional Equipment:  ? ?Intra-op Plan:  ? ?Post-operative Plan:  ? ?Informed Consent:  ? ?Plan Discussed with: Anesthesiologist ? ?Anesthesia Plan Comments:   ? ? ? ? ? ? ?Anesthesia Quick Evaluation ? ?

## 2021-11-06 NOTE — Anesthesia Postprocedure Evaluation (Signed)
Anesthesia Post Note ? ?Patient: Melissa Ruiz ? ?Procedure(s) Performed: CESAREAN SECTION ? ?  ? ?Patient location during evaluation: PACU ?Anesthesia Type: Spinal ?Level of consciousness: awake and alert ?Pain management: pain level controlled ?Vital Signs Assessment: post-procedure vital signs reviewed and stable ?Respiratory status: spontaneous breathing and respiratory function stable ?Cardiovascular status: blood pressure returned to baseline and stable ?Postop Assessment: spinal receding ?Anesthetic complications: no ? ? ?No notable events documented. ? ?Last Vitals:  ?Vitals:  ? 11/06/21 1345 11/06/21 1430  ?BP:    ?Pulse: (!) 101 88  ?Resp: (!) 21 18  ?Temp:    ?SpO2: 100% 100%  ?  ?Last Pain:  ?Vitals:  ? 11/06/21 1430  ?TempSrc:   ?PainSc: 0-No pain  ? ?Pain Goal:   ? ?LLE Motor Response: Purposeful movement (11/06/21 1430) ?LLE Sensation: Tingling (11/06/21 1430) ?RLE Motor Response: Purposeful movement (11/06/21 1430) ?RLE Sensation: Tingling (11/06/21 1430) ?  ?  ?Epidural/Spinal Function Cutaneous sensation: Tingles (11/06/21 1430), Patient able to flex knees: Yes (11/06/21 1430), Patient able to lift hips off bed: Yes (11/06/21 1430), Back pain beyond tenderness at insertion site: No (11/06/21 1430), Progressively worsening motor and/or sensory loss: No (11/06/21 1430), Bowel and/or bladder incontinence post epidural: No (11/06/21 1430) ? ?Maalle Starrett DANIEL ? ? ? ? ?

## 2021-11-06 NOTE — Lactation Note (Signed)
This note was copied from a baby's chart. ?Lactation Consultation Note ? ?Patient Name: Melissa Ruiz ?Today's Date: 11/06/2021 ?Reason for consult: Initial assessment;Mother's request;Difficult latch;Early term 37-38.6wks;Infant < 6lbs;Maternal endocrine disorder;Other (Comment);Breastfeeding assistance (CHTN ( Nifedipine) Vit D Def) ?Age:38 hours ? ?Infant feeding with signs of milk transfer. Mom pumped and bottle fed first time since infant in NICU. Mom feeding plan EBF. We discussed LPTI guidelines given infant 37 weeks and under 6 lbs birthweight.  ? ?Plan 1. To feed based on cues 8-12x 24hr period. Mom to offer breasts and look for signs of milk transfer.  ?2. Mom to offer more volume via spoon 5-10 ml via hand expression.  ?3. I and O sheet reviewed.  ?All questions answered at the end of the visit.  ? ?Mom like to hold off on pumping and see how feeding goes for now. Pump placed in a her room but not opened as per her request.  ?Infant initial glucose good from latch in L and D.  ? ?Maternal Data ?Has patient been taught Hand Expression?: Yes ?Does the patient have breastfeeding experience prior to this delivery?: Yes ?How long did the patient breastfeed?: Infant in NICU pumped and bottle fed along with Formula for 6 months ? ?Feeding ?Mother's Current Feeding Choice: Breast Milk ? ?LATCH Score ?Latch: Repeated attempts needed to sustain latch, nipple held in mouth throughout feeding, stimulation needed to elicit sucking reflex. ? ?Audible Swallowing: Spontaneous and intermittent ? ?Type of Nipple: Everted at rest and after stimulation ? ?Comfort (Breast/Nipple): Soft / non-tender ? ?Hold (Positioning): Assistance needed to correctly position infant at breast and maintain latch. ? ?LATCH Score: 8 ? ? ?Lactation Tools Discussed/Used ?  ? ?Interventions ?Interventions: Breast feeding basics reviewed;Assisted with latch;Skin to skin;Breast massage;Hand express;Breast compression;Adjust position;Support  pillows;Position options;Expressed milk;Education;LC Magazine features editor;Infant Driven Feeding Algorithm education;LPT handout/interventions ? ?Discharge ?Pump: Personal ?[place] Program: No ? ?Consult Status ?Consult Status: Follow-up ?Date: 11/07/21 ?Follow-up type: In-patient ? ? ? ?Huntington Leverich  Nicholson-Springer ?11/06/2021, 6:45 PM ? ? ? ?

## 2021-11-07 ENCOUNTER — Encounter (HOSPITAL_COMMUNITY): Payer: Self-pay | Admitting: Family Medicine

## 2021-11-07 LAB — CBC
HCT: 34.7 % — ABNORMAL LOW (ref 36.0–46.0)
Hemoglobin: 11.6 g/dL — ABNORMAL LOW (ref 12.0–15.0)
MCH: 30 pg (ref 26.0–34.0)
MCHC: 33.4 g/dL (ref 30.0–36.0)
MCV: 89.7 fL (ref 80.0–100.0)
Platelets: 247 10*3/uL (ref 150–400)
RBC: 3.87 MIL/uL (ref 3.87–5.11)
RDW: 15.7 % — ABNORMAL HIGH (ref 11.5–15.5)
WBC: 15.1 10*3/uL — ABNORMAL HIGH (ref 4.0–10.5)
nRBC: 0 % (ref 0.0–0.2)

## 2021-11-07 MED ORDER — FAMOTIDINE 20 MG PO TABS
20.0000 mg | ORAL_TABLET | Freq: Two times a day (BID) | ORAL | Status: DC
  Administered 2021-11-07 – 2021-11-08 (×3): 20 mg via ORAL
  Filled 2021-11-07 (×3): qty 1

## 2021-11-07 NOTE — Lactation Note (Signed)
This note was copied from a baby's chart. ?Lactation Consultation Note ? ?Patient Name: Melissa Ruiz ?Today's Date: 11/07/2021 ?Reason for consult: Follow-up assessment;Early term 37-38.6wks;Infant < 6lbs;Maternal endocrine disorder (History of infertility and IVF) ?Age:38 hours ? ?LC in to visit with P2 Mom of ET infant weighing 5 lbs.7.8 oz at birth.  Baby is down 3.8% from birthweight.  Baby has breastfed 5 times in last 39 hrs and had 2 donor breast milk supplements of 5 ml each due to being fussy and not latching.  Baby has had 3 voids and 1 stool in last 24 hrs. ? ?Baby sleeping currently STS on Mom's chest.  Baby fed last >2 hrs ago.  Mom has pumped once this am, wasn't able to express any colostrum at that time.   ? ?(NT in to do hearing screen) ? ?Plan recommended- ?1- Keep baby STS as much as possible ?2- At least every 3 hrs, awakening as needed, breastfeed baby ?3-Offer supplement of EBM+/donor breast milk volume per LPTI guidelines.  Recommended baby take 10-20 ml today. ?4-Pump both breasts 15 min on initiation setting ? ?Encouraged Mom to ask for assistance at next feeding for LC to assess baby's latch. ? 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  Mom desires to wait until tomorrow. ? ? ?Lactation Tools Discussed/Used ?Tools: Pump;Flanges;Bottle ?Flange Size: 24 ?Breast pump type: Double-Electric Breast Pump ?Pump Education: Setup, frequency, and cleaning;Milk Storage ?Reason for Pumping: Support milk supply/ET infant/<6 lbs ?Pumping frequency: Mom encouraged to double pump after breastfeeding baby ?Pumped volume: 0 mL ? ?Interventions ?Interventions: Breast feeding basics reviewed;Skin to skin;Breast massage;Hand express;DEBP;Pace feeding ? ?Consult Status ?Consult Status: Follow-up ?Date: 11/08/21 ?Follow-up type: In-patient ? ? ? ?Tilda Burrow E ?11/07/2021, 1:44 PM ? ? ? ?

## 2021-11-07 NOTE — Progress Notes (Signed)
Subjective: ?Postpartum Day 1: Cesarean Delivery ?Patient reports tolerating PO and no problems voiding.   ? ?Objective: ?Vital signs in last 24 hours: ?Temp:  [98 ?F (36.7 ?C)-98.2 ?F (36.8 ?C)] 98 ?F (36.7 ?C) (04/01 0507) ?Pulse Rate:  [88-104] 91 (04/01 0507) ?Resp:  [15-21] 16 (04/01 0507) ?BP: (118-152)/(66-87) 127/73 (04/01 0507) ?SpO2:  [97 %-100 %] 98 % (04/01 0223) ? ?Physical Exam:  ?General: alert, cooperative, and no distress ?Lochia: appropriate ?Uterine Fundus: firm ?Incision: pressure dressing removed, honeycomb 2/3 saturated and peeling up at posterior edge so removed and redressed with new honeycomb by RN. Incision well approximated without edema, erythema, or exudate. ?DVT Evaluation: No evidence of DVT seen on physical exam. ? ?Recent Labs  ?  11/07/21 ?8101  ?HGB 11.6*  ?HCT 34.7*  ? ? ?Assessment/Plan: ?Status post Cesarean section. Doing well postoperatively.  ?Continue current care. ? ?Fatima Blank ?11/07/2021, 1:22 PM ? ? ?

## 2021-11-08 MED ORDER — OXYCODONE HCL 5 MG PO TABS
5.0000 mg | ORAL_TABLET | Freq: Four times a day (QID) | ORAL | 0 refills | Status: DC | PRN
Start: 2021-11-08 — End: 2021-12-07
  Filled 2021-11-08: qty 12, 3d supply, fill #0

## 2021-11-08 MED ORDER — NIFEDIPINE ER 30 MG PO TB24
30.0000 mg | ORAL_TABLET | Freq: Every day | ORAL | 1 refills | Status: DC
  Filled 2021-11-08: qty 30, 30d supply, fill #0

## 2021-11-08 MED ORDER — FUROSEMIDE 20 MG PO TABS
20.0000 mg | ORAL_TABLET | Freq: Every day | ORAL | 0 refills | Status: DC
  Filled 2021-11-08: qty 3, 3d supply, fill #0

## 2021-11-08 NOTE — Lactation Note (Signed)
This note was copied from a baby's chart. ?Lactation Consultation Note ? ?Patient Name: Melissa Ruiz ?Today's Date: 11/08/2021 ?Reason for consult: Follow-up assessment;1st time breastfeeding;Early term 37-38.6wks;Infant < 6lbs;Difficult latch;Infant weight loss ?Age:38 hours ? ?LC in to visit with P2 Mom of ET infant on day of discharge.  Baby is down 7.2% from birth weight.  Bilirubin ok, output ok ? ?Mom had been exclusively breastfeeding, declined pumping initially.  Baby became fussy and not latching to the breast, and donor milk supplementation started after 12 hrs.  Mom was set up with a double pump at close to 24 hrs of age.  Mom was saying that baby was latching well initially.   ? ?Today, baby is being regularly supplemented with now 22 cal formula.  Baby's weight this am was 5 lbs.1.5 oz.  Reassured Mom that most babies need help temporarily at this weight.   ? ?Assisted Mom to pump as she was complaining about flanges hurting.  LC switched from 24 to 27 mm flanges for a better fit.  Mom pumping and obtaining drops.  Baby started cueing and offered to assist with positioning and latching baby to the breast.   ? ?Baby trying to latch with Mom using cross cradle hold.  Baby's mouth not able to sustain a deep areolar latch.  Baby's mouth is small and Mom has an ample size nipple.  Reassured Mom that this was temporary and baby would grow and soon be able to sustain a deep latch.  Mom encouraged to pump consistently to support her milk supply. ? ?Engorgement prevention and treatment reviewed. ?Encouraged STS with baby and awakening baby at least every 3 hrs for feeding.  Baby should be fed sooner if she is cueing.  ?LC recommended OP lactation f/u.  Message sent to clinic.  ? ?LPTI guidelines reviewed.  Baby fed Neosure formula and took 40 ml within 2 minutes.  Baby tolerated the flow well, no gagging, no leaking of milk. ? ?Encouraged Mom to call prn. ? ? ? ?LATCH Score ?Latch: Repeated attempts  needed to sustain latch, nipple held in mouth throughout feeding, stimulation needed to elicit sucking reflex. ? ?Audible Swallowing: None ? ?Type of Nipple: Everted at rest and after stimulation (large nipple length and diameter) ? ?Comfort (Breast/Nipple): Soft / non-tender ? ?Hold (Positioning): Assistance needed to correctly position infant at breast and maintain latch. ? ?LATCH Score: 6 ? ? ?Lactation Tools Discussed/Used ?Tools: Pump;Flanges;Coconut oil;Bottle ?Flange Size: 27 ?Breast pump type: Double-Electric Breast Pump;Manual ?Pump Education: Setup, frequency, and cleaning ?Reason for Pumping: Support milk supply/infant <6 lbs and early term/dificult latch to breast ?Pumping frequency: Mom encouraged to pump every 2-3 hrs ?Pumped volume: 0 mL (drops) ? ?Interventions ?Interventions: Breast feeding basics reviewed;Assisted with latch;Skin to skin;Breast massage;Hand express;Adjust position;Support pillows;Position options;Pre-pump if needed;Coconut oil;DEBP;Pace feeding ? ?Discharge ?Discharge Education: Engorgement and breast care;Warning signs for feeding baby;Outpatient recommendation;Outpatient Epic message sent ?Pump: Employee Pump ?[place] Program: No ? ?Consult Status ?Consult Status: Complete ?Date: 11/08/21 ?Follow-up type: Call as needed ? ? ? ?Tilda Burrow E ?11/08/2021, 12:46 PM ? ? ? ?

## 2021-11-09 ENCOUNTER — Other Ambulatory Visit (HOSPITAL_COMMUNITY): Payer: Self-pay

## 2021-11-09 LAB — SURGICAL PATHOLOGY

## 2021-11-12 ENCOUNTER — Ambulatory Visit (INDEPENDENT_AMBULATORY_CARE_PROVIDER_SITE_OTHER): Payer: 59

## 2021-11-12 ENCOUNTER — Other Ambulatory Visit (HOSPITAL_COMMUNITY): Payer: Self-pay

## 2021-11-12 ENCOUNTER — Encounter: Payer: 59 | Admitting: Obstetrics & Gynecology

## 2021-11-12 VITALS — Wt 159.8 lb

## 2021-11-12 DIAGNOSIS — O119 Pre-existing hypertension with pre-eclampsia, unspecified trimester: Secondary | ICD-10-CM

## 2021-11-12 DIAGNOSIS — Z98891 History of uterine scar from previous surgery: Secondary | ICD-10-CM

## 2021-11-12 MED ORDER — LABETALOL HCL 200 MG PO TABS
200.0000 mg | ORAL_TABLET | Freq: Two times a day (BID) | ORAL | 3 refills | Status: DC
  Filled 2021-11-12: qty 60, 30d supply, fill #0
  Filled 2021-12-21: qty 60, 30d supply, fill #1
  Filled 2022-02-04: qty 60, 30d supply, fill #2

## 2021-11-12 NOTE — Progress Notes (Signed)
Pt here today for PP Blood pressure check. Pt denies any headaches, dizziness, visual changes. No swelling noted at today's visit. Pt currently taking Nifedipine 30 mg daily. Pt states took last dose last night.  ? ?BP today: 140/89 ? ?Dr Ilda Basset in to see patient. Pt advised to change to Rx Labetalol '200mg'$  BID and stop Rx Procardia and have a recheck for BP in 1 week. Rx Labetalol sent to pharmacy. ? ?Pt also here today for wound check. Pt had repeat C-section on 11/06/2021.  ? ?Pt denies any drainage, redness or fever to site.  ?Incision today is clean, dry, intact and well approximated. Pt had removed surgical dressing yesterday at home. Pt tolerated well. Pt also advised to have steri strips placed on incision to reinforce tension to complete healing. No swelling, drainage noted at incision site today.  Steri-strips placed and pt tolerated well. Pt to remove by 1 week or at OV on 4/13 with BP check.  ? ?Pt advised to keep clean and dry. Pt verbalized understanding.  ? ?Pt has scheduled PP visit on 12/07/2021 with PP 2 hr GTT/ss.  ? Colletta Maryland, RN   ?

## 2021-11-16 DIAGNOSIS — Z98891 History of uterine scar from previous surgery: Secondary | ICD-10-CM

## 2021-11-19 ENCOUNTER — Ambulatory Visit (INDEPENDENT_AMBULATORY_CARE_PROVIDER_SITE_OTHER): Payer: 59

## 2021-11-19 VITALS — BP 122/73 | HR 87 | Wt 157.3 lb

## 2021-11-19 DIAGNOSIS — Z5189 Encounter for other specified aftercare: Secondary | ICD-10-CM

## 2021-11-19 DIAGNOSIS — Z0130 Encounter for examination of blood pressure without abnormal findings: Secondary | ICD-10-CM

## 2021-11-20 NOTE — Progress Notes (Signed)
Blood Pressure and Incision Check Visit ? ?Melissa Ruiz is here for blood pressure check following c-section on 11/06/21. Pt was diagnosed with chronic hypertension with superimposed pre-eclampsia and discharged on Nifedipine. BP med changed to Labetalol 200 mg BID on 11/12/21. Pt reports headaches have completely resolved and BP continues to be WNL at home. BP today is 122/73. Return precautions reviewed with patient.  ? ?Steri strips removed from c-section incision. Incision is clean, dry, and intact. Reviewed good wound care and s/s of infection.  ? ?Patient to continue Labetalol as prescribed and will return for PP visit on 12/07/21 or before if needed. ? ?11/20/2021  1:18 PM ? ?

## 2021-11-26 ENCOUNTER — Other Ambulatory Visit (HOSPITAL_COMMUNITY): Payer: Self-pay

## 2021-11-27 ENCOUNTER — Telehealth: Payer: Self-pay

## 2021-11-27 ENCOUNTER — Encounter: Payer: Self-pay | Admitting: Cardiology

## 2021-11-27 ENCOUNTER — Telehealth (INDEPENDENT_AMBULATORY_CARE_PROVIDER_SITE_OTHER): Payer: 59 | Admitting: Cardiology

## 2021-11-27 VITALS — BP 121/82 | HR 83 | Ht 65.0 in | Wt 157.0 lb

## 2021-11-27 DIAGNOSIS — O165 Unspecified maternal hypertension, complicating the puerperium: Secondary | ICD-10-CM

## 2021-11-27 NOTE — Progress Notes (Signed)
?[facility] ? ?Follow Up Note ? ? ?Date:  11/27/2021  ? ?ID:  Melissa Ruiz, DOB 12-20-1985, MRN [id number] ? ?PCP:  Billie Ruddy, MD ?  ?[facility] Providers ?Cardiologist:  Berniece Salines, DO  ?Electrophysiologist:  None      ? ? ?Referring MD: Billie Ruddy, MD  ? ?Chief Complaint: I am doing fine ? ?The patient is at home. ?I am in the office. ? ?Virtual Visit via Video  Note . I connected with the patient today by a   video enabled telemedicine application and verified that I am speaking with the correct person using two identifiers. ? ? ? ?History of Present Illness:   ? ?Melissa Ruiz is a 36 y.o. female [[id number]] who returns for follow up of postpartum hypertension. ? 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  She initially started on antihypertensive medications but was able to wean off.  She also has history of PE/DVT which was provoked and she completed 6 weeks of Xarelto.   ? ?During her pregnancy her blood pressure was under control trolled.  However during her postpartum her blood pressure did get elevated she was started on nifedipine but did not tolerate that medication and have been transitioned to labetalol. ?Has been on labetalol blood pressure has been a lot better. ? ? ?Prior CV Studies Reviewed: ?The following studies were reviewed today: ? ? ?Past Medical History:  ?Diagnosis Date  ? Anemia   ? DVT (deep venous thrombosis) ([place]) 2017  ? Endometriosis 01/08/2021  ? Essential hypertension 09/16/2014  ? History of placenta previa 05/14/2021  ? Not seen on 2022 pregnancy  ? Hyperlipidemia   ? Pregnancy resulting from in vitro fertilization, antepartum 04/12/2018  ? Patient of Dr. Kerin Perna.  EDD 11/10/2018 by IVF dating.  '[x]'$  fetal echo: normal except known PACs  ? Pulmonary embolism ([place]) 09/2015  ? Vitamin D deficiency 08/12/2017  ? ? ?Past Surgical History:  ?Procedure Laterality Date  ? CESAREAN SECTION N/A  08/27/2018  ? Procedure: CESAREAN SECTION;  Surgeon: Woodroe Mode, MD;  Location: [place];  Service: Obstetrics;  Laterality: N/A;  ? CESAREAN SECTION N/A 11/06/2021  ? Procedure: CESAREAN SECTION;  Surgeon: Donnamae Jude, MD;  [facility]: [facility];  Service: Obstetrics;  Laterality: N/A;  ? CHOLECYSTECTOMY    ? DIAGNOSTIC LAPAROSCOPY  11/2020  ? LOA, right salpingectoy  ?   ? ?OB History   ? ? Gravida  ?2  ? Para  ?2  ? Term  ?1  ? Preterm  ?1  ? AB  ?0  ? Living  ?2  ?  ? ? SAB  ?0  ? IAB  ?0  ? Ectopic  ?0  ? Multiple  ?0  ? Live Births  ?2  ?   ?  ?  ?    ? ? ?Current Medications: ?Current Meds  ?Medication Sig  ? enoxaparin (LOVENOX) 40 MG/0.4ML injection Inject 0.4 mLs (40 mg total) into the skin daily.  ? famotidine (PEPCID) 20 MG tablet TAKE 1 TABLET BY MOUTH 2 TIMES DAILY  ? labetalol (NORMODYNE) 200 MG tablet Take 1 tablet by mouth 2 times daily.  ? Prenatal Vit-Fe Fumarate-FA (MULTIVITAMIN-PRENATAL) 27-0.8 MG TABS tablet Take 1 tablet by mouth in the morning.  ?  ? ?Allergies:   Patient has no known allergies.  ? ?Social History  ? ?Socioeconomic History  ? Marital status: Married  ?  Spouse name: Not on  file  ? Number of children: Not on file  ? Years of education: Not on file  ? Highest education level: Not on file  ?Occupational History  ? Not on file  ?Tobacco Use  ? Smoking status: Never  ? Smokeless tobacco: Never  ?Vaping Use  ? Vaping Use: Never used  ?Substance and Sexual Activity  ? Alcohol use: Not Currently  ?  Alcohol/week: 0.0 standard drinks  ?  Comment: rarely  ? Drug use: No  ? Sexual activity: Yes  ?  Birth control/protection: None  ?Other Topics Concern  ? Not on file  ?Social History Narrative  ? Not on file  ? ?Social Determinants of Health  ? ?Financial Resource Strain: Low Risk   ? Difficulty of Paying Living Expenses: Not hard at all  ?Food Insecurity: No Food Insecurity  ? Worried About Charity fundraiser in the Last Year: Never true  ? Ran Out of Food in the Last  Year: Never true  ?Transportation Needs: No Transportation Needs  ? Lack of Transportation (Medical): No  ? Lack of Transportation (Non-Medical): No  ?Physical Activity: Not on file  ?Stress: Not on file  ?Social Connections: Not on file  ?  ? ? ?Family History  ?Problem Relation Age of Onset  ? Hypertension Mother   ? Hypertension Father   ? Diabetes Father   ? Cancer Father   ?   ? ?ROS:   ?Please see the history of present illness.    ? ?All other systems reviewed and are negative. ? ? ?Labs/EKG Reviewed:   ? ?EKG:   ?EKG is was not ordered today.   ? ?Recent Labs: ?05/14/2021: TSH 0.609 ?11/04/2021: ALT 22; BUN 5; Creatinine, Ser 0.78; Potassium 3.8; Sodium 134 ?11/07/2021: Hemoglobin 11.6; Platelets 247  ? ?Recent Lipid Panel ?Lab Results  ?Component Value Date/Time  ? CHOL 202 (H) 10/29/2020 10:49 AM  ? TRIG 73.0 10/29/2020 10:49 AM  ? HDL 63.60 10/29/2020 10:49 AM  ? CHOLHDL 3 10/29/2020 10:49 AM  ? LDLCALC 123 (H) 10/29/2020 10:49 AM  ? ? ?Physical Exam:   ? ?VS:  BP 121/82   Pulse 83   Ht '5\' 5"'$  (1.651 m)   Wt 157 lb (71.2 kg)   LMP  (LMP Unknown)   BMI 26.13 kg/m?    ? ?Wt Readings from Last 3 Encounters:  ?11/27/21 157 lb (71.2 kg)  ?11/19/21 157 lb 4.8 oz (71.4 kg)  ?11/12/21 159 lb 12.8 oz (72.5 kg)  ?  ? ? ?Risk Assessment/Risk Calculators:   ? ?  ?  ?   ?  ?  ? ? ?ASSESSMENT & PLAN:   ? ?Chronic hypertension with accelerated hypertension in the postpartum. ?We will continue the patient on current dose of labetalol.  Plan to start to titrate off after 12 weeks which will be close to 4 months postpartum. ? ? ?Patient Instructions  ?Medication Instructions:  ?Your physician recommends that you continue on your current medications as directed. Please refer to the Current Medication list given to you today.  ?*If you need a refill on your cardiac medications before your next appointment, please call your pharmacy* ? ? ?Lab Work: ?None ?If you have labs (blood work) drawn today and your tests are completely  normal, you will receive your results only by: ?MyChart Message (if you have MyChart) OR ?A paper copy in the mail ?If you have any lab test that is abnormal or we need to change your treatment, we  will call you to review the results. ? ? ?Testing/Procedures: ?None ? ? ?Follow-Up: ?At [facility], you and your health needs are our priority.  As part of our continuing mission to provide you with exceptional heart care, we have created designated Provider Care Teams.  These Care Teams include your primary Cardiologist (physician) and Advanced Practice Providers (APPs -  Physician Assistants and Nurse Practitioners) who all work together to provide you with the care you need, when you need it. ? ?We recommend signing up for the patient portal called "MyChart".  Sign up information is provided on this After Visit Summary.  MyChart is used to connect with patients for Virtual Visits (Telemedicine).  Patients are able to view lab/test results, encounter notes, upcoming appointments, etc.  Non-urgent messages can be sent to your provider as well.   ?To learn more about what you can do with MyChart, go to [email or web address].   ? ?Your next appointment:   ?3 month(s) ? ?The format for your next appointment:   ?In Person ? ?Provider:   ?Berniece Salines, DO   ? ?Important Information About Sugar ? ? ? ? ?  ? ? ?Dispo:  No follow-ups on file.  ? ?Medication Adjustments/Labs and Tests Ordered: ?Current medicines are reviewed at length with the patient today.  Concerns regarding medicines are outlined above.  ?Tests Ordered: ?No orders of the defined types were placed in this encounter. ? ?Medication Changes: ?No orders of the defined types were placed in this encounter. ? ?

## 2021-11-27 NOTE — Patient Instructions (Signed)
Medication Instructions:  ?Your physician recommends that you continue on your current medications as directed. Please refer to the Current Medication list given to you today.  ?*If you need a refill on your cardiac medications before your next appointment, please call your pharmacy* ? ? ?Lab Work: ?None ?If you have labs (blood work) drawn today and your tests are completely normal, you will receive your results only by: ?MyChart Message (if you have MyChart) OR ?A paper copy in the mail ?If you have any lab test that is abnormal or we need to change your treatment, we will call you to review the results. ? ? ?Testing/Procedures: ?None ? ? ?Follow-Up: ?At [facility], you and your health needs are our priority.  As part of our continuing mission to provide you with exceptional heart care, we have created designated Provider Care Teams.  These Care Teams include your primary Cardiologist (physician) and Advanced Practice Providers (APPs -  Physician Assistants and Nurse Practitioners) who all work together to provide you with the care you need, when you need it. ? ?We recommend signing up for the patient portal called "MyChart".  Sign up information is provided on this After Visit Summary.  MyChart is used to connect with patients for Virtual Visits (Telemedicine).  Patients are able to view lab/test results, encounter notes, upcoming appointments, etc.  Non-urgent messages can be sent to your provider as well.   ?To learn more about what you can do with MyChart, go to [email or web address].   ? ?Your next appointment:   ?3 month(s) ? ?The format for your next appointment:   ?In Person ? ?Provider:   ?Berniece Salines, DO   ? ?Important Information About Sugar ? ? ? ? ?  ?

## 2021-11-27 NOTE — Telephone Encounter (Signed)
Called pt went over after visit summary and set up her follow up appointment.  ?

## 2021-12-02 ENCOUNTER — Other Ambulatory Visit (HOSPITAL_COMMUNITY): Payer: Self-pay

## 2021-12-07 ENCOUNTER — Other Ambulatory Visit: Payer: 59

## 2021-12-07 ENCOUNTER — Encounter: Payer: Self-pay | Admitting: Obstetrics and Gynecology

## 2021-12-07 ENCOUNTER — Ambulatory Visit (INDEPENDENT_AMBULATORY_CARE_PROVIDER_SITE_OTHER): Payer: 59 | Admitting: Obstetrics and Gynecology

## 2021-12-07 VITALS — BP 122/87 | HR 94 | Ht 65.0 in | Wt 157.9 lb

## 2021-12-07 DIAGNOSIS — I10 Essential (primary) hypertension: Secondary | ICD-10-CM | POA: Insufficient documentation

## 2021-12-07 DIAGNOSIS — Z8632 Personal history of gestational diabetes: Secondary | ICD-10-CM | POA: Insufficient documentation

## 2021-12-07 DIAGNOSIS — Z98891 History of uterine scar from previous surgery: Secondary | ICD-10-CM

## 2021-12-07 DIAGNOSIS — O24410 Gestational diabetes mellitus in pregnancy, diet controlled: Secondary | ICD-10-CM | POA: Diagnosis not present

## 2021-12-07 DIAGNOSIS — Z9079 Acquired absence of other genital organ(s): Secondary | ICD-10-CM

## 2021-12-07 DIAGNOSIS — Z7901 Long term (current) use of anticoagulants: Secondary | ICD-10-CM

## 2021-12-07 NOTE — Patient Instructions (Signed)

## 2021-12-07 NOTE — Progress Notes (Addendum)
? ? ?  Post Partum Visit Note ? ?Melissa Ruiz is a 36 y.o. G53P1102 female who presents for a postpartum visit. She is 4 weeks postpartum following a repeat cesarean section.  I have fully reviewed the prenatal and intrapartum course. The delivery was at 37/2 gestational weeks.  Anesthesia: spinal. Postpartum course has been unremarkable. Baby is doing well. Baby is feeding by both breast and bottle - [place] Good Start. Bleeding staining only. Bowel function is normal. Bladder function is normal. Patient is not sexually active. Contraception method is  Salpinectomy . Postpartum depression screening: negative. ? ? ?The pregnancy intention screening data noted above was reviewed. Potential methods of contraception were discussed. The patient elected to proceed with No data recorded. ? ? ? ?Health Maintenance Due  ?Topic Date Due  ? URINE MICROALBUMIN  Never done  ? ? ?The following portions of the patient's history were reviewed and updated as appropriate: allergies, current medications, past family history, past medical history, past social history, past surgical history, and problem list. ? ?Review of Systems ?Pertinent items noted in HPI and remainder of comprehensive ROS otherwise negative. ? ?Objective:  ?LMP  (LMP Unknown)   ? ?General:  alert  ? Breasts:  not indicated  ?Lungs: clear to auscultation bilaterally  ?Heart:  regular rate and rhythm, S1, S2 normal, no murmur, click, rub or gallop  ?Abdomen: soft, non-tender; bowel sounds normal; no masses,  no organomegaly   ?Wound well approximated incision  ?GU exam:  not indicated  ?     ?Assessment:  ? ? There are no diagnoses linked to this encounter. ? ?NL postpartum exam.  ?CHTN ?H/O GDM ?H/O PE/DVT ?Plan:  ? ?Essential components of care per ACOG recommendations: ? ?1.  Mood and well being: Patient with negative depression screening today. Reviewed local resources for support.  ?- Patient tobacco use? No.   ?- hx of drug use? No.   ? ?2.  Infant care and feeding:  ?-Patient currently breastmilk feeding? No.  ?-Social determinants of health (SDOH) reviewed in EPIC. No concerns ? ?3. Sexuality, contraception and birth spacing ?- Patient does not want a pregnancy in the next year.  Desired family size is completed  ?- Reviewed reproductive life planning. Reviewed contraceptive methods based on pt preferences and effectiveness.   ? ?4. Sleep and fatigue ?-Encouraged family/partner/community support of 4 hrs of uninterrupted sleep to help with mood and fatigue ? ?5. Physical Recovery  ?- Discussed patients delivery and complications. She describes her labor as good. ?- Patient had a C-section. . Patient expressed understanding ?- Patient has urinary incontinence? No. ?- Patient is safe to resume physical and sexual activity ? ?6.  Health Maintenance ?- HM due items addressed Yes ?- Last pap smear  ?Diagnosis  ?Date Value Ref Range Status  ?09/08/2020   Final  ? - Negative for intraepithelial lesion or malignancy (NILM)  ? Pap smear not done at today's visit.  ?-Breast Cancer screening indicated? No.  ? ?7. Chronic Disease/Pregnancy Condition follow up:  ? ?Has already see OB cards and weaning off BP meds. Has F/U appt already schedule ?Will complete 6 weeks of Lovenox. ?2 hr GTT today ? ?Arlina Robes, MD ?[facility] [place], [place]  ?

## 2021-12-08 LAB — GLUCOSE TOLERANCE, 2 HOURS
Glucose, 2 hour: 137 mg/dL (ref 70–139)
Glucose, GTT - Fasting: 76 mg/dL (ref 70–99)

## 2021-12-11 ENCOUNTER — Other Ambulatory Visit: Payer: 59

## 2021-12-21 ENCOUNTER — Other Ambulatory Visit (HOSPITAL_COMMUNITY): Payer: Self-pay

## 2021-12-23 ENCOUNTER — Other Ambulatory Visit (HOSPITAL_COMMUNITY): Payer: Self-pay

## 2021-12-23 ENCOUNTER — Other Ambulatory Visit: Payer: Self-pay | Admitting: Obstetrics & Gynecology

## 2021-12-23 DIAGNOSIS — B3731 Acute candidiasis of vulva and vagina: Secondary | ICD-10-CM

## 2021-12-23 MED ORDER — TERCONAZOLE 80 MG VA SUPP
80.0000 mg | Freq: Every day | VAGINAL | 2 refills | Status: DC
  Filled 2021-12-23: qty 3, 3d supply, fill #0
  Filled 2022-01-12: qty 3, 3d supply, fill #1

## 2021-12-23 NOTE — Progress Notes (Signed)
Patient called and requested terconazole suppositories for treatment of vulvovaginal yeast infection.  This was prescribed as requested. ? ? ?[name], MD ? ?

## 2021-12-24 ENCOUNTER — Other Ambulatory Visit (HOSPITAL_COMMUNITY): Payer: Self-pay

## 2022-01-12 ENCOUNTER — Other Ambulatory Visit (HOSPITAL_COMMUNITY): Payer: Self-pay

## 2022-01-13 ENCOUNTER — Other Ambulatory Visit (HOSPITAL_COMMUNITY): Payer: Self-pay

## 2022-02-04 ENCOUNTER — Other Ambulatory Visit (HOSPITAL_COMMUNITY): Payer: Self-pay

## 2022-03-05 ENCOUNTER — Other Ambulatory Visit (HOSPITAL_COMMUNITY): Payer: Self-pay

## 2022-03-05 ENCOUNTER — Encounter: Payer: Self-pay | Admitting: Cardiology

## 2022-03-05 ENCOUNTER — Ambulatory Visit (INDEPENDENT_AMBULATORY_CARE_PROVIDER_SITE_OTHER): Payer: 59 | Admitting: Cardiology

## 2022-03-05 VITALS — BP 114/78 | HR 92 | Ht 65.0 in | Wt 169.4 lb

## 2022-03-05 DIAGNOSIS — I10 Essential (primary) hypertension: Secondary | ICD-10-CM

## 2022-03-05 DIAGNOSIS — E782 Mixed hyperlipidemia: Secondary | ICD-10-CM | POA: Diagnosis not present

## 2022-03-05 MED ORDER — LABETALOL HCL 200 MG PO TABS
200.0000 mg | ORAL_TABLET | Freq: Every evening | ORAL | 3 refills | Status: DC
  Filled 2022-03-05 – 2022-04-13 (×2): qty 90, 90d supply, fill #0
  Filled 2022-07-13: qty 90, 90d supply, fill #1
  Filled 2022-10-18: qty 90, 90d supply, fill #2

## 2022-03-05 NOTE — Progress Notes (Signed)
Cardio-Obstetrics Clinic  Follow Up Note   Date:  03/05/2022   ID:  Melissa Ruiz, Melissa Ruiz June 06, 1986, MRN [id number]  PCP:  Billie Ruddy, MD   [facility] Providers Cardiologist:  Berniece Salines, DO  Electrophysiologist:  None        Referring MD: Billie Ruddy, MD   Chief Complaint: " I am doing well"  History of Present Illness:    Melissa Ruiz is a 36 y.o. female [[id number]] who returns for follow up of postpartum hypertension.  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  She initially started on antihypertensive medications but was able to wean off.  She also has history of PE/DVT which was provoked and she completed 6 weeks of Xarelto.    I did see the patient virtually on November 27, 2021.  At that time we continued her labetalol.  We discussed that we will going to titrate this down to eventually.  She has been experiencing some hypotension therefore she cut back on the labetalol and now 200 mg daily.  I agree with this.  She offers no complaints. She is back to work part time.   Prior CV Studies Reviewed: The following studies were reviewed today:   Past Medical History:  Diagnosis Date   Anemia    DVT (deep venous thrombosis) ([place]) 2017   Endometriosis 01/08/2021   Essential hypertension 09/16/2014   History of placenta previa 05/14/2021   Not seen on 2022 pregnancy   Hyperlipidemia    Pregnancy resulting from in vitro fertilization, antepartum 04/12/2018   Patient of Dr. Kerin Perna.  EDD 11/10/2018 by IVF dating.  '[x]'$  fetal echo: normal except known PACs   Pulmonary embolism ([place]) 09/2015   Vitamin D deficiency 08/12/2017    Past Surgical History:  Procedure Laterality Date   CESAREAN SECTION N/A 08/27/2018   Procedure: CESAREAN SECTION;  Surgeon: Woodroe Mode, MD;  Location: [place];  Service: Obstetrics;  Laterality: N/A;   CESAREAN SECTION N/A 11/06/2021   Procedure: CESAREAN  SECTION;  Surgeon: Donnamae Jude, MD;  Location: [facility];  Service: Obstetrics;  Laterality: N/A;   CHOLECYSTECTOMY     DIAGNOSTIC LAPAROSCOPY  11/2020   LOA, right salpingectoy      OB History     Gravida  2   Para  2   Term  1   Preterm  1   AB  0   Living  2      SAB  0   IAB  0   Ectopic  0   Multiple  0   Live Births  2               Current Medications: Current Meds  Medication Sig   famotidine (PEPCID) 20 MG tablet TAKE 1 TABLET BY MOUTH 2 TIMES DAILY   labetalol (NORMODYNE) 200 MG tablet Take 1 tablet (200 mg total) by mouth at bedtime.   [DISCONTINUED] labetalol (NORMODYNE) 200 MG tablet Take 1 tablet by mouth 2 times daily.     Allergies:   Patient has no known allergies.   Social History   Socioeconomic History   Marital status: Married    Spouse name: Not on file   Number of children: Not on file   Years of education: Not on file   Highest education level: Not on file  Occupational History   Not on file  Tobacco Use   Smoking status: Never   Smokeless tobacco: Never  Vaping Use   Vaping Use: Never used  Substance and Sexual Activity   Alcohol use: Not Currently    Alcohol/week: 0.0 standard drinks of alcohol    Comment: rarely   Drug use: No   Sexual activity: Yes    Birth control/protection: None  Other Topics Concern   Not on file  Social History Narrative   Not on file   Social Determinants of Health   Financial Resource Strain: Low Risk  (06/08/2021)   Overall Financial Resource Strain (CARDIA)    Difficulty of Paying Living Expenses: Not hard at all  Food Insecurity: No Food Insecurity (10/29/2021)   Hunger Vital Sign    Worried About Running Out of Food in the Last Year: Never true    Ran Out of Food in the Last Year: Never true  Transportation Needs: No Transportation Needs (10/29/2021)   PRAPARE - Hydrologist (Medical): No    Lack of Transportation (Non-Medical): No  Physical  Activity: Not on file  Stress: Not on file  Social Connections: Not on file      Family History  Problem Relation Age of Onset   Hypertension Mother    Hypertension Father    Diabetes Father    Cancer Father       ROS:   Please see the history of present illness.     All other systems reviewed and are negative.   Labs/EKG Reviewed:    EKG:   EKG is was not ordered today.    Recent Labs: 05/14/2021: TSH 0.609 11/04/2021: ALT 22; BUN 5; Creatinine, Ser 0.78; Potassium 3.8; Sodium 134 11/07/2021: Hemoglobin 11.6; Platelets 247   Recent Lipid Panel Lab Results  Component Value Date/Time   CHOL 202 (H) 10/29/2020 10:49 AM   TRIG 73.0 10/29/2020 10:49 AM   HDL 63.60 10/29/2020 10:49 AM   CHOLHDL 3 10/29/2020 10:49 AM   LDLCALC 123 (H) 10/29/2020 10:49 AM    Physical Exam:    VS:  BP 114/78   Pulse 92   Ht '5\' 5"'$  (1.651 m)   Wt 169 lb 6.4 oz (76.8 kg)   SpO2 99%   BMI 28.19 kg/m     Wt Readings from Last 3 Encounters:  03/05/22 169 lb 6.4 oz (76.8 kg)  12/07/21 157 lb 14.4 oz (71.6 kg)  11/27/21 157 lb (71.2 kg)     GEN:  Well nourished, well developed in no acute distress HEENT: Normal NECK: No JVD; No carotid bruits LYMPHATICS: No lymphadenopathy CARDIAC: RRR, no murmurs, rubs, gallops RESPIRATORY:  Clear to auscultation without rales, wheezing or rhonchi  ABDOMEN: Soft, non-tender, non-distended MUSCULOSKELETAL:  No edema; No deformity  SKIN: Warm and dry NEUROLOGIC:  Alert and oriented x 3 PSYCHIATRIC:  Normal affect    Risk Assessment/Risk Calculators:                 ASSESSMENT & PLAN:    Hypertension - we are titrating off her antihypertensive medications.  We will continue to monitor and hopefully by the next visit we can titrate this off completely. She does have hyperlipidemia but would like to wait until post breast-feeding repeat her lipid profile and then hopefully start her on a statin if needed.  For now she will use diet  modification.  Patient Instructions  Medication Instructions:  Your physician has recommended you make the following change in your medication:  CHANGE: Labetalol 200 mg nightly *If you need a refill on your cardiac medications  before your next appointment, please call your pharmacy*   Lab Work: None If you have labs (blood work) drawn today and your tests are completely normal, you will receive your results only by: [place] (if you have MyChart) OR A paper copy in the mail If you have any lab test that is abnormal or we need to change your treatment, we will call you to review the results.   Testing/Procedures: None   Follow-Up: At [facility], you and your health needs are our priority.  As part of our continuing mission to provide you with exceptional heart care, we have created designated Provider Care Teams.  These Care Teams include your primary Cardiologist (physician) and Advanced Practice Providers (APPs -  Physician Assistants and Nurse Practitioners) who all work together to provide you with the care you need, when you need it.  We recommend signing up for the patient portal called "MyChart".  Sign up information is provided on this After Visit Summary.  MyChart is used to connect with patients for Virtual Visits (Telemedicine).  Patients are able to view lab/test results, encounter notes, upcoming appointments, etc.  Non-urgent messages can be sent to your provider as well.   To learn more about what you can do with MyChart, go to [email or web address].    Your next appointment:   9 month(s)  The format for your next appointment:   Virtual Visit   Provider:   Berniece Salines, DO     Other Instructions   Important Information About Sugar         Dispo:  Return in about 9 months (around 12/05/2022).   Medication Adjustments/Labs and Tests Ordered: Current medicines are reviewed at length with the patient today.  Concerns regarding medicines are  outlined above.  Tests Ordered: No orders of the defined types were placed in this encounter.  Medication Changes: Meds ordered this encounter  Medications   labetalol (NORMODYNE) 200 MG tablet    Sig: Take 1 tablet (200 mg total) by mouth at bedtime.    Dispense:  90 tablet    Refill:  3

## 2022-03-05 NOTE — Patient Instructions (Signed)
Medication Instructions:  Your physician has recommended you make the following change in your medication:  CHANGE: Labetalol 200 mg nightly *If you need a refill on your cardiac medications before your next appointment, please call your pharmacy*   Lab Work: None If you have labs (blood work) drawn today and your tests are completely normal, you will receive your results only by: [place] (if you have MyChart) OR A paper copy in the mail If you have any lab test that is abnormal or we need to change your treatment, we will call you to review the results.   Testing/Procedures: None   Follow-Up: At [facility], you and your health needs are our priority.  As part of our continuing mission to provide you with exceptional heart care, we have created designated Provider Care Teams.  These Care Teams include your primary Cardiologist (physician) and Advanced Practice Providers (APPs -  Physician Assistants and Nurse Practitioners) who all work together to provide you with the care you need, when you need it.  We recommend signing up for the patient portal called "MyChart".  Sign up information is provided on this After Visit Summary.  MyChart is used to connect with patients for Virtual Visits (Telemedicine).  Patients are able to view lab/test results, encounter notes, upcoming appointments, etc.  Non-urgent messages can be sent to your provider as well.   To learn more about what you can do with MyChart, go to [email or web address].    Your next appointment:   9 month(s)  The format for your next appointment:   Virtual Visit   Provider:   Berniece Salines, DO     Other Instructions   Important Information About Sugar

## 2022-03-19 ENCOUNTER — Other Ambulatory Visit (HOSPITAL_COMMUNITY): Payer: Self-pay

## 2022-04-13 ENCOUNTER — Other Ambulatory Visit (HOSPITAL_COMMUNITY): Payer: Self-pay

## 2022-04-20 ENCOUNTER — Other Ambulatory Visit (HOSPITAL_COMMUNITY): Payer: Self-pay

## 2022-04-20 ENCOUNTER — Other Ambulatory Visit: Payer: Self-pay | Admitting: Family Medicine

## 2022-04-20 DIAGNOSIS — K219 Gastro-esophageal reflux disease without esophagitis: Secondary | ICD-10-CM

## 2022-04-21 ENCOUNTER — Other Ambulatory Visit (HOSPITAL_COMMUNITY): Payer: Self-pay

## 2022-04-21 MED ORDER — FAMOTIDINE 20 MG PO TABS
ORAL_TABLET | Freq: Two times a day (BID) | ORAL | 0 refills | Status: DC
  Filled 2022-04-21: qty 180, 90d supply, fill #0

## 2022-04-23 ENCOUNTER — Other Ambulatory Visit (HOSPITAL_COMMUNITY): Payer: Self-pay

## 2022-04-23 ENCOUNTER — Other Ambulatory Visit: Payer: Self-pay | Admitting: Obstetrics & Gynecology

## 2022-04-23 DIAGNOSIS — J069 Acute upper respiratory infection, unspecified: Secondary | ICD-10-CM

## 2022-04-23 MED ORDER — AZITHROMYCIN 250 MG PO TABS
ORAL_TABLET | ORAL | 1 refills | Status: DC
  Filled 2022-04-23: qty 6, 5d supply, fill #0

## 2022-04-23 MED ORDER — TERCONAZOLE 80 MG VA SUPP
80.0000 mg | Freq: Every day | VAGINAL | 2 refills | Status: AC
  Filled 2022-04-23: qty 3, 3d supply, fill #0
  Filled 2022-06-02: qty 3, 3d supply, fill #1
  Filled 2022-07-27 – 2023-02-24 (×2): qty 3, 3d supply, fill #2

## 2022-04-23 NOTE — Progress Notes (Signed)
Patient desired Azithromycin pack for persistent, non-COVID URI symptoms.  This was prescribed. She will gop for evaluation if symptoms worsen.   [name], MD

## 2022-04-23 NOTE — Progress Notes (Signed)
Patient also wanted Terconazole in addition to her antibiotics given history of rebound yeast infections. This was prescribed.   [name], MD

## 2022-04-24 ENCOUNTER — Other Ambulatory Visit (HOSPITAL_COMMUNITY): Payer: Self-pay

## 2022-04-26 ENCOUNTER — Other Ambulatory Visit (HOSPITAL_COMMUNITY): Payer: Self-pay

## 2022-05-10 ENCOUNTER — Other Ambulatory Visit (HOSPITAL_COMMUNITY): Payer: Self-pay

## 2022-05-10 ENCOUNTER — Ambulatory Visit (INDEPENDENT_AMBULATORY_CARE_PROVIDER_SITE_OTHER): Payer: 59 | Admitting: Family Medicine

## 2022-05-10 VITALS — BP 118/78 | HR 89 | Temp 98.2°F | Wt 176.2 lb

## 2022-05-10 DIAGNOSIS — R0981 Nasal congestion: Secondary | ICD-10-CM | POA: Diagnosis not present

## 2022-05-10 DIAGNOSIS — J069 Acute upper respiratory infection, unspecified: Secondary | ICD-10-CM

## 2022-05-10 DIAGNOSIS — R051 Acute cough: Secondary | ICD-10-CM | POA: Diagnosis not present

## 2022-05-10 MED ORDER — AMOXICILLIN-POT CLAVULANATE 500-125 MG PO TABS
1.0000 | ORAL_TABLET | Freq: Two times a day (BID) | ORAL | 0 refills | Status: AC
  Filled 2022-05-10: qty 14, 7d supply, fill #0

## 2022-05-10 NOTE — Progress Notes (Signed)
Subjective:    Patient ID: Melissa Ruiz, female    DOB: [birth year], 36 y.o.   MRN: [id number]  Chief Complaint  Patient presents with   URI    Has had 3 in the last 2 months, no fevers, just nasal pressure, voice goes.  Is using generic flonase since fri, seems to be helping.    HPI Patient was seen today for ongoing concern.  Patient endorses 3 episodes of URI symptoms August until now.  Patient notes everyone in the house was sick during be first episode from her child who is in daycare.  Had a z-pack for one of the episodes.  Pt currently endorses productive cough, congestion, sore throat, and sinus pressure x several days.  Denies fever.  Used nasal spray.  Past Medical History:  Diagnosis Date   Anemia    DVT (deep venous thrombosis) ([place]) 2017   Endometriosis 01/08/2021   Essential hypertension 09/16/2014   History of placenta previa 05/14/2021   Not seen on 2022 pregnancy   Hyperlipidemia    Pregnancy resulting from in vitro fertilization, antepartum 04/12/2018   Patient of Dr. Kerin Perna.  EDD 11/10/2018 by IVF dating.  '[x]'$  fetal echo: normal except known PACs   Pulmonary embolism ([place]) 09/2015   Vitamin D deficiency 08/12/2017    No Known Allergies  ROS General: Denies fever, chills, night sweats, changes in weight, changes in appetite HEENT: Denies headaches, ear pain, changes in vision, rhinorrhea, sore throat  +sore throat, sinus pressure, congestion CV: Denies CP, palpitations, SOB, orthopnea Pulm: Denies SOB, cough, wheezing  +productive cough GI: Denies abdominal pain, nausea, vomiting, diarrhea, constipation GU: Denies dysuria, hematuria, frequency, vaginal discharge Msk: Denies muscle cramps, joint pains Neuro: Denies weakness, numbness, tingling Skin: Denies rashes, bruising Psych: Denies depression, anxiety, hallucinations      Objective:    Blood pressure 118/78, pulse 89, temperature 98.2 F (36.8 C), temperature source Oral, weight 176 lb  3.2 oz (79.9 kg), SpO2 98 %, currently breastfeeding.   Gen. Pleasant, well-nourished, in no distress, normal affect   HEENT: Amanda/AT, face symmetric, conjunctiva clear, no scleral icterus, PERRLA, EOMI, nares patent without drainage, pharynx without erythema or exudate.  TMs full b/l. Lungs: cough, no accessory muscle use, CTAB, no wheezes or rales Cardiovascular: RRR, no m/r/g, no peripheral edema Neuro:  A&Ox3, CN II-XII intact, normal gait Skin:  Warm, no lesions/ rash   Wt Readings from Last 3 Encounters:  05/10/22 176 lb 3.2 oz (79.9 kg)  03/05/22 169 lb 6.4 oz (76.8 kg)  12/07/21 157 lb 14.4 oz (71.6 kg)    Lab Results  Component Value Date   WBC 15.1 (H) 11/07/2021   HGB 11.6 (L) 11/07/2021   HCT 34.7 (L) 11/07/2021   PLT 247 11/07/2021   GLUCOSE 137 (H) 11/04/2021   CHOL 202 (H) 10/29/2020   TRIG 73.0 10/29/2020   HDL 63.60 10/29/2020   LDLCALC 123 (H) 10/29/2020   ALT 22 11/04/2021   AST 28 11/04/2021   NA 134 (L) 11/04/2021   K 3.8 11/04/2021   CL 105 11/04/2021   CREATININE 0.78 11/04/2021   BUN 5 (L) 11/04/2021   CO2 21 (L) 11/04/2021   TSH 0.609 05/14/2021   HGBA1C 5.7 (H) 09/16/2021    Assessment/Plan:  Upper respiratory tract infection, unspecified type -given mild improvement in symptoms given wait and see rx for augmentin -continue supportive care with OTC meds -given precautions  - Plan: amoxicillin-clavulanate (AUGMENTIN) 500-125 MG tablet  Nasal congestion  Acute  cough  F/u prn for continued or worsened symptoms  Grier Mitts, MD

## 2022-05-17 DIAGNOSIS — Z760 Encounter for issue of repeat prescription: Secondary | ICD-10-CM | POA: Diagnosis not present

## 2022-05-19 ENCOUNTER — Other Ambulatory Visit (HOSPITAL_COMMUNITY): Payer: Self-pay

## 2022-05-29 ENCOUNTER — Encounter: Payer: Self-pay | Admitting: Family Medicine

## 2022-06-01 DIAGNOSIS — F458 Other somatoform disorders: Secondary | ICD-10-CM | POA: Diagnosis not present

## 2022-06-01 DIAGNOSIS — G4710 Hypersomnia, unspecified: Secondary | ICD-10-CM | POA: Diagnosis not present

## 2022-06-01 DIAGNOSIS — R0683 Snoring: Secondary | ICD-10-CM | POA: Diagnosis not present

## 2022-06-01 DIAGNOSIS — E669 Obesity, unspecified: Secondary | ICD-10-CM | POA: Diagnosis not present

## 2022-06-01 DIAGNOSIS — J3089 Other allergic rhinitis: Secondary | ICD-10-CM | POA: Diagnosis not present

## 2022-06-01 DIAGNOSIS — I10 Essential (primary) hypertension: Secondary | ICD-10-CM | POA: Diagnosis not present

## 2022-06-02 ENCOUNTER — Other Ambulatory Visit (HOSPITAL_COMMUNITY): Payer: Self-pay

## 2022-06-02 MED ORDER — HYDROCODONE-ACETAMINOPHEN 5-325 MG PO TABS
1.0000 | ORAL_TABLET | Freq: Four times a day (QID) | ORAL | 0 refills | Status: DC | PRN
  Filled 2022-06-02: qty 12, 3d supply, fill #0

## 2022-06-02 MED ORDER — AMOXICILLIN 500 MG PO CAPS
500.0000 mg | ORAL_CAPSULE | Freq: Three times a day (TID) | ORAL | 0 refills | Status: DC
  Filled 2022-06-02: qty 30, 10d supply, fill #0

## 2022-06-03 ENCOUNTER — Other Ambulatory Visit (HOSPITAL_COMMUNITY): Payer: Self-pay

## 2022-06-04 ENCOUNTER — Other Ambulatory Visit (HOSPITAL_COMMUNITY): Payer: Self-pay

## 2022-06-09 DIAGNOSIS — G4733 Obstructive sleep apnea (adult) (pediatric): Secondary | ICD-10-CM | POA: Diagnosis not present

## 2022-06-17 DIAGNOSIS — G4710 Hypersomnia, unspecified: Secondary | ICD-10-CM | POA: Diagnosis not present

## 2022-06-17 DIAGNOSIS — G4733 Obstructive sleep apnea (adult) (pediatric): Secondary | ICD-10-CM | POA: Diagnosis not present

## 2022-07-05 ENCOUNTER — Encounter: Payer: Self-pay | Admitting: Family Medicine

## 2022-07-12 ENCOUNTER — Other Ambulatory Visit: Payer: Self-pay | Admitting: Family Medicine

## 2022-07-12 DIAGNOSIS — R0683 Snoring: Secondary | ICD-10-CM

## 2022-07-13 ENCOUNTER — Other Ambulatory Visit (HOSPITAL_COMMUNITY): Payer: Self-pay

## 2022-07-21 NOTE — Progress Notes (Signed)
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 Mild sleep apnea noted on home study through pt's dentist.  Medical problem list includes  HTN, hxPE/DVT, GERD, Goiter, Endometriosis, Hyperlipidemia,  Epworth score- Body weight today-168 lbs Covid vax- Flu vax- Aware of snoring. Had NPSG in [place] 115-20 years ago. No ENT surgery. No CPAP. Her recent home study showed AHI 4-5/ hr and was scored by the interpreter as indicating mild OSA.  We discussed standard scoring criteria and treatment options including conservative measures such as weight management and sleep off back. We discussed CPAP, oral appliances and ENT interventions.   Prior to Admission medications   Medication Sig Start Date End Date Taking? Authorizing Provider  famotidine (PEPCID) 20 MG tablet TAKE 1 TABLET BY MOUTH 2 TIMES DAILY 04/21/22 04/21/23 Yes Billie Ruddy, MD  labetalol (NORMODYNE) 200 MG tablet Take 1 tablet (200 mg total) by mouth at bedtime. 03/05/22  Yes Tobb, Kardie, DO  amoxicillin (AMOXIL) 500 MG capsule Take 1 capsule (500 mg total) by mouth 3 (three) times daily. 06/02/22     azithromycin (ZITHROMAX) 250 MG tablet Take as directed: Two pills by mouth the first day, then one pill every day until completed 04/23/22   Anyanwu, Sallyanne Havers, MD  HYDROcodone-acetaminophen (NORCO/VICODIN) 5-325 MG tablet Take 1 tablet by mouth every 6 (six) hours as needed for breakthrough pain 06/02/22     terconazole (TERAZOL 3) 80 MG vaginal suppository Place 1 suppository (80 mg total) vaginally at bedtime. Patient taking differently: Place 80 mg vaginally as needed. 04/23/22   Osborne Oman, MD   Past Medical History:  Diagnosis Date   Anemia    DVT (deep venous thrombosis) ([place]) 2017   Endometriosis 01/08/2021   Essential hypertension 09/16/2014   History of placenta previa 05/14/2021   Not seen on 2022 pregnancy   Hyperlipidemia    Pregnancy resulting  from in vitro fertilization, antepartum 04/12/2018   Patient of Dr. Kerin Perna.  EDD 11/10/2018 by IVF dating.  '[x]'$  fetal echo: normal except known PACs   Pulmonary embolism ([place]) 09/2015   Vitamin D deficiency 08/12/2017   Past Surgical History:  Procedure Laterality Date   CESAREAN SECTION N/A 08/27/2018   Procedure: CESAREAN SECTION;  Surgeon: Woodroe Mode, MD;  Location: [facility];  Service: Obstetrics;  Laterality: N/A;   CESAREAN SECTION N/A 11/06/2021   Procedure: CESAREAN SECTION;  Surgeon: Donnamae Jude, MD;  Location: [facility];  Service: Obstetrics;  Laterality: N/A;   CHOLECYSTECTOMY     DIAGNOSTIC LAPAROSCOPY  11/2020   LOA, right salpingectoy   Family History  Problem Relation Age of Onset   Hypertension Mother    Hypertension Father    Diabetes Father    Cancer Father    Social History   Socioeconomic History   Marital status: Married    Spouse name: Not on file   Number of children: Not on file   Years of education: Not on file   Highest education level: Not on file  Occupational History   Not on file  Tobacco Use   Smoking status: Never   Smokeless tobacco: Never  Vaping Use   Vaping Use: Never used  Substance and Sexual Activity   Alcohol use: Not Currently    Alcohol/week: 0.0 standard drinks of alcohol    Comment: rarely   Drug use: No   Sexual activity: Yes    Birth control/protection: None  Other Topics Concern   Not on file  Social History Narrative   Not on file   Social Determinants of Health   Financial Resource Strain: Low Risk  (06/08/2021)   Overall Financial Resource Strain (CARDIA)    Difficulty of Paying Living Expenses: Not hard at all  Food Insecurity: No Food Insecurity (10/29/2021)   Hunger Vital Sign    Worried About Running Out of Food in the Last Year: Never true    Ran Out of Food in the Last Year: Never true  Transportation Needs: No Transportation Needs (10/29/2021)   PRAPARE - Radiographer, therapeutic (Medical): No    Lack of Transportation (Non-Medical): No  Physical Activity: Not on file  Stress: Not on file  Social Connections: Not on file  Intimate Partner Violence: Not on file   ROS-see HPI  + = positive Constitutional:    weight loss, night sweats, fevers, chills, fatigue, lassitude. HEENT:    headaches, difficulty swallowing, tooth/dental problems, sore throat,       sneezing, itching, ear ache, nasal congestion, post nasal drip, snoring CV:    chest pain, orthopnea, PND, swelling in lower extremities, anasarca,                                  dizziness, palpitations Resp:   shortness of breath with exertion or at rest.                productive cough,   non-productive cough, coughing up of blood.              change in color of mucus.  wheezing.   Skin:    rash or lesions. GI:  +heartburn, +indigestion, abdominal pain, nausea, vomiting, diarrhea,                 change in bowel habits, loss of appetite GU: dysuria, change in color of urine, no urgency or frequency.   flank pain. MS:   joint pain, stiffness, decreased range of motion, back pain. Neuro-     nothing unusual Psych:  change in mood or affect.  depression or anxiety.   memory loss.  OBJ- Physical Exam General- Alert, Oriented, Affect-appropriate, Distress- none acute Skin- rash-none, lesions- none, excoriation- none Lymphadenopathy- none Head- atraumatic            Eyes- Gross vision intact, PERRLA, conjunctivae and secretions clear            Ears- Hearing, canals-normal            Nose- Clear, no-Septal dev, mucus, polyps, erosion, perforation             Throat- Mallampati III-IV , mucosa clear , drainage- none, tonsils- atrophic, +teeth Neck- flexible , trachea midline, no stridor , thyroid nl, carotid no bruit Chest - symmetrical excursion , unlabored           Heart/CV- RRR , no murmur , no gallop  , no rub, nl s1 s2                           - JVD- none , edema- none, stasis changes-  none, varices- none           Lung- clear to P&A, wheeze- none, cough- none , dullness-none, rub- none           Chest wall-  Abd-  Br/ Gen/ Rectal- Not done, not indicated Extrem- cyanosis- none, clubbing, none, atrophy- none, strength- nl Neuro- grossly intact to observation

## 2022-07-22 ENCOUNTER — Encounter: Payer: Self-pay | Admitting: Internal Medicine

## 2022-07-22 ENCOUNTER — Ambulatory Visit (INDEPENDENT_AMBULATORY_CARE_PROVIDER_SITE_OTHER): Payer: 59 | Admitting: Internal Medicine

## 2022-07-22 VITALS — BP 112/72 | HR 87 | Ht 65.0 in | Wt 168.0 lb

## 2022-07-22 DIAGNOSIS — R0683 Snoring: Secondary | ICD-10-CM | POA: Diagnosis not present

## 2022-07-22 NOTE — Patient Instructions (Signed)
Good luck with your decision. I'll be happy to try to help if you have more questions later.

## 2022-07-27 ENCOUNTER — Other Ambulatory Visit: Payer: Self-pay | Admitting: Family Medicine

## 2022-07-27 ENCOUNTER — Encounter: Payer: Self-pay | Admitting: Internal Medicine

## 2022-07-27 DIAGNOSIS — K219 Gastro-esophageal reflux disease without esophagitis: Secondary | ICD-10-CM

## 2022-07-27 DIAGNOSIS — R0683 Snoring: Secondary | ICD-10-CM | POA: Insufficient documentation

## 2022-07-27 NOTE — Assessment & Plan Note (Signed)
Primary focus of intervention would bee snoring. Discussed option to update sleep study and to explore treatment options for sleep disordered breathing in more detail.Marland Kitchen She will decide if she wants any intervention at this time

## 2022-07-28 ENCOUNTER — Other Ambulatory Visit: Payer: Self-pay

## 2022-07-28 ENCOUNTER — Other Ambulatory Visit (HOSPITAL_COMMUNITY): Payer: Self-pay

## 2022-07-28 MED ORDER — FAMOTIDINE 20 MG PO TABS
20.0000 mg | ORAL_TABLET | Freq: Two times a day (BID) | ORAL | 0 refills | Status: DC
  Filled 2022-07-28: qty 180, 90d supply, fill #0

## 2022-07-29 ENCOUNTER — Other Ambulatory Visit: Payer: Self-pay

## 2022-07-29 ENCOUNTER — Other Ambulatory Visit (HOSPITAL_COMMUNITY): Payer: Self-pay

## 2022-07-29 ENCOUNTER — Other Ambulatory Visit: Payer: Self-pay | Admitting: Obstetrics & Gynecology

## 2022-07-29 DIAGNOSIS — B3731 Acute candidiasis of vulva and vagina: Secondary | ICD-10-CM

## 2022-07-30 ENCOUNTER — Other Ambulatory Visit (HOSPITAL_COMMUNITY): Payer: Self-pay

## 2022-07-30 MED ORDER — FLUCONAZOLE 150 MG PO TABS
150.0000 mg | ORAL_TABLET | ORAL | 3 refills | Status: AC
  Filled 2022-07-30 – 2022-08-05 (×2): qty 3, 9d supply, fill #0
  Filled 2022-10-18: qty 3, 9d supply, fill #1

## 2022-08-04 ENCOUNTER — Other Ambulatory Visit (HOSPITAL_COMMUNITY): Payer: Self-pay

## 2022-08-05 ENCOUNTER — Other Ambulatory Visit (HOSPITAL_COMMUNITY): Payer: Self-pay

## 2022-08-17 ENCOUNTER — Ambulatory Visit: Payer: Commercial Managed Care - PPO | Admitting: Family Medicine

## 2022-10-18 ENCOUNTER — Other Ambulatory Visit (HOSPITAL_COMMUNITY): Payer: Self-pay

## 2022-10-18 ENCOUNTER — Other Ambulatory Visit: Payer: Self-pay | Admitting: Family Medicine

## 2022-10-18 DIAGNOSIS — K219 Gastro-esophageal reflux disease without esophagitis: Secondary | ICD-10-CM

## 2022-10-19 ENCOUNTER — Other Ambulatory Visit (HOSPITAL_COMMUNITY): Payer: Self-pay

## 2022-10-19 MED ORDER — FAMOTIDINE 20 MG PO TABS
20.0000 mg | ORAL_TABLET | Freq: Two times a day (BID) | ORAL | 0 refills | Status: DC
  Filled 2022-10-19 – 2022-10-21 (×2): qty 180, 90d supply, fill #0

## 2022-10-21 ENCOUNTER — Other Ambulatory Visit: Payer: Self-pay

## 2022-10-21 ENCOUNTER — Other Ambulatory Visit (HOSPITAL_COMMUNITY): Payer: Self-pay

## 2022-12-10 ENCOUNTER — Encounter: Payer: Self-pay | Admitting: Family Medicine

## 2022-12-10 ENCOUNTER — Ambulatory Visit (INDEPENDENT_AMBULATORY_CARE_PROVIDER_SITE_OTHER): Payer: 59 | Admitting: Family Medicine

## 2022-12-10 VITALS — BP 124/72 | HR 84 | Temp 98.9°F | Ht 64.96 in | Wt 157.2 lb

## 2022-12-10 DIAGNOSIS — Z0000 Encounter for general adult medical examination without abnormal findings: Secondary | ICD-10-CM | POA: Diagnosis not present

## 2022-12-10 DIAGNOSIS — Z131 Encounter for screening for diabetes mellitus: Secondary | ICD-10-CM | POA: Diagnosis not present

## 2022-12-10 DIAGNOSIS — E559 Vitamin D deficiency, unspecified: Secondary | ICD-10-CM | POA: Diagnosis not present

## 2022-12-10 DIAGNOSIS — Z862 Personal history of diseases of the blood and blood-forming organs and certain disorders involving the immune mechanism: Secondary | ICD-10-CM | POA: Diagnosis not present

## 2022-12-10 DIAGNOSIS — Z1159 Encounter for screening for other viral diseases: Secondary | ICD-10-CM | POA: Diagnosis not present

## 2022-12-10 DIAGNOSIS — K219 Gastro-esophageal reflux disease without esophagitis: Secondary | ICD-10-CM

## 2022-12-10 DIAGNOSIS — E7841 Elevated Lipoprotein(a): Secondary | ICD-10-CM

## 2022-12-10 DIAGNOSIS — E049 Nontoxic goiter, unspecified: Secondary | ICD-10-CM

## 2022-12-10 DIAGNOSIS — I10 Essential (primary) hypertension: Secondary | ICD-10-CM | POA: Diagnosis not present

## 2022-12-10 LAB — CBC WITH DIFFERENTIAL/PLATELET
Basophils Absolute: 0 10*3/uL (ref 0.0–0.1)
Basophils Relative: 0.6 % (ref 0.0–3.0)
Eosinophils Absolute: 0.3 10*3/uL (ref 0.0–0.7)
Eosinophils Relative: 6.8 % — ABNORMAL HIGH (ref 0.0–5.0)
HCT: 35.3 % — ABNORMAL LOW (ref 36.0–46.0)
Hemoglobin: 12 g/dL (ref 12.0–15.0)
Lymphocytes Relative: 40.5 % (ref 12.0–46.0)
Lymphs Abs: 1.9 10*3/uL (ref 0.7–4.0)
MCHC: 34 g/dL (ref 30.0–36.0)
MCV: 90.5 fl (ref 78.0–100.0)
Monocytes Absolute: 0.3 10*3/uL (ref 0.1–1.0)
Monocytes Relative: 7.2 % (ref 3.0–12.0)
Neutro Abs: 2.2 10*3/uL (ref 1.4–7.7)
Neutrophils Relative %: 44.9 % (ref 43.0–77.0)
Platelets: 314 10*3/uL (ref 150.0–400.0)
RBC: 3.9 Mil/uL (ref 3.87–5.11)
RDW: 13.7 % (ref 11.5–15.5)
WBC: 4.8 10*3/uL (ref 4.0–10.5)

## 2022-12-10 LAB — COMPREHENSIVE METABOLIC PANEL
ALT: 15 U/L (ref 0–35)
AST: 19 U/L (ref 0–37)
Albumin: 4.4 g/dL (ref 3.5–5.2)
Alkaline Phosphatase: 76 U/L (ref 39–117)
BUN: 11 mg/dL (ref 6–23)
CO2: 27 mEq/L (ref 19–32)
Calcium: 9.5 mg/dL (ref 8.4–10.5)
Chloride: 105 mEq/L (ref 96–112)
Creatinine, Ser: 1.03 mg/dL (ref 0.40–1.20)
GFR: 68.75 mL/min (ref >60.00)
Glucose, Bld: 90 mg/dL (ref 70–99)
Potassium: 4.4 mEq/L (ref 3.5–5.1)
Sodium: 139 mEq/L (ref 135–145)
Total Bilirubin: 0.7 mg/dL (ref 0.2–1.2)
Total Protein: 7.5 g/dL (ref 6.0–8.3)

## 2022-12-10 LAB — LIPID PANEL
Cholesterol: 201 mg/dL — ABNORMAL HIGH (ref 0–200)
HDL: 47.4 mg/dL (ref >39.00)
LDL Cholesterol: 142 mg/dL — ABNORMAL HIGH (ref 0–99)
NonHDL: 153.26
Total CHOL/HDL Ratio: 4
Triglycerides: 58 mg/dL (ref 0.0–149.0)
VLDL: 11.6 mg/dL (ref 0.0–40.0)

## 2022-12-10 LAB — VITAMIN D 25 HYDROXY (VIT D DEFICIENCY, FRACTURES): VITD: 27.32 ng/mL — ABNORMAL LOW (ref 30.00–100.00)

## 2022-12-10 LAB — TSH: TSH: 1.51 u[IU]/mL (ref 0.35–5.50)

## 2022-12-10 LAB — VITAMIN B12: Vitamin B-12: 521 pg/mL (ref 211–911)

## 2022-12-10 LAB — HEMOGLOBIN A1C: Hgb A1c MFr Bld: 5.6 % (ref 4.6–6.5)

## 2022-12-10 LAB — T4, FREE: Free T4: 0.88 ng/dL (ref 0.60–1.60)

## 2022-12-10 NOTE — Progress Notes (Signed)
Established Patient Office Visit   Subjective  Patient ID: Melissa Ruiz, female    DOB: 12-Oct-1985  Age: 37 y.o. MRN: [id number]  No chief complaint on file.   Patient is a 37 year old female seen for CPE.  Patient states she has been doing well overall.  Staying busy working and taking care of her children.  Interested in screening labs for hep C, HIV, etc. due to frequently getting her nails and eyebrows done.  GERD stable on Pepcid.  History of vitamin D deficiency and anemia.  Taking OTC gummy iron supplement and MVI.  Interested in cholesterol levels as LDL has been elevated in the past.  Seen by cardiology.  Has appointment months.  Taking labetalol for BP.  Followed by OB/GYN, last Pap 09/12/2020.    Past Medical History:  Diagnosis Date   Anemia    DVT (deep venous thrombosis) ([facility]) 2017   Endometriosis 01/08/2021   Essential hypertension 09/16/2014   History of placenta previa 05/14/2021   Not seen on 2022 pregnancy   Hyperlipidemia    Pregnancy resulting from in vitro fertilization, antepartum 04/12/2018   Patient of Dr. April Manson.  EDD 11/10/2018 by IVF dating.  [x]  fetal echo: normal except known PACs   Pulmonary embolism ([facility]) 09/2015   Vitamin D deficiency 08/12/2017   Past Surgical History:  Procedure Laterality Date   CESAREAN SECTION N/A 08/27/2018   Procedure: CESAREAN SECTION;  Surgeon: Adam Phenix, MD;  Location: [facility];  Service: Obstetrics;  Laterality: N/A;   CESAREAN SECTION N/A 11/06/2021   Procedure: CESAREAN SECTION;  Surgeon: Reva Bores, MD;  Location: [facility];  Service: Obstetrics;  Laterality: N/A;   CHOLECYSTECTOMY     DIAGNOSTIC LAPAROSCOPY  11/2020   LOA, right salpingectoy   Social History   Tobacco Use   Smoking status: Never   Smokeless tobacco: Never  Vaping Use   Vaping Use: Never used  Substance Use Topics   Alcohol use: Not Currently    Alcohol/week: 0.0 standard drinks of alcohol    Comment:  rarely   Drug use: No   Family History  Problem Relation Age of Onset   Hypertension Mother    Hypertension Father    Diabetes Father    Cancer Father    No Known Allergies    ROS Negative unless stated above    Objective:     BP 124/72 (BP Location: Left Arm, Patient Position: Sitting, Cuff Size: Normal)   Pulse 84   Temp 98.9 F (37.2 C) (Oral)   Ht 5' 4.96" (1.65 m)   Wt 157 lb 3.2 oz (71.3 kg)   SpO2 99%   BMI 26.19 kg/m  BP Readings from Last 3 Encounters:  12/10/22 124/72  07/22/22 112/72  05/10/22 118/78   Wt Readings from Last 3 Encounters:  12/10/22 157 lb 3.2 oz (71.3 kg)  07/22/22 168 lb (76.2 kg)  05/10/22 176 lb 3.2 oz (79.9 kg)      Physical Exam Constitutional:      Appearance: Normal appearance.  HENT:     Head: Normocephalic and atraumatic.     Right Ear: Tympanic membrane, ear canal and external ear normal.     Left Ear: Tympanic membrane, ear canal and external ear normal.     Nose: Nose normal.     Mouth/Throat:     Mouth: Mucous membranes are moist.     Pharynx: No oropharyngeal exudate or posterior oropharyngeal erythema.  Eyes:  General: No scleral icterus.    Extraocular Movements: Extraocular movements intact.     Conjunctiva/sclera: Conjunctivae normal.     Pupils: Pupils are equal, round, and reactive to light.  Neck:     Comments: Mild thyromegaly Cardiovascular:     Rate and Rhythm: Normal rate and regular rhythm.     Pulses: Normal pulses.     Heart sounds: Normal heart sounds. No murmur heard.    No friction rub.  Pulmonary:     Effort: Pulmonary effort is normal.     Breath sounds: Normal breath sounds. No wheezing, rhonchi or rales.  Abdominal:     General: Bowel sounds are normal.     Palpations: Abdomen is soft.     Tenderness: There is no abdominal tenderness.  Musculoskeletal:        General: No deformity. Normal range of motion.  Lymphadenopathy:     Cervical: No cervical adenopathy.  Skin:     General: Skin is warm and dry.     Findings: No lesion.  Neurological:     General: No focal deficit present.     Mental Status: She is alert and oriented to person, place, and time.  Psychiatric:        Mood and Affect: Mood normal.        Thought Content: Thought content normal.      No results found for any visits on 12/10/22.    Assessment & Plan:  Well adult exam -Age-appropriate health screenings discussed -Immunizations reviewed -Mammogram not yet indicated 2/2 age -Pap up-to-date, done by OB/GYN 09/12/2020. -     Hemoglobin A1c -     HIV Antibody (routine testing w rflx)  Vitamin D deficiency -     VITAMIN D 25 Hydroxy (Vit-D Deficiency, Fractures)  Gastroesophageal reflux disease, unspecified whether esophagitis present -Pepcid 20 mg twice daily -     Comprehensive metabolic panel -     CBC with Differential/Platelet  Essential hypertension -Controlled -Continue labetalol 200 mg nightly -Continue lifestyle modifications -     Comprehensive metabolic panel -     TSH -     T4, free  History of anemia -     CBC with Differential/Platelet -     Iron, TIBC and Ferritin Panel -     Vitamin B12  Goiter -Stable -     TSH -     T4, free  Encounter for hepatitis C screening test for low risk patient -     Hepatitis C antibody  Elevated lipoprotein(a) -LDL 123, HDL 63.6, total cholesterol 202, triglycerides 73 on 10/29/2021 -Continue lifestyle modifications -Continue follow-up with cardiology.  Will discuss with cardiology regarding statin -     Lipid panel    No follow-ups on file.   Deeann Saint, MD

## 2022-12-11 LAB — IRON,TIBC AND FERRITIN PANEL
%SAT: 9 % (calc) — ABNORMAL LOW (ref 16–45)
Ferritin: 21 ng/mL (ref 16–154)
Iron: 37 ug/dL — ABNORMAL LOW (ref 40–190)
TIBC: 410 mcg/dL (calc) (ref 250–450)

## 2022-12-11 LAB — HEPATITIS C ANTIBODY: Hepatitis C Ab: NONREACTIVE

## 2022-12-11 LAB — HIV ANTIBODY (ROUTINE TESTING W REFLEX): HIV 1&2 Ab, 4th Generation: NONREACTIVE

## 2022-12-15 ENCOUNTER — Other Ambulatory Visit (HOSPITAL_COMMUNITY): Payer: Self-pay

## 2022-12-15 ENCOUNTER — Other Ambulatory Visit: Payer: Self-pay | Admitting: Family Medicine

## 2022-12-15 DIAGNOSIS — D509 Iron deficiency anemia, unspecified: Secondary | ICD-10-CM

## 2022-12-15 DIAGNOSIS — E559 Vitamin D deficiency, unspecified: Secondary | ICD-10-CM

## 2022-12-15 MED ORDER — FERROUS GLUCONATE 324 (38 FE) MG PO TABS
324.0000 mg | ORAL_TABLET | Freq: Every day | ORAL | 3 refills | Status: AC
Start: 2022-12-15 — End: ?
  Filled 2022-12-15: qty 90, 90d supply, fill #0

## 2022-12-15 MED ORDER — VITAMIN D (ERGOCALCIFEROL) 1.25 MG (50000 UNIT) PO CAPS
50000.0000 [IU] | ORAL_CAPSULE | ORAL | 0 refills | Status: DC
Start: 2022-12-15 — End: 2023-12-12
  Filled 2022-12-15: qty 4, 28d supply, fill #0
  Filled 2023-05-10: qty 4, 28d supply, fill #1
  Filled 2023-08-04: qty 4, 28d supply, fill #2

## 2022-12-23 ENCOUNTER — Other Ambulatory Visit (HOSPITAL_COMMUNITY): Payer: Self-pay

## 2022-12-23 ENCOUNTER — Other Ambulatory Visit: Payer: Self-pay

## 2022-12-23 ENCOUNTER — Encounter: Payer: Self-pay | Admitting: Cardiology

## 2022-12-23 ENCOUNTER — Ambulatory Visit: Payer: 59 | Attending: Cardiology | Admitting: Cardiology

## 2022-12-23 VITALS — BP 124/76 | HR 91 | Ht 64.0 in | Wt 158.0 lb

## 2022-12-23 DIAGNOSIS — E782 Mixed hyperlipidemia: Secondary | ICD-10-CM

## 2022-12-23 DIAGNOSIS — Z01812 Encounter for preprocedural laboratory examination: Secondary | ICD-10-CM | POA: Diagnosis not present

## 2022-12-23 DIAGNOSIS — Z79899 Other long term (current) drug therapy: Secondary | ICD-10-CM

## 2022-12-23 DIAGNOSIS — I10 Essential (primary) hypertension: Secondary | ICD-10-CM | POA: Diagnosis not present

## 2022-12-23 DIAGNOSIS — R072 Precordial pain: Secondary | ICD-10-CM

## 2022-12-23 MED ORDER — ATORVASTATIN CALCIUM 10 MG PO TABS
10.0000 mg | ORAL_TABLET | Freq: Every day | ORAL | 3 refills | Status: DC
  Filled 2022-12-23: qty 30, 30d supply, fill #0
  Filled 2023-01-23: qty 30, 30d supply, fill #1
  Filled 2023-02-24: qty 30, 30d supply, fill #2
  Filled 2023-03-26 (×2): qty 30, 30d supply, fill #3
  Filled 2023-05-10: qty 30, 30d supply, fill #4
  Filled 2023-08-04: qty 30, 30d supply, fill #5
  Filled 2023-11-06 – 2023-11-10 (×2): qty 30, 30d supply, fill #6

## 2022-12-23 MED ORDER — METOPROLOL TARTRATE 100 MG PO TABS
ORAL_TABLET | ORAL | 0 refills | Status: DC
  Filled 2022-12-23: qty 1, 1d supply, fill #0

## 2022-12-23 MED ORDER — METOPROLOL SUCCINATE ER 25 MG PO TB24
12.5000 mg | ORAL_TABLET | Freq: Every day | ORAL | 3 refills | Status: DC
  Filled 2022-12-23: qty 15, 30d supply, fill #0
  Filled 2023-01-23: qty 15, 30d supply, fill #1
  Filled 2023-02-24: qty 15, 30d supply, fill #2
  Filled 2023-03-26 (×3): qty 15, 30d supply, fill #3
  Filled 2023-04-29 (×3): qty 15, 30d supply, fill #4
  Filled 2023-05-30: qty 15, 30d supply, fill #5
  Filled 2023-06-29: qty 15, 30d supply, fill #6
  Filled 2023-08-04: qty 15, 30d supply, fill #7
  Filled 2023-09-10: qty 45, 90d supply, fill #8

## 2022-12-23 NOTE — Patient Instructions (Addendum)
Medication Instructions:  Your physician has recommended you make the following change in your medication:  STOP: Labetalol START: Atorvastatin (Lipitor) 10 mg (1 tablet) once daily START: Metoprolol succinate (Toprol-XL) 12.5 mg (half a tablet) once daily  *If you need a refill on your cardiac medications before your next appointment, please call your pharmacy*   Lab Work: Your physician recommends that you have labs drawn today: BMET, Mag In 12 weeks: Lipids  If you have labs (blood work) drawn today and your tests are completely normal, you will receive your results only by: MyChart Message (if you have MyChart) OR A paper copy in the mail If you have any lab test that is abnormal or we need to change your treatment, we will call you to review the results.   Testing/Procedures:   Your cardiac CT will be scheduled at one of the below locations:   [facility] [address] [place], [place] [address] [phone]    If scheduled at [facility], please arrive at the [facility] and Children's Entrance (Entrance C2) of [facility] 30 minutes prior to test start time. You can use the FREE valet parking offered at entrance C (encouraged to control the heart rate for the test)  Proceed to the [facility] Radiology Department (first floor) to check-in and test prep.  All radiology patients and guests should use entrance C2 at [facility], accessed from [facility], even though the hospital's physical address listed is [address].      Please follow these instructions carefully (unless otherwise directed):   On the Night Before the Test: Be sure to Drink plenty of water. Do not consume any caffeinated/decaffeinated beverages or chocolate 12 hours prior to your test. Do not take any antihistamines 12 hours prior to your test.  On the Day of the Test: Drink plenty of water until 1 hour prior to the test. Do not eat any  food 1 hour prior to test. You may take your regular medications prior to the test.  Take metoprolol (Lopressor) two hours prior to test. If you take Furosemide/Hydrochlorothiazide/Spironolactone, please HOLD on the morning of the test. FEMALES- please wear underwire-free bra if available, avoid dresses & tight clothing    After the Test: Drink plenty of water. After receiving IV contrast, you may experience a mild flushed feeling. This is normal. On occasion, you may experience a mild rash up to 24 hours after the test. This is not dangerous. If this occurs, you can take Benadryl 25 mg and increase your fluid intake. If you experience trouble breathing, this can be serious. If it is severe call 911 IMMEDIATELY. If it is mild, please call our office. If you take any of these medications: Glipizide/Metformin, Avandament, Glucavance, please do not take 48 hours after completing test unless otherwise instructed.  We will call to schedule your test 2-4 weeks out understanding that some insurance companies will need an authorization prior to the service being performed.   For non-scheduling related questions, please contact the cardiac imaging nurse navigator should you have any questions/concerns: Rockwell Alexandria, Cardiac Imaging Nurse Navigator Larey Brick, Cardiac Imaging Nurse Navigator [facility] Direct Office Dial: [phone]   For scheduling needs, including cancellations and rescheduling, please call [place], [phone].    Follow-Up: At [facility], you and your health needs are our priority.  As part of our continuing mission to provide you with exceptional heart care, we have  created designated Provider Care Teams.  These Care Teams include your primary Cardiologist (physician) and Advanced Practice Providers (APPs -  Physician Assistants and Nurse Practitioners) who all work together to provide you with the care you need, when you need  it.  Your next appointment:   4 month(s) via MyChart  Provider:   Thomasene Ripple, DO

## 2022-12-23 NOTE — Progress Notes (Signed)
Cardio-Obstetrics Clinic  Follow Up Note   Date:  12/26/2022   ID:  Shanon Brow [place], [place] Meo Jun 08, 1986, MRN [id number]  PCP:  Deeann Saint, MD   [facility] Providers Cardiologist:  Thomasene Ripple, DO  Electrophysiologist:  None        Referring MD: Deeann Saint, MD   Chief Complaint: " I am doing well"  History of Present Illness:    Gracious Liebenow is a 36 y.o. female [G2P1102] who returns for follow up of postpartum hypertension.  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  She initially started on antihypertensive medications but was able to wean off.  She also has history of PE/DVT which was provoked and she completed 6 weeks of Xarelto.    I did see the patient virtually on November 27, 2021.  At that time we continued her labetalol.  We discussed that we will going to titrate this down to eventually.  She has been experiencing some hypotension therefore she cut back on the labetalol and now 200 mg daily.  I agree with this.  I saw the patient on February 25, 2022 at that time she was postpartum.  We were attempting to check her off of her blood pressure medication.  We did not start lipid lowering medication due to the fact that she was breast-feeding.  She requested to be seen because recently she had a lipid profile by her PCP and is here to discuss the lipid results. She reports today that recently she experienced chest tightness - intermittent- no associated shortness of breath. No occurring today.   Her blood pressure has been controlled more on the low normal side.    Prior CV Studies Reviewed: The following studies were reviewed today:   Past Medical History:  Diagnosis Date   Anemia    DVT (deep venous thrombosis) (HCC) 2017   Endometriosis 01/08/2021   Essential hypertension 09/16/2014   History of placenta previa 05/14/2021   Not seen on 2022 pregnancy   Hyperlipidemia    Pregnancy resulting from  in vitro fertilization, antepartum 04/12/2018   Patient of Dr. April Manson.  EDD 11/10/2018 by IVF dating.  [x]  fetal echo: normal except known PACs   Pulmonary embolism (HCC) 09/2015   Vitamin D deficiency 08/12/2017    Past Surgical History:  Procedure Laterality Date   CESAREAN SECTION N/A 08/27/2018   Procedure: CESAREAN SECTION;  Surgeon: Adam Phenix, MD;  Location: [facility] BIRTHING SUITES;  Service: Obstetrics;  Laterality: N/A;   CESAREAN SECTION N/A 11/06/2021   Procedure: CESAREAN SECTION;  Surgeon: Reva Bores, MD;  Location: [facility] LD ORS;  Service: Obstetrics;  Laterality: N/A;   CHOLECYSTECTOMY     DIAGNOSTIC LAPAROSCOPY  11/2020   LOA, right salpingectoy      OB History     Gravida  2   Para  2   Term  1   Preterm  1   AB  0   Living  2      SAB  0   IAB  0   Ectopic  0   Multiple  0   Live Births  2               Current Medications: Current Meds  Medication Sig   atorvastatin (LIPITOR) 10 MG tablet Take 1 tablet (10 mg total) by mouth daily.   famotidine (PEPCID) 20 MG tablet Take 1 tablet (20 mg total) by mouth 2 (two) times daily.  ferrous gluconate (FERGON) 324 MG tablet Take 1 tablet (324 mg total) by mouth daily.   metoprolol succinate (TOPROL XL) 25 MG 24 hr tablet Take 0.5 tablets (12.5 mg total) by mouth daily.   metoprolol tartrate (LOPRESSOR) 100 MG tablet Take 2 hours prior to CT   terconazole (TERAZOL 3) 80 MG vaginal suppository Place 1 suppository (80 mg total) vaginally at bedtime. (Patient taking differently: Place 80 mg vaginally as needed.)   Vitamin D, Ergocalciferol, (DRISDOL) 1.25 MG (50000 UNIT) CAPS capsule Take 1 capsule (50,000 Units total) by mouth every 7 (seven) days.   [DISCONTINUED] labetalol (NORMODYNE) 200 MG tablet Take 1 tablet (200 mg total) by mouth at bedtime.     Allergies:   Patient has no known allergies.   Social History   Socioeconomic History   Marital status: Married    Spouse name: Not on file    Number of children: Not on file   Years of education: Not on file   Highest education level: Not on file  Occupational History   Not on file  Tobacco Use   Smoking status: Never   Smokeless tobacco: Never  Vaping Use   Vaping Use: Never used  Substance and Sexual Activity   Alcohol use: Not Currently    Alcohol/week: 0.0 standard drinks of alcohol    Comment: rarely   Drug use: No   Sexual activity: Yes    Birth control/protection: None  Other Topics Concern   Not on file  Social History Narrative   Not on file   Social Determinants of Health   Financial Resource Strain: Low Risk  (06/08/2021)   Overall Financial Resource Strain (CARDIA)    Difficulty of Paying Living Expenses: Not hard at all  Food Insecurity: No Food Insecurity (10/29/2021)   Hunger Vital Sign    Worried About Running Out of Food in the Last Year: Never true    Ran Out of Food in the Last Year: Never true  Transportation Needs: No Transportation Needs (10/29/2021)   PRAPARE - Administrator, Civil Service (Medical): No    Lack of Transportation (Non-Medical): No  Physical Activity: Not on file  Stress: Not on file  Social Connections: Not on file      Family History  Problem Relation Age of Onset   Hypertension Mother    Hypertension Father    Diabetes Father    Cancer Father       ROS:   Please see the history of present illness.     All other systems reviewed and are negative.   Labs/EKG Reviewed:    EKG:   EKG is was not ordered today.    Recent Labs: 12/10/2022: ALT 15; Hemoglobin 12.0; Platelets 314.0; TSH 1.51 12/23/2022: BUN 5; Creatinine, Ser 0.94; Magnesium 1.6; Potassium 4.4; Sodium 140   Recent Lipid Panel Lab Results  Component Value Date/Time   CHOL 201 (H) 12/10/2022 09:54 AM   TRIG 58.0 12/10/2022 09:54 AM   HDL 47.40 12/10/2022 09:54 AM   CHOLHDL 4 12/10/2022 09:54 AM   LDLCALC 142 (H) 12/10/2022 09:54 AM    Physical Exam:    VS:  BP 124/76    Pulse 91   Ht 5\' 4"  (1.626 m)   Wt 158 lb (71.7 kg)   SpO2 98%   BMI 27.12 kg/m     Wt Readings from Last 3 Encounters:  12/23/22 158 lb (71.7 kg)  12/10/22 157 lb 3.2 oz (71.3 kg)  07/22/22 168  lb (76.2 kg)     GEN:  Well nourished, well developed in no acute distress HEENT: Normal NECK: No JVD; No carotid bruits LYMPHATICS: No lymphadenopathy CARDIAC: RRR, no murmurs, rubs, gallops RESPIRATORY:  Clear to auscultation without rales, wheezing or rhonchi  ABDOMEN: Soft, non-tender, non-distended MUSCULOSKELETAL:  No edema; No deformity  SKIN: Warm and dry NEUROLOGIC:  Alert and oriented x 3 PSYCHIATRIC:  Normal affect    Risk Assessment/Risk Calculators:                 ASSESSMENT & PLAN:    Precordial Chest pain  Hypertension Hyperlipidemia   Will stop labetalol - transition to toprol xl.  Discuss lipid lowering option, will start low intensity liptior  The symptoms chest pain is concerning, this patient does have intermediate risk for coronary artery disease and at this time I would like to pursue an ischemic evaluation in this patient.  Shared decision a coronary CTA at this time is appropriate.  I have discussed with the patient about the testing.  The patient has no IV contrast allergy and is agreeable to proceed with this test.  Patient Instructions  Medication Instructions:  Your physician has recommended you make the following change in your medication:  STOP: Labetalol START: Atorvastatin (Lipitor) 10 mg (1 tablet) once daily START: Metoprolol succinate (Toprol-XL) 12.5 mg (half a tablet) once daily  *If you need a refill on your cardiac medications before your next appointment, please call your pharmacy*   Lab Work: Your physician recommends that you have labs drawn today: BMET, Mag In 12 weeks: Lipids  If you have labs (blood work) drawn today and your tests are completely normal, you will receive your results only by: MyChart Message (if you have  MyChart) OR A paper copy in the mail If you have any lab test that is abnormal or we need to change your treatment, we will call you to review the results.   Testing/Procedures:   Your cardiac CT will be scheduled at one of the below locations:   [facility] [address] [place], [place] [address] [phone]    If scheduled at [facility], please arrive at the [facility] (Entrance C2) of [facility] 30 minutes prior to test start time. You can use the FREE valet parking offered at entrance C (encouraged to control the heart rate for the test)  Proceed to the [facility] Radiology Department (first floor) to check-in and test prep.  All radiology patients and guests should use entrance C2 at [facility], accessed from [facility], even though the hospital's physical address listed is [address].      Please follow these instructions carefully (unless otherwise directed):   On the Night Before the Test: Be sure to Drink plenty of water. Do not consume any caffeinated/decaffeinated beverages or chocolate 12 hours prior to your test. Do not take any antihistamines 12 hours prior to your test.  On the Day of the Test: Drink plenty of water until 1 hour prior to the test. Do not eat any food 1 hour prior to test. You may take your regular medications prior to the test.  Take metoprolol (Lopressor) two hours prior to test. If you take Furosemide/Hydrochlorothiazide/Spironolactone, please HOLD on the morning of the test. FEMALES- please wear underwire-free bra if available, avoid dresses & tight clothing    After the Test: Drink plenty of water. After receiving IV contrast, you may  experience a mild flushed feeling. This is normal. On occasion, you may experience a mild rash up to 24 hours after the test. This is not dangerous. If this occurs, you can take Benadryl 25 mg and increase your fluid  intake. If you experience trouble breathing, this can be serious. If it is severe call 911 IMMEDIATELY. If it is mild, please call our office. If you take any of these medications: Glipizide/Metformin, Avandament, Glucavance, please do not take 48 hours after completing test unless otherwise instructed.  We will call to schedule your test 2-4 weeks out understanding that some insurance companies will need an authorization prior to the service being performed.   For non-scheduling related questions, please contact the cardiac imaging nurse navigator should you have any questions/concerns: Rockwell Alexandria, Cardiac Imaging Nurse Navigator Larey Brick, Cardiac Imaging Nurse Navigator [facility] Direct Office Dial: [phone]   For scheduling needs, including cancellations and rescheduling, please call [place], [phone].    Follow-Up: At [facility], you and your health needs are our priority.  As part of our continuing mission to provide you with exceptional heart care, we have created designated Provider Care Teams.  These Care Teams include your primary Cardiologist (physician) and Advanced Practice Providers (APPs -  Physician Assistants and Nurse Practitioners) who all work together to provide you with the care you need, when you need it.  Your next appointment:   4 month(s) via MyChart  Provider:   Thomasene Ripple, DO    Dispo:  No follow-ups on file.   Medication Adjustments/Labs and Tests Ordered: Current medicines are reviewed at length with the patient today.  Concerns regarding medicines are outlined above.  Tests Ordered: Orders Placed This Encounter  Procedures   CT CORONARY MORPH W/CTA COR W/SCORE W/CA W/CM &/OR WO/CM   Basic Metabolic Panel (BMET)   Magnesium   Lipid panel   EKG 12-Lead   Medication Changes: Meds ordered this encounter  Medications   metoprolol tartrate (LOPRESSOR) 100 MG tablet    Sig: Take 2 hours prior  to CT    Dispense:  1 tablet    Refill:  0   metoprolol succinate (TOPROL XL) 25 MG 24 hr tablet    Sig: Take 0.5 tablets (12.5 mg total) by mouth daily.    Dispense:  45 tablet    Refill:  3   atorvastatin (LIPITOR) 10 MG tablet    Sig: Take 1 tablet (10 mg total) by mouth daily.    Dispense:  90 tablet    Refill:  3

## 2022-12-24 LAB — BASIC METABOLIC PANEL
BUN/Creatinine Ratio: 5 — ABNORMAL LOW (ref 9–23)
BUN: 5 mg/dL — ABNORMAL LOW (ref 6–20)
CO2: 25 mmol/L (ref 20–29)
Calcium: 9.8 mg/dL (ref 8.7–10.2)
Chloride: 101 mmol/L (ref 96–106)
Creatinine, Ser: 0.94 mg/dL (ref 0.57–1.00)
Glucose: 87 mg/dL (ref 70–99)
Potassium: 4.4 mmol/L (ref 3.5–5.2)
Sodium: 140 mmol/L (ref 134–144)
eGFR: 79 mL/min/{1.73_m2} (ref >59)

## 2022-12-24 LAB — MAGNESIUM: Magnesium: 1.6 mg/dL (ref 1.6–2.3)

## 2022-12-25 ENCOUNTER — Other Ambulatory Visit (HOSPITAL_COMMUNITY): Payer: Self-pay

## 2023-01-04 ENCOUNTER — Telehealth (HOSPITAL_COMMUNITY): Payer: Self-pay | Admitting: Emergency Medicine

## 2023-01-04 NOTE — Telephone Encounter (Signed)
Reaching out to patient to offer assistance regarding upcoming cardiac imaging study; pt verbalizes understanding of appt date/time, parking situation and where to check in, pre-test NPO status and medications ordered, and verified current allergies; name and call back number provided for further questions should they arise Shemeka Wardle RN Navigator Cardiac Imaging [facility] [phone] office [phone] cell 

## 2023-01-05 ENCOUNTER — Ambulatory Visit (HOSPITAL_COMMUNITY)
Admission: RE | Admit: 2023-01-05 | Discharge: 2023-01-05 | Disposition: A | Discharge status: Home / Self Care | Payer: 59 | Source: Ambulatory Visit | Attending: Cardiology | Admitting: Cardiology

## 2023-01-05 DIAGNOSIS — R072 Precordial pain: Secondary | ICD-10-CM | POA: Diagnosis present

## 2023-01-05 MED ORDER — METOPROLOL TARTRATE 5 MG/5ML IV SOLN
5.0000 mg | INTRAVENOUS | Status: DC | PRN
  Administered 2023-01-05: 5 mg via INTRAVENOUS

## 2023-01-05 MED ORDER — NITROGLYCERIN 0.4 MG SL SUBL
0.8000 mg | SUBLINGUAL_TABLET | Freq: Once | SUBLINGUAL | Status: AC
  Administered 2023-01-05: 0.8 mg via SUBLINGUAL

## 2023-01-05 MED ORDER — NITROGLYCERIN 0.4 MG SL SUBL
SUBLINGUAL_TABLET | SUBLINGUAL | Status: AC
  Filled 2023-01-05: qty 2

## 2023-01-05 MED ORDER — METOPROLOL TARTRATE 5 MG/5ML IV SOLN
INTRAVENOUS | Status: AC
  Filled 2023-01-05: qty 10

## 2023-01-05 MED ORDER — IOHEXOL 350 MG/ML SOLN
95.0000 mL | Freq: Once | INTRAVENOUS | Status: AC | PRN
  Administered 2023-01-05: 95 mL via INTRAVENOUS

## 2023-01-24 ENCOUNTER — Encounter: Payer: Self-pay | Admitting: Pharmacist

## 2023-01-24 ENCOUNTER — Other Ambulatory Visit: Payer: Self-pay

## 2023-01-24 ENCOUNTER — Other Ambulatory Visit (HOSPITAL_COMMUNITY): Payer: Self-pay

## 2023-01-26 IMAGING — US US CAROTID DUPLEX BILAT
1 series · 13 of 24 positions shown · non-contrast
Comparison: None.

CLINICAL DATA: Mild bilateral carotid bruits

EXAM:
BILATERAL CAROTID DUPLEX ULTRASOUND
TECHNIQUE: Gray scale imaging, color Doppler and duplex ultrasound were
performed of bilateral carotid and vertebral arteries in the neck.

[Series 1: us carotid duplex bilat · 0.05mm/px · 13 of 60 slices shown]
[im 1/60]
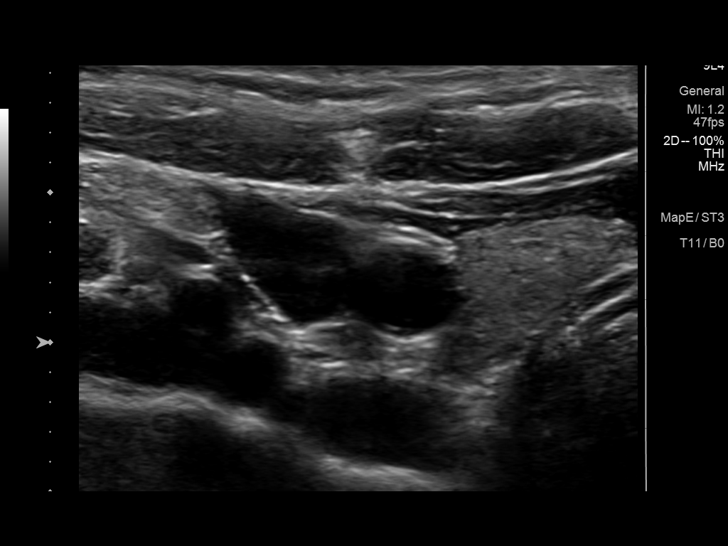
[im 6/60]
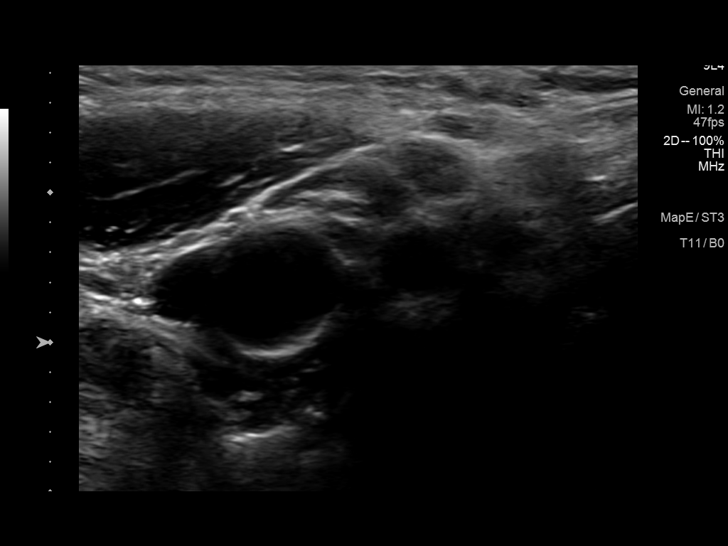
[im 11/60]
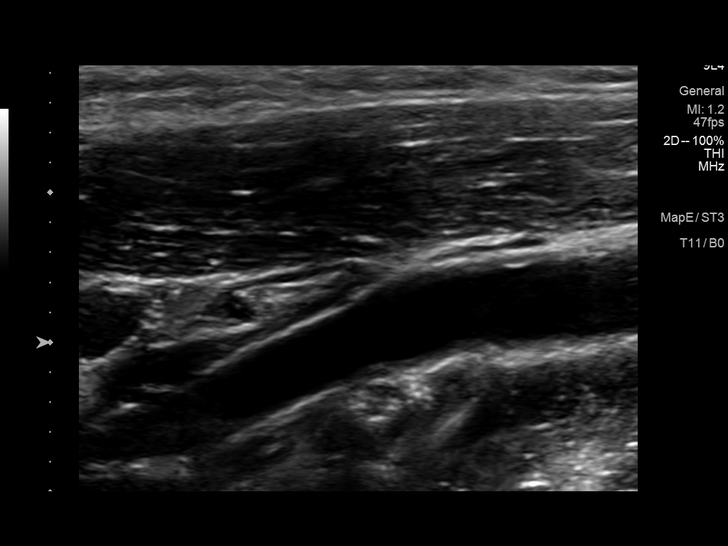
[im 16/60]
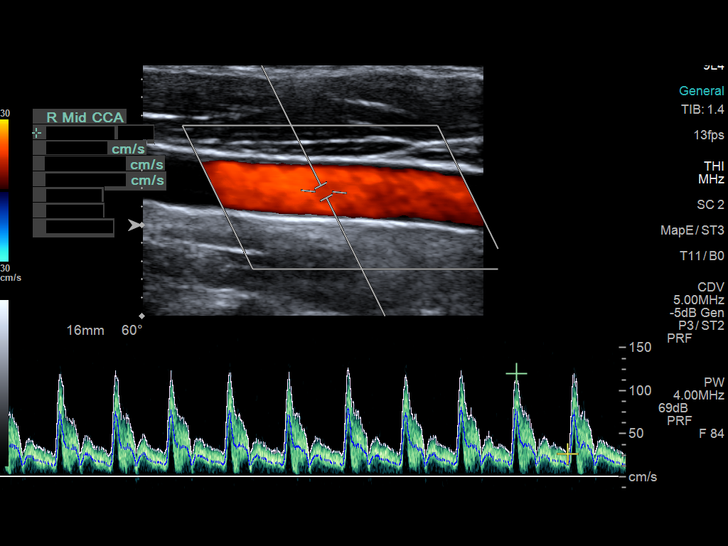
[im 21/60]
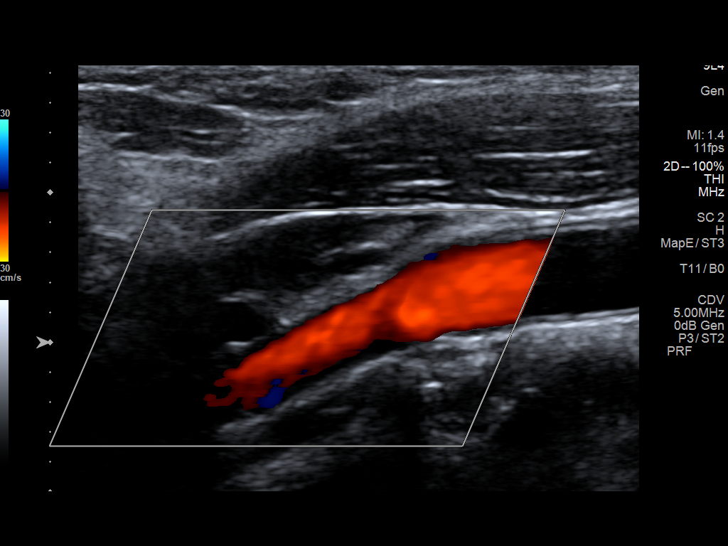
[im 26/60]
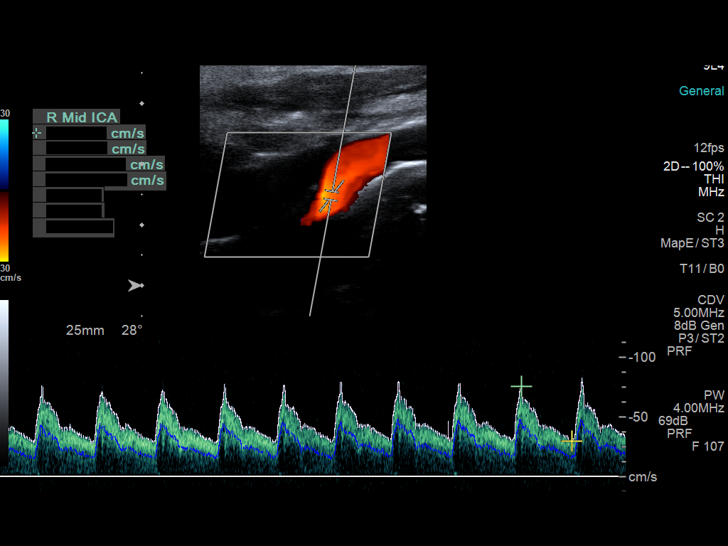
[im 31/60]
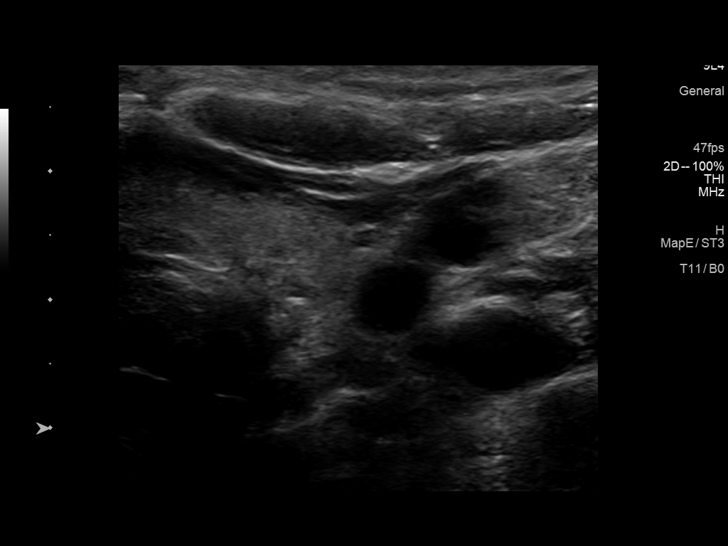
[im 34/60]
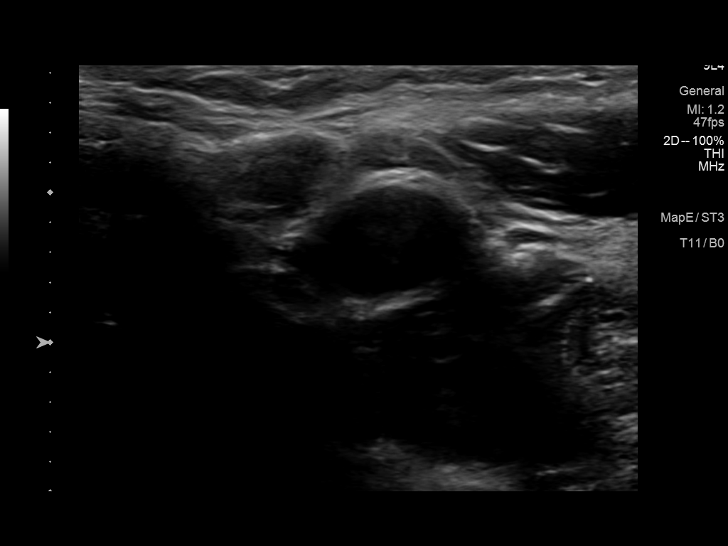
[im 39/60]
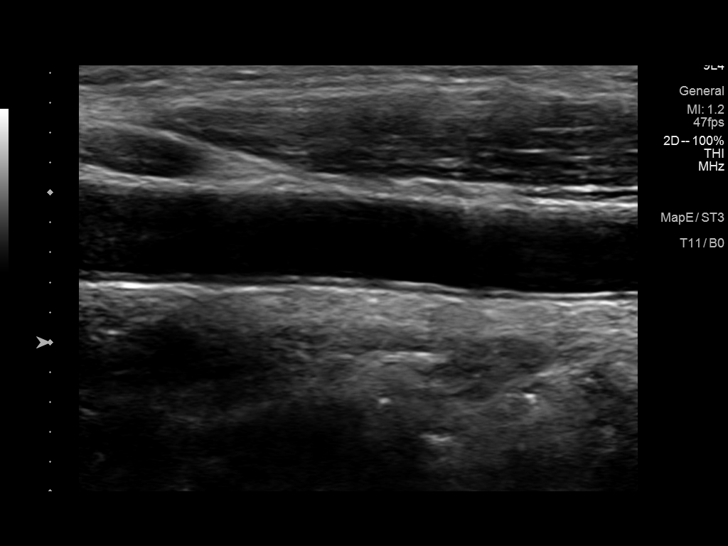
[im 44/60]
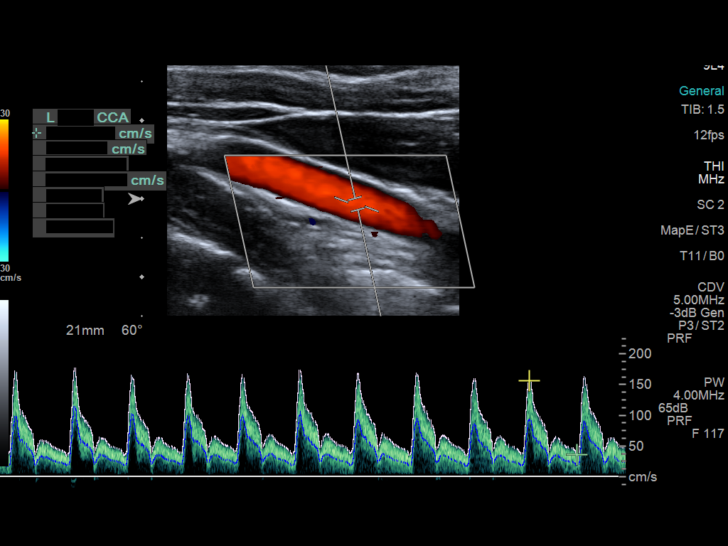
[im 49/60]
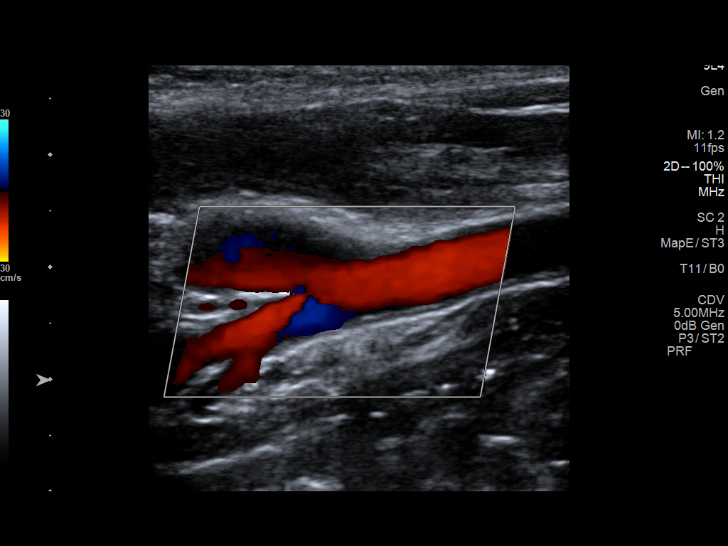
[im 54/60]
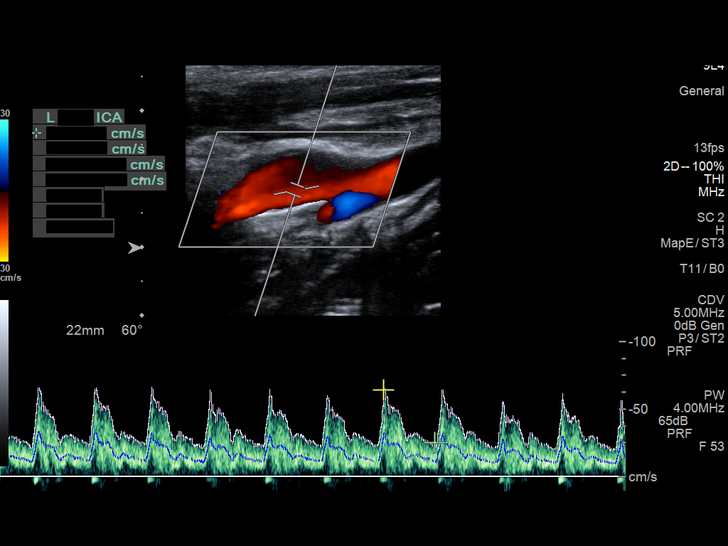
[im 60/60]
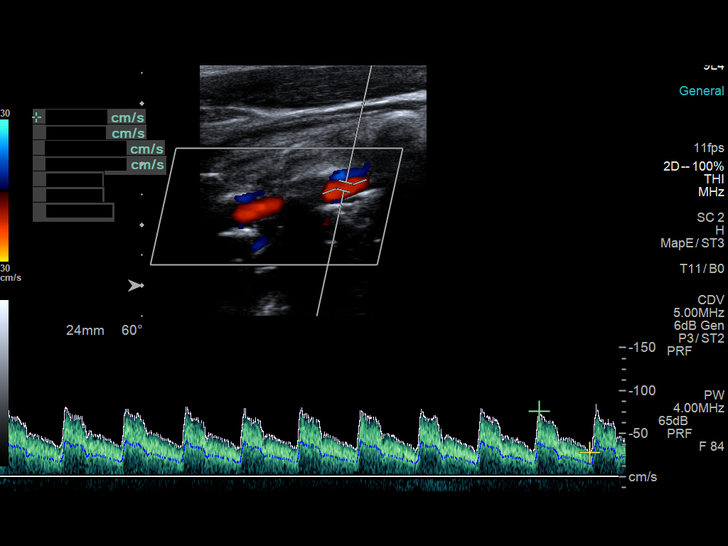

[13 of 24 positions shown; findings below may reference images not displayed]

FINDINGS: Criteria: Quantification of carotid stenosis is based on velocity
parameters that correlate the residual internal carotid diameter
with NASCET-based stenosis levels, using the diameter of the distal
internal carotid lumen as the denominator for stenosis measurement.

The following velocity measurements were obtained:

RIGHT

ICA: 81/35 cm/sec

CCA: 141/25 cm/sec

SYSTOLIC ICA/CCA RATIO:

ECA: 98 cm/sec

LEFT

ICA: 98/39 cm/sec

CCA: 156/36 cm/sec

SYSTOLIC ICA/CCA RATIO:

ECA: 82 cm/sec

RIGHT CAROTID ARTERY: Very minor intimal thickening and early
punctate bifurcation atherosclerotic plaque formation. No
hemodynamically significant right ICA stenosis, velocity elevation,
or turbulent flow. Degree of narrowing less than 50% by ultrasound
criteria.

RIGHT VERTEBRAL ARTERY:  Normal antegrade flow

LEFT CAROTID ARTERY: Similar intimal thickening and trace early
plaque formation. No hemodynamically significant left ICA stenosis,
velocity elevation, or turbulent flow. Degree of narrowing also less
than 50%.

LEFT VERTEBRAL ARTERY:  Normal antegrade flow

Upper extremity blood pressures: RIGHT: 131/75 LEFT: 138/87
IMPRESSION: Very minimal early atherosclerotic changes without hemodynamically
significant ICA stenosis. Degree of narrowing less than 50%
bilaterally by ultrasound criteria.

Patent antegrade vertebral flow bilaterally

## 2023-02-07 ENCOUNTER — Other Ambulatory Visit: Payer: Self-pay | Admitting: Family Medicine

## 2023-02-07 DIAGNOSIS — K219 Gastro-esophageal reflux disease without esophagitis: Secondary | ICD-10-CM

## 2023-02-08 ENCOUNTER — Other Ambulatory Visit: Payer: Self-pay

## 2023-02-08 MED ORDER — FAMOTIDINE 20 MG PO TABS
20.0000 mg | ORAL_TABLET | Freq: Two times a day (BID) | ORAL | 3 refills | Status: DC
Start: 2023-02-08 — End: 2024-02-02
  Filled 2023-02-08: qty 180, 90d supply, fill #0
  Filled 2023-05-10: qty 180, 90d supply, fill #1
  Filled 2023-08-04: qty 180, 90d supply, fill #2
  Filled 2023-11-06 – 2023-11-10 (×2): qty 180, 90d supply, fill #3

## 2023-02-24 ENCOUNTER — Other Ambulatory Visit (HOSPITAL_COMMUNITY): Payer: Self-pay

## 2023-02-24 ENCOUNTER — Other Ambulatory Visit: Payer: Self-pay

## 2023-03-26 ENCOUNTER — Other Ambulatory Visit (HOSPITAL_COMMUNITY): Payer: Self-pay

## 2023-03-29 ENCOUNTER — Other Ambulatory Visit: Payer: Self-pay

## 2023-03-29 DIAGNOSIS — Z79899 Other long term (current) drug therapy: Secondary | ICD-10-CM

## 2023-03-29 NOTE — Progress Notes (Signed)
Orders for LFTs added.

## 2023-03-31 ENCOUNTER — Encounter: Payer: Self-pay | Admitting: Cardiology

## 2023-03-31 ENCOUNTER — Ambulatory Visit: Payer: 59 | Attending: Cardiology | Admitting: Cardiology

## 2023-03-31 VITALS — BP 117/78 | Ht 65.0 in | Wt 155.0 lb

## 2023-03-31 DIAGNOSIS — E782 Mixed hyperlipidemia: Secondary | ICD-10-CM | POA: Diagnosis not present

## 2023-03-31 DIAGNOSIS — I10 Essential (primary) hypertension: Secondary | ICD-10-CM

## 2023-03-31 LAB — LIPID PANEL
Chol/HDL Ratio: 2.6 ratio (ref 0.0–4.4)
Cholesterol, Total: 144 mg/dL (ref 100–199)
HDL: 56 mg/dL (ref >39)
LDL Chol Calc (NIH): 75 mg/dL (ref 0–99)
Triglycerides: 63 mg/dL (ref 0–149)
VLDL Cholesterol Cal: 13 mg/dL (ref 5–40)

## 2023-03-31 LAB — HEPATIC FUNCTION PANEL
ALT: 19 IU/L (ref 0–32)
AST: 23 IU/L (ref 0–40)
Albumin: 4.5 g/dL (ref 3.9–4.9)
Alkaline Phosphatase: 79 IU/L (ref 44–121)
Bilirubin Total: 0.7 mg/dL (ref 0.0–1.2)
Bilirubin, Direct: 0.18 mg/dL (ref 0.00–0.40)
Total Protein: 7.1 g/dL (ref 6.0–8.5)

## 2023-03-31 NOTE — Patient Instructions (Signed)
Medication Instructions:  Your physician recommends that you continue on your current medications as directed. Please refer to the Current Medication list given to you today.  *If you need a refill on your cardiac medications before your next appointment, please call your pharmacy*   Lab Work: None   Testing/Procedures: None   Follow-Up: At [facility], you and your health needs are our priority.  As part of our continuing mission to provide you with exceptional heart care, we have created designated Provider Care Teams.  These Care Teams include your primary Cardiologist (physician) and Advanced Practice Providers (APPs -  Physician Assistants and Nurse Practitioners) who all work together to provide you with the care you need, when you need it.   Your next appointment:   1 year(s)  Provider:   Kardie Tobb, DO   

## 2023-03-31 NOTE — Progress Notes (Signed)
Virtual Visit via Video Note   Because of [name]'s co-morbid illnesses, she is at least at moderate risk for complications without adequate follow up.  This format is felt to be most appropriate for this patient at this time.  All issues noted in this document were discussed and addressed.  A limited physical exam was performed with this format.  Please refer to the patient's chart for her consent to telehealth for [facility].      Date:  03/31/2023   ID:  Melissa, Ruiz 22-Apr-1986, MRN [id number]  Patient Location: Home Provider Location: Office/Clinic  PCP:  Deeann Saint, MD  Cardiologist:  Thomasene Ripple, DO  Electrophysiologist:  None   Evaluation Performed:  Follow-Up Visit  Chief Complaint:  " I am doing fine"  History of Present Illness:    Melissa Ruiz is a 37 y.o. 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  At her visit on Jan 05, 2023 she was experiencing precordial pain send the patient for coronary CTA.  This came back with no evidence of coronary artery disease. During that visit I give the patient lipitor 10 mg daily.   She is here for a follow up and has been taking her medication. She offers no complaints at this time.  The patient does not have symptoms concerning for COVID-19 infection (fever, chills, cough, or new shortness of breath).    Past Medical History:  Diagnosis Date   Anemia    DVT (deep venous thrombosis) (HCC) 2017   Endometriosis 01/08/2021   Essential hypertension 09/16/2014   History of placenta previa 05/14/2021   Not seen on 2022 pregnancy   Hyperlipidemia    Pregnancy resulting from in vitro fertilization, antepartum 04/12/2018   Patient of Dr.  April Manson.  EDD 11/10/2018 by IVF dating.  [x]  fetal echo: normal except known PACs   Pulmonary embolism (HCC) 09/2015   Vitamin D deficiency 08/12/2017   Past Surgical History:  Procedure Laterality Date   CESAREAN SECTION N/A 08/27/2018   Procedure: CESAREAN SECTION;  Surgeon: Adam Phenix, MD;  Location: [facility] BIRTHING SUITES;  Service: Obstetrics;  Laterality: N/A;   CESAREAN SECTION N/A 11/06/2021   Procedure: CESAREAN SECTION;  Surgeon: Reva Bores, MD;  Location: [facility] LD ORS;  Service: Obstetrics;  Laterality: N/A;   CHOLECYSTECTOMY     DIAGNOSTIC LAPAROSCOPY  11/2020   LOA, right salpingectoy     Current Meds  Medication Sig   atorvastatin (LIPITOR) 10 MG tablet Take 1 tablet (10 mg total) by mouth daily.   famotidine (PEPCID) 20 MG tablet Take 1 tablet (20 mg total) by mouth 2 (two) times daily.   ferrous gluconate (FERGON) 324 MG tablet Take 1 tablet (324 mg total) by mouth daily.   metoprolol succinate (TOPROL XL) 25 MG 24 hr tablet Take 0.5 tablets (12.5 mg total) by mouth daily.   terconazole (TERAZOL 3) 80 MG vaginal suppository Place 1 suppository (80 mg total) vaginally at bedtime. (Patient taking differently: Place 80 mg vaginally as needed.)   Vitamin D, Ergocalciferol, (DRISDOL) 1.25 MG (50000 UNIT) CAPS capsule Take 1 capsule (50,000 Units total) by mouth every 7 (seven) days.     Allergies:   Patient has no known allergies.   Social History  Tobacco Use   Smoking status: Never   Smokeless tobacco: Never  Vaping Use   Vaping status: Never Used  Substance Use Topics   Alcohol use: Not Currently    Alcohol/week: 0.0 standard drinks of alcohol    Comment: rarely   Drug use: No     Family Hx: The patient's family history includes Cancer in her father; Diabetes in her father; Hypertension in her father and mother.  ROS:   Please see the history of present illness.     All other systems reviewed and are negative.   Prior CV studies:   The following  studies were reviewed today:  Reviewed CCTA   Labs/Other Tests and Data Reviewed:    EKG:  No ECG reviewed.  Recent Labs: 12/10/2022: Hemoglobin 12.0; Platelets 314.0; TSH 1.51 12/23/2022: BUN 5; Creatinine, Ser 0.94; Magnesium 1.6; Potassium 4.4; Sodium 140 03/30/2023: ALT 19   Recent Lipid Panel Lab Results  Component Value Date/Time   CHOL 144 03/30/2023 10:33 AM   TRIG 63 03/30/2023 10:33 AM   HDL 56 03/30/2023 10:33 AM   CHOLHDL 2.6 03/30/2023 10:33 AM   CHOLHDL 4 12/10/2022 09:54 AM   LDLCALC 75 03/30/2023 10:33 AM    Wt Readings from Last 3 Encounters:  03/31/23 155 lb (70.3 kg)  12/23/22 158 lb (71.7 kg)  12/10/22 157 lb 3.2 oz (71.3 kg)     Objective:    Vital Signs:  Ht 5\' 5"  (1.651 m)   Wt 155 lb (70.3 kg)   BMI 25.79 kg/m      ASSESSMENT & PLAN:    Mixed hyperlipidemia-she has responded well to the Lipitor.  Her LDL 75 on her most recent lab work.  CMP is also normal. Blood pressure is within normal limits  The patient is in agreement with the above plan. The patient left the office in stable condition.  The patient will follow up in 1 year or sooner if needed.  COVID-19 Education: The signs and symptoms of COVID-19 were discussed with the patient and how to seek care for testing (follow up with PCP or arrange E-visit).  The importance of social distancing was discussed today.  Time:   Today, I have spent 15 minutes  reviewing the chart and with the patient with telehealth technology discussing the above problems.     Medication Adjustments/Labs and Tests Ordered: Current medicines are reviewed at length with the patient today.  Concerns regarding medicines are outlined above.   Tests Ordered: No orders of the defined types were placed in this encounter.   Medication Changes: No orders of the defined types were placed in this encounter.   Follow Up:  In Person in 1 year(s)  Signed, Thomasene Ripple, DO  03/31/2023 8:26 AM    [facility]

## 2023-04-29 ENCOUNTER — Other Ambulatory Visit (HOSPITAL_COMMUNITY): Payer: Self-pay

## 2023-04-29 ENCOUNTER — Other Ambulatory Visit: Payer: Self-pay

## 2023-05-10 ENCOUNTER — Other Ambulatory Visit: Payer: Self-pay

## 2023-05-30 ENCOUNTER — Other Ambulatory Visit (HOSPITAL_COMMUNITY): Payer: Self-pay

## 2023-06-29 ENCOUNTER — Other Ambulatory Visit: Payer: Self-pay

## 2023-08-04 ENCOUNTER — Other Ambulatory Visit (HOSPITAL_BASED_OUTPATIENT_CLINIC_OR_DEPARTMENT_OTHER): Payer: Self-pay

## 2023-09-12 ENCOUNTER — Other Ambulatory Visit: Payer: Self-pay

## 2023-09-20 ENCOUNTER — Encounter: Payer: Self-pay | Admitting: Obstetrics & Gynecology

## 2023-09-20 ENCOUNTER — Ambulatory Visit (INDEPENDENT_AMBULATORY_CARE_PROVIDER_SITE_OTHER): Payer: 59 | Admitting: Obstetrics & Gynecology

## 2023-09-20 ENCOUNTER — Other Ambulatory Visit (HOSPITAL_COMMUNITY)
Admission: RE | Admit: 2023-09-20 | Discharge: 2023-09-20 | Disposition: A | Discharge status: Home / Self Care | Payer: 59 | Source: Ambulatory Visit | Attending: Obstetrics & Gynecology | Admitting: Obstetrics & Gynecology

## 2023-09-20 ENCOUNTER — Other Ambulatory Visit: Payer: Self-pay | Admitting: Obstetrics & Gynecology

## 2023-09-20 VITALS — BP 120/79 | HR 84 | Ht 65.0 in | Wt 158.0 lb

## 2023-09-20 DIAGNOSIS — Z01419 Encounter for gynecological examination (general) (routine) without abnormal findings: Secondary | ICD-10-CM

## 2023-09-20 DIAGNOSIS — N6323 Unspecified lump in the left breast, lower outer quadrant: Secondary | ICD-10-CM | POA: Diagnosis not present

## 2023-09-20 DIAGNOSIS — Z862 Personal history of diseases of the blood and blood-forming organs and certain disorders involving the immune mechanism: Secondary | ICD-10-CM

## 2023-09-20 DIAGNOSIS — Z1339 Encounter for screening examination for other mental health and behavioral disorders: Secondary | ICD-10-CM

## 2023-09-20 DIAGNOSIS — Z1231 Encounter for screening mammogram for malignant neoplasm of breast: Secondary | ICD-10-CM | POA: Diagnosis not present

## 2023-09-20 NOTE — Progress Notes (Signed)
GYNECOLOGY ANNUAL PREVENTATIVE CARE ENCOUNTER NOTE  History:     Rocsi Hazelbaker is a 38 y.o. [phone] female here for a routine annual gynecologic exam.  Current complaints: desires anemia panel checked as she is not able to tolerate taking oral iron.  Wants to see if she needs IV iron iron.   Denies abnormal vaginal bleeding, discharge, pelvic pain, problems with intercourse or other gynecologic concerns.    Gynecologic History Patient's last menstrual period was 09/11/2023 (approximate). Contraception:  history of bilateral salpingectomy Last Pap: 09/08/2020. Result was normal with negative HPV  Obstetric History OB History  Gravida Para Term Preterm AB Living  2 2 1 1  0 2  SAB IAB Ectopic Multiple Live Births  0 0 0 0 2    # Outcome Date GA Lbr Len/2nd Weight Sex Type Anes PTL Lv  2 Term 11/06/21 [redacted]w[redacted]d  5 lb 7.8 oz (2.49 kg) F CS-LTranv Spinal  LIV  1 Preterm 08/27/18 [redacted]w[redacted]d  3 lb 2.8 oz (1.44 kg) M CS-LTranv Gen  LIV     Complications: Placenta Previa    Past Medical History:  Diagnosis Date   Anemia    DVT (deep venous thrombosis) (HCC) 2017   Endometriosis 01/08/2021   Essential hypertension 09/16/2014   History of placenta previa 05/14/2021   Not seen on 2022 pregnancy   Hyperlipidemia    Pregnancy resulting from in vitro fertilization, antepartum 04/12/2018   Patient of Dr. April Manson.  EDD 11/10/2018 by IVF dating.  [x]  fetal echo: normal except known PACs   Pulmonary embolism (HCC) 09/2015   Vitamin D deficiency 08/12/2017    Past Surgical History:  Procedure Laterality Date   CESAREAN SECTION N/A 08/27/2018   Procedure: CESAREAN SECTION;  Surgeon: Adam Phenix, MD;  Location: [facility] BIRTHING SUITES;  Service: Obstetrics;  Laterality: N/A;   CESAREAN SECTION N/A 11/06/2021   Procedure: CESAREAN SECTION;  Surgeon: Reva Bores, MD;  Location: [facility] LD ORS;  Service: Obstetrics;  Laterality: N/A;   CHOLECYSTECTOMY     DIAGNOSTIC LAPAROSCOPY  11/2020    LOA, right salpingectoy    Current Outpatient Medications on File Prior to Visit  Medication Sig Dispense Refill   atorvastatin (LIPITOR) 10 MG tablet Take 1 tablet (10 mg total) by mouth daily. 90 tablet 3   famotidine (PEPCID) 20 MG tablet Take 1 tablet (20 mg total) by mouth 2 (two) times daily. 180 tablet 3   ferrous gluconate (FERGON) 324 MG tablet Take 1 tablet (324 mg total) by mouth daily. 90 tablet 3   metoprolol succinate (TOPROL XL) 25 MG 24 hr tablet Take 0.5 tablets (12.5 mg total) by mouth daily. 45 tablet 3   Vitamin D, Ergocalciferol, (DRISDOL) 1.25 MG (50000 UNIT) CAPS capsule Take 1 capsule (50,000 Units total) by mouth every 7 (seven) days. 12 capsule 0   terconazole (TERAZOL 3) 80 MG vaginal suppository Place 1 suppository (80 mg total) vaginally at bedtime. (Patient not taking: Reported on 09/20/2023) 3 suppository 2   No current facility-administered medications on file prior to visit.    No Known Allergies  Social History:  reports that she has never smoked. She has never used smokeless tobacco. She reports that she does not currently use alcohol. She reports that she does not use drugs.  Family History  Problem Relation Age of Onset   Hypertension Mother    Hypertension Father    Diabetes Father    Cancer Father     The following portions  of the patient's history were reviewed and updated as appropriate: allergies, current medications, past family history, past medical history, past social history, past surgical history and problem list.  Review of Systems Pertinent items noted in HPI and remainder of comprehensive ROS otherwise negative.  Physical Exam:  BP 120/79   Pulse 84   Ht 5\' 5"  (1.651 m)   Wt 158 lb (71.7 kg)   LMP 09/11/2023 (Approximate)   Breastfeeding No   BMI 26.29 kg/m  CONSTITUTIONAL: Well-developed, well-nourished female in no acute distress.  HENT:  Normocephalic, atraumatic, External right and left ear normal.  EYES: Conjunctivae  and EOM are normal. Pupils are equal, round, and reactive to light. No scleral icterus.  NECK: Normal range of motion, supple, no masses.  Normal thyroid.  SKIN: Skin is warm and dry. No rash noted. Not diaphoretic. No erythema. No pallor. MUSCULOSKELETAL: Normal range of motion. No tenderness.  No cyanosis, clubbing, or edema. NEUROLOGIC: Alert and oriented to person, place, and time. Normal reflexes, muscle tone coordination.  PSYCHIATRIC: Normal mood and affect. Normal behavior. Normal judgment and thought content. CARDIOVASCULAR: Normal heart rate noted, regular rhythm RESPIRATORY: Clear to auscultation bilaterally. Effort and breath sounds normal, no problems with respiration noted. BREASTS: Symmetric in size. 1.5 cm mobile mass palpated in 8 o'clock position, about 7 cm from nipple, in lower inner quadrant of left breast. Mass was tender to palpation.  No other masses, tenderness, skin changes, nipple drainage, or lymphadenopathy bilaterally. Performed in the presence of a chaperone. ABDOMEN: Soft, no distention noted.  No tenderness, rebound or guarding.  PELVIC: Normal appearing external genitalia and urethral meatus; normal appearing vaginal mucosa and cervix.  No abnormal vaginal discharge noted.  Pap smear obtained.  Normal uterine size, no other palpable masses, no uterine or adnexal tenderness.  Performed in the presence of a chaperone.   Assessment and Plan:     1. Mass of lower outer quadrant of left breast 2. Breast cancer screening by mammogram Imaging studies ordered for evaluation, will follow up results and manage accordingly. - MM 3D DIAGNOSTIC MAMMOGRAM BILATERAL BREAST; Future - [place] LIMITED ULTRASOUND INCLUDING AXILLA LEFT BREAST ; Future  3. History of anemia - Anemia Profile B checked, will follow up results and manage accordingly.  4. Well woman exam with routine gynecological exam (Primary) - Cytology - PAP Will follow up results of pap smear and manage  accordingly. Routine preventative health maintenance measures emphasized. Please refer to After Visit Summary for other counseling recommendations.      Jaynie Collins, MD, FACOG Obstetrician & Gynecologist, [facility] for [employer], [facility]

## 2023-09-21 ENCOUNTER — Ambulatory Visit
Admission: RE | Admit: 2023-09-21 | Discharge: 2023-09-21 | Disposition: A | Discharge status: Home / Self Care | Payer: 59 | Source: Ambulatory Visit | Attending: Obstetrics & Gynecology | Admitting: Obstetrics & Gynecology

## 2023-09-21 ENCOUNTER — Encounter: Payer: Self-pay | Admitting: Obstetrics & Gynecology

## 2023-09-21 ENCOUNTER — Ambulatory Visit
Admission: RE | Admit: 2023-09-21 | Discharge: 2023-09-21 | Discharge status: Home / Self Care | Payer: 59 | Source: Ambulatory Visit | Attending: Obstetrics & Gynecology | Admitting: Obstetrics & Gynecology

## 2023-09-21 DIAGNOSIS — N6323 Unspecified lump in the left breast, lower outer quadrant: Secondary | ICD-10-CM

## 2023-09-21 DIAGNOSIS — Z1231 Encounter for screening mammogram for malignant neoplasm of breast: Secondary | ICD-10-CM

## 2023-09-21 DIAGNOSIS — N6324 Unspecified lump in the left breast, lower inner quadrant: Secondary | ICD-10-CM | POA: Diagnosis not present

## 2023-09-21 LAB — ANEMIA PROFILE B
Basophils Absolute: 0 10*3/uL (ref 0.0–0.2)
Basos: 0 %
EOS (ABSOLUTE): 0.2 10*3/uL (ref 0.0–0.4)
Eos: 4 %
Ferritin: 18 ng/mL (ref 15–150)
Folate: 10 ng/mL (ref >3.0)
Hematocrit: 37.4 % (ref 34.0–46.6)
Hemoglobin: 12.3 g/dL (ref 11.1–15.9)
Immature Grans (Abs): 0 10*3/uL (ref 0.0–0.1)
Immature Granulocytes: 0 %
Iron Saturation: 16 % (ref 15–55)
Iron: 69 ug/dL (ref 27–159)
Lymphocytes Absolute: 2.1 10*3/uL (ref 0.7–3.1)
Lymphs: 46 %
MCH: 29.6 pg (ref 26.6–33.0)
MCHC: 32.9 g/dL (ref 31.5–35.7)
MCV: 90 fL (ref 79–97)
Monocytes Absolute: 0.3 10*3/uL (ref 0.1–0.9)
Monocytes: 7 %
Neutrophils Absolute: 2 10*3/uL (ref 1.4–7.0)
Neutrophils: 43 %
Platelets: 282 10*3/uL (ref 150–450)
RBC: 4.15 x10E6/uL (ref 3.77–5.28)
RDW: 13.1 % (ref 11.7–15.4)
Retic Ct Pct: 0.9 % (ref 0.6–2.6)
Total Iron Binding Capacity: 441 ug/dL (ref 250–450)
UIBC: 372 ug/dL (ref 131–425)
Vitamin B-12: 813 pg/mL (ref 232–1245)
WBC: 4.6 10*3/uL (ref 3.4–10.8)

## 2023-09-23 LAB — CYTOLOGY - PAP
Chlamydia: NEGATIVE
Comment: NEGATIVE
Comment: NEGATIVE
Comment: NEGATIVE
Comment: NORMAL
Diagnosis: NEGATIVE
High risk HPV: NEGATIVE
Neisseria Gonorrhea: NEGATIVE
Trichomonas: NEGATIVE

## 2023-09-24 ENCOUNTER — Encounter: Payer: Self-pay | Admitting: Obstetrics & Gynecology

## 2023-11-07 ENCOUNTER — Other Ambulatory Visit (HOSPITAL_COMMUNITY): Payer: Self-pay

## 2023-11-08 ENCOUNTER — Other Ambulatory Visit: Payer: Self-pay

## 2023-11-10 ENCOUNTER — Other Ambulatory Visit (HOSPITAL_COMMUNITY): Payer: Self-pay

## 2023-11-10 ENCOUNTER — Other Ambulatory Visit: Payer: Self-pay

## 2023-12-12 ENCOUNTER — Encounter: Payer: Self-pay | Admitting: Family Medicine

## 2023-12-12 ENCOUNTER — Other Ambulatory Visit (HOSPITAL_BASED_OUTPATIENT_CLINIC_OR_DEPARTMENT_OTHER): Payer: Self-pay

## 2023-12-12 ENCOUNTER — Ambulatory Visit (INDEPENDENT_AMBULATORY_CARE_PROVIDER_SITE_OTHER): Payer: 59 | Admitting: Family Medicine

## 2023-12-12 ENCOUNTER — Other Ambulatory Visit (HOSPITAL_COMMUNITY): Payer: Self-pay

## 2023-12-12 VITALS — BP 108/68 | HR 85 | Temp 98.2°F | Ht 65.0 in | Wt 157.2 lb

## 2023-12-12 DIAGNOSIS — E7841 Elevated Lipoprotein(a): Secondary | ICD-10-CM | POA: Diagnosis not present

## 2023-12-12 DIAGNOSIS — E559 Vitamin D deficiency, unspecified: Secondary | ICD-10-CM

## 2023-12-12 DIAGNOSIS — E049 Nontoxic goiter, unspecified: Secondary | ICD-10-CM

## 2023-12-12 DIAGNOSIS — I10 Essential (primary) hypertension: Secondary | ICD-10-CM

## 2023-12-12 DIAGNOSIS — Z0000 Encounter for general adult medical examination without abnormal findings: Secondary | ICD-10-CM | POA: Diagnosis not present

## 2023-12-12 DIAGNOSIS — D509 Iron deficiency anemia, unspecified: Secondary | ICD-10-CM

## 2023-12-12 LAB — COMPREHENSIVE METABOLIC PANEL WITH GFR
ALT: 24 U/L (ref 0–35)
AST: 24 U/L (ref 0–37)
Albumin: 4.4 g/dL (ref 3.5–5.2)
Alkaline Phosphatase: 57 U/L (ref 39–117)
BUN: 9 mg/dL (ref 6–23)
CO2: 25 meq/L (ref 19–32)
Calcium: 9.4 mg/dL (ref 8.4–10.5)
Chloride: 104 meq/L (ref 96–112)
Creatinine, Ser: 0.99 mg/dL (ref 0.40–1.20)
GFR: 71.59 mL/min (ref >60.00)
Glucose, Bld: 83 mg/dL (ref 70–99)
Potassium: 3.9 meq/L (ref 3.5–5.1)
Sodium: 137 meq/L (ref 135–145)
Total Bilirubin: 0.7 mg/dL (ref 0.2–1.2)
Total Protein: 7.3 g/dL (ref 6.0–8.3)

## 2023-12-12 LAB — CBC WITH DIFFERENTIAL/PLATELET
Basophils Absolute: 0 10*3/uL (ref 0.0–0.1)
Basophils Relative: 1 % (ref 0.0–3.0)
Eosinophils Absolute: 0.5 10*3/uL (ref 0.0–0.7)
Eosinophils Relative: 11 % — ABNORMAL HIGH (ref 0.0–5.0)
HCT: 34.9 % — ABNORMAL LOW (ref 36.0–46.0)
Hemoglobin: 11.8 g/dL — ABNORMAL LOW (ref 12.0–15.0)
Lymphocytes Relative: 37.9 % (ref 12.0–46.0)
Lymphs Abs: 1.8 10*3/uL (ref 0.7–4.0)
MCHC: 33.9 g/dL (ref 30.0–36.0)
MCV: 88.3 fl (ref 78.0–100.0)
Monocytes Absolute: 0.3 10*3/uL (ref 0.1–1.0)
Monocytes Relative: 5.4 % (ref 3.0–12.0)
Neutro Abs: 2.1 10*3/uL (ref 1.4–7.7)
Neutrophils Relative %: 44.7 % (ref 43.0–77.0)
Platelets: 266 10*3/uL (ref 150.0–400.0)
RBC: 3.96 Mil/uL (ref 3.87–5.11)
RDW: 13.9 % (ref 11.5–15.5)
WBC: 4.7 10*3/uL (ref 4.0–10.5)

## 2023-12-12 LAB — LIPID PANEL
Cholesterol: 152 mg/dL (ref 0–200)
HDL: 59.6 mg/dL (ref >39.00)
LDL Cholesterol: 81 mg/dL (ref 0–99)
NonHDL: 92.75
Total CHOL/HDL Ratio: 3
Triglycerides: 57 mg/dL (ref 0.0–149.0)
VLDL: 11.4 mg/dL (ref 0.0–40.0)

## 2023-12-12 LAB — VITAMIN B12: Vitamin B-12: 480 pg/mL (ref 211–911)

## 2023-12-12 LAB — T4, FREE: Free T4: 0.7 ng/dL (ref 0.60–1.60)

## 2023-12-12 LAB — VITAMIN D 25 HYDROXY (VIT D DEFICIENCY, FRACTURES): VITD: 30.13 ng/mL (ref 30.00–100.00)

## 2023-12-12 LAB — TSH: TSH: 1.44 u[IU]/mL (ref 0.35–5.50)

## 2023-12-12 LAB — HEMOGLOBIN A1C: Hgb A1c MFr Bld: 5.9 % (ref 4.6–6.5)

## 2023-12-12 MED ORDER — METOPROLOL SUCCINATE ER 25 MG PO TB24
12.5000 mg | ORAL_TABLET | Freq: Every day | ORAL | 3 refills | Status: AC
  Filled 2023-12-12: qty 45, 90d supply, fill #0
  Filled 2024-03-19: qty 45, 90d supply, fill #1
  Filled 2024-06-12: qty 45, 90d supply, fill #2

## 2023-12-12 MED ORDER — ATORVASTATIN CALCIUM 10 MG PO TABS
10.0000 mg | ORAL_TABLET | Freq: Every day | ORAL | 3 refills | Status: AC
  Filled 2023-12-12: qty 90, 90d supply, fill #0
  Filled 2024-08-06: qty 90, 90d supply, fill #1

## 2023-12-12 NOTE — Progress Notes (Signed)
 Established Patient Office Visit   Subjective  Patient ID: Melissa Ruiz, female    DOB: [birth year]  Age: 38 y.o. MRN: [id number]  Chief Complaint  Patient presents with   Annual Exam    Patient is a 38 year old female seen for CPE and follow-up.  Patient states she has been doing well overall.  No longer finding famotidine  as effective.  Managing GERD symptoms by trying not to eat after 7 and being mindful of foods eaten.  Using Tums little more frequently for symptoms.  Patient notes improvement in menses, not as heavy.  Not currently taking iron  supplement.  Followed by OB/GYN.  Had ultrasound and mammogram due to concern for possible mass.  Imaging negative.  Previously seen by cardiology for history of elevated lipoprotein a and HTN.    Patient Active Problem List   Diagnosis Date Noted   Snoring 07/27/2022   History of gestational diabetes 12/07/2021   Chronic hypertension 12/07/2021   H/O unilateral salpingectomy 11/06/2021   Pelvic adhesive disease 09/12/2021   Anticoagulated 07/27/2021   Mixed hyperlipidemia 06/08/2021   Endometriosis 01/08/2021   History of pulmonary embolus (PE) 08/12/2017   History of DVT (deep vein thrombosis) 08/12/2017   History of goiter 08/12/2017   Past Medical History:  Diagnosis Date   Anemia    DVT (deep venous thrombosis) (HCC) 2017   Endometriosis 01/08/2021   Essential hypertension 09/16/2014   History of placenta previa 05/14/2021   Not seen on 2022 pregnancy   Hyperlipidemia    Pregnancy resulting from in vitro fertilization, antepartum 04/12/2018   Patient of Dr. Yalcinkaya.  EDD 11/10/2018 by IVF dating.  [x]  fetal echo: normal except known PACs   Pulmonary embolism (HCC) 09/2015   Vitamin D  deficiency 08/12/2017   Past Surgical History:  Procedure Laterality Date   CESAREAN SECTION N/A 08/27/2018   Procedure: CESAREAN SECTION;  Surgeon: Tresia Fruit, MD;  Location: [facility] BIRTHING SUITES;  Service: Obstetrics;   Laterality: N/A;   CESAREAN SECTION N/A 11/06/2021   Procedure: CESAREAN SECTION;  Surgeon: Granville Layer, MD;  Location: [facility] LD ORS;  Service: Obstetrics;  Laterality: N/A;   CHOLECYSTECTOMY     DIAGNOSTIC LAPAROSCOPY  11/2020   LOA, right salpingectoy   Social History   Tobacco Use   Smoking status: Never   Smokeless tobacco: Never  Vaping Use   Vaping status: Never Used  Substance Use Topics   Alcohol use: Not Currently    Alcohol/week: 0.0 standard drinks of alcohol    Comment: rarely   Drug use: No   Family History  Problem Relation Age of Onset   Hypertension Mother    Hypertension Father    Diabetes Father    Cancer Father    No Known Allergies    ROS Negative unless stated above    Objective:     BP 108/68 (BP Location: Left Arm, Patient Position: Sitting, Cuff Size: Normal)   Pulse 85   Temp 98.2 F (36.8 C) (Oral)   Ht 5\' 5"  (1.651 m)   Wt 157 lb 3.2 oz (71.3 kg)   LMP 12/04/2023 (Approximate)   SpO2 96%   BMI 26.16 kg/m  BP Readings from Last 3 Encounters:  12/12/23 108/68  09/20/23 120/79  03/31/23 117/78   Wt Readings from Last 3 Encounters:  12/12/23 157 lb 3.2 oz (71.3 kg)  09/20/23 158 lb (71.7 kg)  03/31/23 155 lb (70.3 kg)      Physical Exam Constitutional:  Appearance: Normal appearance.  HENT:     Head: Normocephalic and atraumatic.     Right Ear: Tympanic membrane, ear canal and external ear normal.     Left Ear: Tympanic membrane, ear canal and external ear normal.     Nose: Nose normal.     Mouth/Throat:     Mouth: Mucous membranes are moist.     Pharynx: No oropharyngeal exudate or posterior oropharyngeal erythema.  Eyes:     General: No scleral icterus.    Extraocular Movements: Extraocular movements intact.     Conjunctiva/sclera: Conjunctivae normal.     Pupils: Pupils are equal, round, and reactive to light.  Neck:     Thyroid : Thyromegaly present.     Comments: Goiter. Cardiovascular:     Rate and Rhythm:  Normal rate and regular rhythm.     Pulses: Normal pulses.     Heart sounds: Normal heart sounds. No murmur heard.    No friction rub.  Pulmonary:     Effort: Pulmonary effort is normal.     Breath sounds: Normal breath sounds. No wheezing, rhonchi or rales.  Abdominal:     General: Bowel sounds are normal.     Palpations: Abdomen is soft.     Tenderness: There is no abdominal tenderness.  Musculoskeletal:        General: No deformity. Normal range of motion.  Lymphadenopathy:     Cervical: No cervical adenopathy.  Skin:    General: Skin is warm and dry.     Findings: No lesion.  Neurological:     General: No focal deficit present.     Mental Status: She is alert and oriented to person, place, and time.  Psychiatric:        Mood and Affect: Mood normal.        Thought Content: Thought content normal.        12/12/2023    9:49 AM 09/20/2023    1:18 PM 12/10/2022    9:54 AM  Depression screen PHQ 2/9  Decreased Interest 0 0 0  Down, Depressed, Hopeless 0 0 0  PHQ - 2 Score 0 0 0  Altered sleeping 0 0   Tired, decreased energy 0 0   Change in appetite 0 0   Feeling bad or failure about yourself  0 0   Trouble concentrating 0 0   Moving slowly or fidgety/restless 0 0   Suicidal thoughts 0 0   PHQ-9 Score 0 0   Difficult doing work/chores Not difficult at all Not difficult at all       12/12/2023    9:48 AM 09/20/2023    1:18 PM 10/29/2021   11:05 AM 07/27/2021   11:02 AM  GAD 7 : Generalized Anxiety Score  Nervous, Anxious, on Edge 0 0 0 0  Control/stop worrying 0 0 0 0  Worry too much - different things 0 0 0 0  Trouble relaxing 0 0 0 0  Restless 0 0 0 0  Easily annoyed or irritable 0 0 0 0  Afraid - awful might happen 0 0 0 0  Total GAD 7 Score 0 0 0 0  Anxiety Difficulty Not difficult at all Not difficult at all       No results found for any visits on 12/12/23.    Assessment & Plan:  Well adult exam -     CBC with Differential/Platelet; Future -      Comprehensive metabolic panel with GFR; Future -  Hemoglobin A1c; Future -     Lipid panel; Future -     T4, free; Future -     TSH; Future  Vitamin D  deficiency -     VITAMIN D  25 Hydroxy (Vit-D Deficiency, Fractures); Future  Iron  deficiency anemia, unspecified iron  deficiency anemia type -     CBC with Differential/Platelet; Future -     Vitamin B12; Future -     VITAMIN D  25 Hydroxy (Vit-D Deficiency, Fractures); Future -     Iron , TIBC and Ferritin Panel; Future  Essential hypertension -     Comprehensive metabolic panel with GFR; Future -     T4, free; Future -     TSH; Future -     Metoprolol  Succinate ER; Take 0.5 tablets (12.5 mg total) by mouth daily.  Dispense: 45 tablet; Refill: 3  Elevated lipoprotein(a) -     Comprehensive metabolic panel with GFR; Future -     Lipid panel; Future -     TSH; Future -     Atorvastatin  Calcium ; Take 1 tablet (10 mg total) by mouth daily.  Dispense: 90 tablet; Refill: 3  Goiter -     T4, free; Future  Age-appropriate health screenings discussed.  Obtain labs.  Immunizations reviewed.  Mammogram done early.  Resume at 38. Colonoscopy done  09/21/2023.  Toprol  ER and atorvastatin  refilled.  Continue follow-up with specialist.  Resume iron  if needed based on labs.  Continue to monitor recorder.  No follow-ups on file.   Viola Greulich, MD

## 2023-12-13 LAB — IRON,TIBC AND FERRITIN PANEL
%SAT: 9 % — ABNORMAL LOW (ref 16–45)
Ferritin: 8 ng/mL — ABNORMAL LOW (ref 16–154)
Iron: 40 ug/dL (ref 40–190)
TIBC: 440 ug/dL (ref 250–450)

## 2023-12-14 ENCOUNTER — Encounter: Payer: Self-pay | Admitting: Family Medicine

## 2023-12-22 ENCOUNTER — Telehealth: Payer: Self-pay

## 2023-12-22 ENCOUNTER — Encounter: Payer: Self-pay | Admitting: Family Medicine

## 2023-12-22 DIAGNOSIS — D509 Iron deficiency anemia, unspecified: Secondary | ICD-10-CM | POA: Insufficient documentation

## 2023-12-22 DIAGNOSIS — K219 Gastro-esophageal reflux disease without esophagitis: Secondary | ICD-10-CM | POA: Insufficient documentation

## 2023-12-22 NOTE — Telephone Encounter (Signed)
 Faxed physician's orders for Ferehema 510 mg x 2

## 2023-12-23 ENCOUNTER — Telehealth: Payer: Self-pay

## 2023-12-23 NOTE — Telephone Encounter (Signed)
 Information has been faxed   Copied from CRM [id number]. Topic: General - Other >> Dec 23, 2023  1:33 PM Howard Macho wrote: Reason for CRM: tiffany from [facility] called stating the doctor put an order in for the patient to get an iron  infusion but they need demographic information for the patient Fax [phone] CB [phone]

## 2023-12-26 NOTE — Telephone Encounter (Signed)
 Copied from CRM [id number]. Topic: Clinical - Request for Lab/Test Order >> Dec 23, 2023  2:27 PM Melissa Ruiz wrote: Reason for CRM: Melissa Ruiz from [facility] called regarding orde rthat was sent to her for iron  infusion. The office needs to go on their website " [email or web address] " to fill out a plan of treatment, sign it and fax over along patients demographics to [phone]. Her call back number if needed is [phone].

## 2023-12-28 NOTE — Telephone Encounter (Signed)
 Form has been refaxed 12/28/23

## 2023-12-30 NOTE — Telephone Encounter (Signed)
 Re-faxed.

## 2023-12-30 NOTE — Telephone Encounter (Addendum)
 Melissa Ruiz with [facility] will refax the form the secondary dx is missing etc and the only medication that is covered on her insurance [employer] is  infed

## 2024-02-02 ENCOUNTER — Other Ambulatory Visit (HOSPITAL_COMMUNITY): Payer: Self-pay

## 2024-02-02 ENCOUNTER — Other Ambulatory Visit: Payer: Self-pay | Admitting: Family Medicine

## 2024-02-02 DIAGNOSIS — K219 Gastro-esophageal reflux disease without esophagitis: Secondary | ICD-10-CM

## 2024-02-02 MED ORDER — FAMOTIDINE 20 MG PO TABS
20.0000 mg | ORAL_TABLET | Freq: Two times a day (BID) | ORAL | 3 refills | Status: AC
  Filled 2024-02-02: qty 180, 90d supply, fill #0
  Filled 2024-05-04 – 2024-05-07 (×2): qty 180, 90d supply, fill #1
  Filled 2024-08-06: qty 180, 90d supply, fill #2

## 2024-02-06 ENCOUNTER — Telehealth: Payer: Self-pay

## 2024-02-06 ENCOUNTER — Other Ambulatory Visit: Payer: Self-pay | Admitting: Family Medicine

## 2024-02-06 ENCOUNTER — Encounter: Payer: Self-pay | Admitting: Family Medicine

## 2024-02-06 DIAGNOSIS — D509 Iron deficiency anemia, unspecified: Secondary | ICD-10-CM

## 2024-02-06 DIAGNOSIS — D649 Anemia, unspecified: Secondary | ICD-10-CM

## 2024-02-06 NOTE — Telephone Encounter (Signed)
 Copied from CRM [id number]. Topic: Referral - Question >> Feb 06, 2024 12:49 PM Berneda F wrote: Reason for CRM: Pt states a referral was sent for an ion infusion but they still have not scheduled her and she would like this to be sent to one of our [facility] facilities instead so she can have this done please.  Patient callback is [phone]

## 2024-02-07 ENCOUNTER — Other Ambulatory Visit: Payer: Self-pay

## 2024-02-07 ENCOUNTER — Telehealth: Payer: Self-pay | Admitting: Family Medicine

## 2024-02-07 ENCOUNTER — Encounter: Payer: Self-pay | Admitting: Family Medicine

## 2024-02-07 ENCOUNTER — Telehealth: Payer: Self-pay

## 2024-02-07 ENCOUNTER — Ambulatory Visit

## 2024-02-07 VITALS — BP 113/77 | HR 75 | Temp 98.2°F | Resp 16 | Ht 65.0 in | Wt 158.0 lb

## 2024-02-07 DIAGNOSIS — D509 Iron deficiency anemia, unspecified: Secondary | ICD-10-CM | POA: Diagnosis not present

## 2024-02-07 MED ORDER — SODIUM CHLORIDE 0.9 % IV SOLN
300.0000 mg | Freq: Once | INTRAVENOUS | Status: AC
  Administered 2024-02-07: 300 mg via INTRAVENOUS
  Filled 2024-02-07: qty 15

## 2024-02-07 NOTE — Progress Notes (Signed)
 Diagnosis: Iron  Deficiency Anemia  Provider:  Praveen Mannam MD  Procedure: IV Infusion  IV Type: Peripheral, IV Location: L Antecubital  Venofer  (Iron  Sucrose), Dose: 300 mg  Infusion Start Time: 1330  Infusion Stop Time: 1515  Post Infusion IV Care: Observation period completed and Peripheral IV Discontinued  Discharge: Condition: Good, Destination: Home . AVS Provided  Performed by:  Maximiano JONELLE Pouch, LPN

## 2024-02-07 NOTE — Telephone Encounter (Signed)
 Patient referred to infusion pharmacy team for ambulatory infusion of IV iron .  Insurance - [employer] Parts of care - Site of care: [facility] Dx code - D50.9 IV Iron  Therapy - Venofer  300 mg x 3 Infusion appointments - Scheduling team will schedule patient as soon as possible.    Norton Blush, PharmD Clinical Pharmacist

## 2024-02-07 NOTE — Telephone Encounter (Signed)
 See other message

## 2024-02-07 NOTE — Telephone Encounter (Signed)
 Auth Submission: NO AUTH NEEDED Site of care: Site of care: [facility] Payer: [employer] Medication & CPT/J Code(s) submitted: Venofer  (Iron  Sucrose) J1756 Diagnosis Code:  Route of submission (phone, fax, portal): portal Phone # Fax # Auth type: Buy/Bill PB Units/visits requested: 300 x 3doses Reference number:  Approval from: 02/07/24 to 08/08/24

## 2024-02-14 ENCOUNTER — Ambulatory Visit

## 2024-02-14 VITALS — BP 116/75 | HR 84 | Temp 98.6°F | Resp 16 | Ht 65.0 in | Wt 160.2 lb

## 2024-02-14 DIAGNOSIS — D509 Iron deficiency anemia, unspecified: Secondary | ICD-10-CM

## 2024-02-14 MED ORDER — SODIUM CHLORIDE 0.9 % IV SOLN
300.0000 mg | Freq: Once | INTRAVENOUS | Status: AC
  Administered 2024-02-14: 300 mg via INTRAVENOUS
  Filled 2024-02-14: qty 15

## 2024-02-14 NOTE — Progress Notes (Signed)
 Diagnosis: Iron  Deficiency Anemia  Provider:  Praveen Mannam MD  Procedure: IV Infusion  IV Type: Peripheral, IV Location: R Antecubital  Venofer  (Iron  Sucrose), Dose: 300 mg  Infusion Start Time: 1021  Infusion Stop Time: 1202  Post Infusion IV Care: Observation period completed and Peripheral IV Discontinued  Discharge: Condition: Good, Destination: Home . AVS Declined  Performed by:  Maximiano JONELLE Pouch, LPN

## 2024-02-21 ENCOUNTER — Ambulatory Visit

## 2024-02-21 VITALS — BP 105/69 | HR 74 | Temp 98.7°F | Resp 20 | Ht 65.0 in | Wt 159.2 lb

## 2024-02-21 DIAGNOSIS — D509 Iron deficiency anemia, unspecified: Secondary | ICD-10-CM

## 2024-02-21 MED ORDER — IRON SUCROSE 300 MG IVPB - SIMPLE MED
300.0000 mg | Freq: Once | Status: AC
  Administered 2024-02-21: 300 mg via INTRAVENOUS
  Filled 2024-02-21: qty 300

## 2024-02-21 NOTE — Progress Notes (Signed)
 Diagnosis: Iron  Deficiency Anemia  Provider:  Praveen Mannam MD  Procedure: IV Infusion  IV Type: Peripheral, IV Location: L Forearm  Venofer  (Iron  Sucrose), Dose: 300 mg  Infusion Start Time: 0951  Infusion Stop Time: 1132  Post Infusion IV Care: Peripheral IV Discontinued .pt declined observation.  Discharge: Condition: Good, Destination: Home . AVS Declined  Performed by:  Catalina Salasar, RN

## 2024-02-22 ENCOUNTER — Encounter: Payer: Self-pay | Admitting: Family Medicine

## 2024-02-24 ENCOUNTER — Ambulatory Visit: Payer: Self-pay

## 2024-02-24 ENCOUNTER — Telehealth (INDEPENDENT_AMBULATORY_CARE_PROVIDER_SITE_OTHER): Admitting: Family Medicine

## 2024-02-24 ENCOUNTER — Ambulatory Visit (HOSPITAL_COMMUNITY)
Admission: RE | Admit: 2024-02-24 | Discharge: 2024-02-24 | Disposition: A | Discharge status: Home / Self Care | Source: Ambulatory Visit | Attending: Family Medicine | Admitting: Family Medicine

## 2024-02-24 ENCOUNTER — Encounter: Payer: Self-pay | Admitting: Family Medicine

## 2024-02-24 VITALS — Ht 65.0 in | Wt 157.0 lb

## 2024-02-24 DIAGNOSIS — M79604 Pain in right leg: Secondary | ICD-10-CM

## 2024-02-24 DIAGNOSIS — Z86718 Personal history of other venous thrombosis and embolism: Secondary | ICD-10-CM | POA: Insufficient documentation

## 2024-02-24 DIAGNOSIS — Z86711 Personal history of pulmonary embolism: Secondary | ICD-10-CM | POA: Insufficient documentation

## 2024-02-24 NOTE — Telephone Encounter (Signed)
Patient was seen 7/18.

## 2024-02-24 NOTE — Progress Notes (Signed)
 Virtual Visit via Video Note  I connected with Melissa Ruiz on 02/24/24 at 1:49 PM by a video enabled telemedicine application and verified that I am speaking with the correct person using two identifiers.   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    I discussed the limitations, risks, security and privacy concerns of performing an evaluation and management service by telephone and the availability of in person appointments. I also discussed with the patient that there may be a patient responsible charge related to this service. The patient expressed understanding and agreed to proceed, consent obtained  Chief Complaint  Patient presents with   Leg Pain    History of Present Illness: Melissa Ruiz is a 38 y.o. female is having intermittent right lower extremity pain. This is pain started about a month or two ago. Mostly daily pain. Usually last minutes. Pain usually occurs with moving around, not so much when resting. Described as a shooting and sharp pain. Pain starts at your right buttock cheek and radiates to her right foot. But, the last two days it has been at the sole of her feet.  Denies swelling in right lower extremity. Denies redness in right extremity. Denies chest pain and SHOB.   She does have a history DVT, and PEs. Also, reports she feels like her pain could be related to sciatica. She as been traveling recently, early June by car and planning to fly next week to [place].   She has took tried Aleve that has seemed to help relief the pain.   Denies any pain in left lower extremity.   Patient Active Problem List   Diagnosis Date Noted   Iron  deficiency anemia 12/22/2023   Acid reflux 12/22/2023   Snoring 07/27/2022   History of gestational diabetes 12/07/2021   Chronic hypertension 12/07/2021   H/O unilateral salpingectomy 11/06/2021   Pelvic adhesive disease 09/12/2021   Anticoagulated 07/27/2021   Mixed  hyperlipidemia 06/08/2021   Endometriosis 01/08/2021   History of pulmonary embolus (PE) 08/12/2017   History of DVT (deep vein thrombosis) 08/12/2017   History of goiter 08/12/2017   Past Medical History:  Diagnosis Date   Anemia    DVT (deep venous thrombosis) (HCC) 2017   Endometriosis 01/08/2021   Essential hypertension 09/16/2014   History of placenta previa 05/14/2021   Not seen on 2022 pregnancy   Hyperlipidemia    Pregnancy resulting from in vitro fertilization, antepartum 04/12/2018   Patient of Dr. Yalcinkaya.  EDD 11/10/2018 by IVF dating.  [x]  fetal echo: normal except known PACs   Pulmonary embolism (HCC) 09/2015   Vitamin D  deficiency 08/12/2017   Past Surgical History:  Procedure Laterality Date   CESAREAN SECTION N/A 08/27/2018   Procedure: CESAREAN SECTION;  Surgeon: Eveline Lynwood MATSU, MD;  Location: [facility];  Service: Obstetrics;  Laterality: N/A;   CESAREAN SECTION N/A 11/06/2021   Procedure: CESAREAN SECTION;  Surgeon: Fredirick Glenys RAMAN, MD;  Location: [facility] LD ORS;  Service: Obstetrics;  Laterality: N/A;   CHOLECYSTECTOMY     DIAGNOSTIC LAPAROSCOPY  11/2020   LOA, right salpingectoy   No Known Allergies Prior to Admission medications   Medication Sig Start Date End Date Taking? Authorizing Provider  atorvastatin  (LIPITOR) 10 MG tablet Take 1 tablet (10 mg total) by mouth daily. 12/12/23  Yes Mercer Clotilda SAUNDERS, MD  cholecalciferol (VITAMIN D3) 25 MCG (1000 UNIT) tablet Take 5,000 Units by mouth daily.   Yes [provider]  famotidine  (PEPCID ) 20 MG tablet Take 1 tablet (20 mg total) by mouth 2 (two) times daily. 02/02/24  Yes Mercer Clotilda SAUNDERS, MD  ferrous gluconate  (FERGON) 324 MG tablet Take 1 tablet (324 mg total) by mouth daily. 12/15/22  Yes Mercer Clotilda SAUNDERS, MD  metoprolol  succinate (TOPROL  XL) 25 MG 24 hr tablet Take 0.5 tablets (12.5 mg total) by mouth daily. 12/12/23  Yes Mercer Clotilda SAUNDERS, MD  terconazole  (TERAZOL 3 ) 80 MG vaginal suppository Place  1 suppository (80 mg total) vaginally at bedtime. 04/23/22  Yes Anyanwu, Gloris LABOR, MD   Social History   Socioeconomic History   Marital status: Married    Spouse name: Not on file   Number of children: Not on file   Years of education: Not on file   Highest education level: Not on file  Occupational History   Not on file  Tobacco Use   Smoking status: Never   Smokeless tobacco: Never  Vaping Use   Vaping status: Never Used  Substance and Sexual Activity   Alcohol use: Not Currently    Alcohol/week: 0.0 standard drinks of alcohol    Comment: rarely   Drug use: No   Sexual activity: Yes    Birth control/protection: None  Other Topics Concern   Not on file  Social History Narrative   Not on file   Social Drivers of Health   Financial Resource Strain: Low Risk  (06/08/2021)   Overall Financial Resource Strain (CARDIA)    Difficulty of Paying Living Expenses: Not hard at all  Food Insecurity: No Food Insecurity (10/29/2021)   Hunger Vital Sign    Worried About Running Out of Food in the Last Year: Never true    Ran Out of Food in the Last Year: Never true  Transportation Needs: No Transportation Needs (10/29/2021)   PRAPARE - Administrator, Civil Service (Medical): No    Lack of Transportation (Non-Medical): No  Physical Activity: Not on file  Stress: No Stress Concern Present (11/21/2020)   Received from [facility] - Occupational Stress Questionnaire    Feeling of Stress : Only a little  Social Connections: Unknown (12/22/2021)   Received from [facility]   Social Network    Social Network: Not on file  Intimate Partner Violence: Unknown (11/13/2021)   Received from [facility]   HITS    Physically Hurt: Not on file    Insult or Talk Down To: Not on file    Threaten Physical Harm: Not on file    Scream or Curse: Not on file    Observations/Objective: Today's Vitals   02/24/24 1309  Weight: 157 lb (71.2  kg)  Height: 5' 5 (1.651 m)   Physical Exam Constitutional:      General: She is not in acute distress.    Appearance: She is not ill-appearing, toxic-appearing or diaphoretic.  HENT:     Head: Normocephalic and atraumatic.  Eyes:     General:        Right eye: No discharge.        Left eye: No discharge.     Conjunctiva/sclera: Conjunctivae normal.  Skin:    General: Skin is dry.  Neurological:     General: No focal deficit present.     Mental Status: She is alert and oriented to person, place, and time. Mental status is at baseline.    Assessment and Plan: History of pulmonary embolus (PE)  History  of DVT (deep vein thrombosis)  Pain of right lower extremity -     US  Venous Img Lower Bilateral (DVT); Future  -Ordered STAT bilateral venous ultrasound of lower extremity to rule out DVT as a reason for pain.  She has a history of DVT and PE. Should receive a call with appointment time for the scan.Patient prefers to have a study completed at 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  -If ultrasound is negative, suspect symptoms are related to sciatica. May benefit from a steroid dose pack.  Follow Up Instructions: If not improved.    I discussed the assessment and treatment plan with the patient. The patient was provided an opportunity to ask questions and all were answered. The patient agreed with the plan and demonstrated an understanding of the instructions.   The patient was advised to call back or seek an in-person evaluation if the symptoms worsen or if the condition fails to improve as anticipated.  Sung Parodi, NP

## 2024-02-24 NOTE — Addendum Note (Signed)
 Addended by: Alcee Sipos R on: 02/24/2024 01:58 PM   Modules accepted: Orders

## 2024-02-24 NOTE — Patient Instructions (Addendum)
-  It was nice to care for you today.  -Ordered STAT bilateral venous ultrasound of lower extremity to rule out DVT as a reason for pain. You should receive a call with appointment time for the scan.  -If ultrasound is negative, suspect symptoms are related to sciatica. May benefit from a steroid dose pack.

## 2024-02-24 NOTE — Telephone Encounter (Signed)
 FYI Only or Action Required?: Action required by provider: clinical question for provider.  Patient was last seen in primary care on 12/12/2023 by Melissa Clotilda SAUNDERS, MD.  Called Nurse Triage reporting Leg Pain.  Symptoms began about a month ago.  Interventions attempted: Nothing.  Symptoms are: gradually worsening.  Triage Disposition: See Physician Within 24 Hours  Patient/caregiver understands and will follow disposition?: Yes    Copied from CRM [id number]. Topic: Clinical - Red Word Triage >> Feb 24, 2024 12:31 PM Larissa S wrote: [facility] that prompted transfer to Nurse Triage: RT leg pain  Reason for Disposition  Numbness in a leg or foot (i.e., loss of sensation)    Radiation of the pain throughout the leg.  Answer Assessment - Initial Assessment Questions 1. ONSET: When did the pain start?      One month  2. LOCATION: Where is the pain located?      Right Leg, around the ankles, gluteal area  3. PAIN: How bad is the pain?    (Scale 1-10; or mild, moderate, severe)     5-9  4. WORK OR EXERCISE: Has there been any recent work or exercise that involved this part of the body?      No  5. CAUSE: What do you think is causing the leg pain?     Believes it is Sciatic Nerve related  6. OTHER SYMPTOMS: Do you have any other symptoms? (e.g., chest pain, back pain, breathing difficulty, swelling, rash, fever, numbness, weakness)     Swelling  7. PREGNANCY: Is there any chance you are pregnant? When was your last menstrual period?     No and No  Protocols used: Leg Pain-A-AH

## 2024-02-27 ENCOUNTER — Ambulatory Visit: Payer: Self-pay | Admitting: Family Medicine

## 2024-02-27 ENCOUNTER — Other Ambulatory Visit: Payer: Self-pay

## 2024-02-27 ENCOUNTER — Other Ambulatory Visit: Payer: Self-pay | Admitting: Family Medicine

## 2024-02-27 DIAGNOSIS — B3731 Acute candidiasis of vulva and vagina: Secondary | ICD-10-CM

## 2024-02-27 DIAGNOSIS — M79604 Pain in right leg: Secondary | ICD-10-CM

## 2024-02-27 MED ORDER — PREDNISONE 10 MG PO TABS
ORAL_TABLET | ORAL | 0 refills | Status: AC
  Filled 2024-02-27: qty 21, 6d supply, fill #0

## 2024-02-27 MED ORDER — FLUCONAZOLE 150 MG PO TABS
ORAL_TABLET | ORAL | 0 refills | Status: AC
  Filled 2024-02-27: qty 2, 3d supply, fill #0

## 2024-02-27 NOTE — Progress Notes (Signed)
 Negative DVT. Suspect symptoms are related to sciatica. Prescribing Prednisone  10mg  tablet, 6 day dose pack. Also, requesting Fluconazole  with concerns of obtain a yeast infection with taking steroid.

## 2024-03-19 ENCOUNTER — Other Ambulatory Visit (HOSPITAL_COMMUNITY): Payer: Self-pay

## 2024-05-04 ENCOUNTER — Other Ambulatory Visit: Payer: Self-pay

## 2024-05-04 ENCOUNTER — Other Ambulatory Visit (HOSPITAL_COMMUNITY): Payer: Self-pay

## 2024-05-04 ENCOUNTER — Encounter: Payer: Self-pay | Admitting: Pharmacist

## 2024-05-07 ENCOUNTER — Other Ambulatory Visit: Payer: Self-pay

## 2024-05-07 ENCOUNTER — Other Ambulatory Visit (HOSPITAL_COMMUNITY): Payer: Self-pay

## 2024-05-08 ENCOUNTER — Other Ambulatory Visit: Payer: Self-pay

## 2024-06-12 ENCOUNTER — Other Ambulatory Visit: Payer: Self-pay

## 2024-08-07 ENCOUNTER — Other Ambulatory Visit: Payer: Self-pay
